# Patient Record
Sex: Female | Born: 1970
Health system: Southern US, Community
[De-identification: ages and names within clinical notes are randomized; demographics above are authoritative.]

## PROBLEM LIST (undated history)

## (undated) DIAGNOSIS — Z8711 Personal history of peptic ulcer disease: Secondary | ICD-10-CM

## (undated) DIAGNOSIS — I10 Essential (primary) hypertension: Secondary | ICD-10-CM

## (undated) DIAGNOSIS — N920 Excessive and frequent menstruation with regular cycle: Secondary | ICD-10-CM

## (undated) DIAGNOSIS — G43909 Migraine, unspecified, not intractable, without status migrainosus: Secondary | ICD-10-CM

## (undated) DIAGNOSIS — R7303 Prediabetes: Secondary | ICD-10-CM

## (undated) DIAGNOSIS — F419 Anxiety disorder, unspecified: Secondary | ICD-10-CM

## (undated) DIAGNOSIS — F32A Depression, unspecified: Secondary | ICD-10-CM

## (undated) DIAGNOSIS — E039 Hypothyroidism, unspecified: Secondary | ICD-10-CM

## (undated) DIAGNOSIS — K259 Gastric ulcer, unspecified as acute or chronic, without hemorrhage or perforation: Secondary | ICD-10-CM

## (undated) DIAGNOSIS — Z973 Presence of spectacles and contact lenses: Secondary | ICD-10-CM

## (undated) DIAGNOSIS — E785 Hyperlipidemia, unspecified: Secondary | ICD-10-CM

## (undated) DIAGNOSIS — R030 Elevated blood-pressure reading, without diagnosis of hypertension: Secondary | ICD-10-CM

## (undated) DIAGNOSIS — R351 Nocturia: Secondary | ICD-10-CM

## (undated) DIAGNOSIS — F329 Major depressive disorder, single episode, unspecified: Secondary | ICD-10-CM

## (undated) DIAGNOSIS — K219 Gastro-esophageal reflux disease without esophagitis: Secondary | ICD-10-CM

## (undated) HISTORY — DX: Essential (primary) hypertension: I10

## (undated) HISTORY — DX: Anxiety disorder, unspecified: F41.9

## (undated) HISTORY — PX: CHOLECYSTECTOMY: SHX55

## (undated) HISTORY — PX: TUBAL LIGATION: SHX77

## (undated) HISTORY — DX: Hyperlipidemia, unspecified: E78.5

## (undated) HISTORY — PX: EYE SURGERY: SHX253

## (undated) HISTORY — PX: ABDOMINAL HYSTERECTOMY: SHX81

## (undated) HISTORY — DX: Depression, unspecified: F32.A

## (undated) HISTORY — DX: Major depressive disorder, single episode, unspecified: F32.9

## (undated) HISTORY — DX: Gastro-esophageal reflux disease without esophagitis: K21.9

## (undated) HISTORY — DX: Migraine, unspecified, not intractable, without status migrainosus: G43.909

---

## 2003-05-08 ENCOUNTER — Emergency Department (HOSPITAL_COMMUNITY): Admission: EM | Admit: 2003-05-08 | Discharge: 2003-05-08 | Payer: Self-pay | Admitting: Emergency Medicine

## 2003-05-08 ENCOUNTER — Encounter: Payer: Self-pay | Admitting: Emergency Medicine

## 2006-01-22 ENCOUNTER — Other Ambulatory Visit: Admission: RE | Admit: 2006-01-22 | Discharge: 2006-01-22 | Payer: Self-pay | Admitting: Obstetrics & Gynecology

## 2011-12-11 HISTORY — PX: WISDOM TOOTH EXTRACTION: SHX21

## 2012-02-28 ENCOUNTER — Other Ambulatory Visit: Payer: Self-pay | Admitting: Obstetrics & Gynecology

## 2012-02-28 DIAGNOSIS — R928 Other abnormal and inconclusive findings on diagnostic imaging of breast: Secondary | ICD-10-CM

## 2012-02-29 ENCOUNTER — Other Ambulatory Visit: Payer: Self-pay | Admitting: Gynecology

## 2012-02-29 DIAGNOSIS — R928 Other abnormal and inconclusive findings on diagnostic imaging of breast: Secondary | ICD-10-CM

## 2012-03-05 ENCOUNTER — Other Ambulatory Visit: Payer: Self-pay

## 2012-03-05 ENCOUNTER — Ambulatory Visit
Admission: RE | Admit: 2012-03-05 | Discharge: 2012-03-05 | Disposition: A | Discharge status: Home / Self Care | Payer: Federal, State, Local not specified - PPO | Source: Ambulatory Visit | Attending: Obstetrics & Gynecology | Admitting: Obstetrics & Gynecology

## 2012-03-05 DIAGNOSIS — R928 Other abnormal and inconclusive findings on diagnostic imaging of breast: Secondary | ICD-10-CM

## 2014-02-19 ENCOUNTER — Ambulatory Visit (INDEPENDENT_AMBULATORY_CARE_PROVIDER_SITE_OTHER): Payer: Federal, State, Local not specified - PPO | Admitting: Internal Medicine

## 2014-02-19 ENCOUNTER — Encounter: Payer: Self-pay | Admitting: Internal Medicine

## 2014-02-19 VITALS — BP 132/94 | HR 76 | Temp 98.1°F | Ht 64.0 in | Wt 208.0 lb

## 2014-02-19 DIAGNOSIS — F419 Anxiety disorder, unspecified: Secondary | ICD-10-CM

## 2014-02-19 DIAGNOSIS — F32A Depression, unspecified: Secondary | ICD-10-CM | POA: Insufficient documentation

## 2014-02-19 DIAGNOSIS — E039 Hypothyroidism, unspecified: Secondary | ICD-10-CM

## 2014-02-19 DIAGNOSIS — F329 Major depressive disorder, single episode, unspecified: Secondary | ICD-10-CM | POA: Insufficient documentation

## 2014-02-19 DIAGNOSIS — F411 Generalized anxiety disorder: Secondary | ICD-10-CM

## 2014-02-19 DIAGNOSIS — K219 Gastro-esophageal reflux disease without esophagitis: Secondary | ICD-10-CM

## 2014-02-19 MED ORDER — ALPRAZOLAM 0.5 MG PO TABS: 0.5000 mg | ORAL_TABLET | Freq: Four times a day (QID) | ORAL | Status: DC | PRN

## 2014-02-19 MED ORDER — OMEPRAZOLE 20 MG PO CPDR: 20.0000 mg | DELAYED_RELEASE_CAPSULE | Freq: Every day | ORAL | Status: DC

## 2014-02-19 MED ORDER — LEVOTHYROXINE SODIUM 50 MCG PO TABS: 75.0000 ug | ORAL_TABLET | Freq: Every day | ORAL | Status: DC

## 2014-02-19 MED ORDER — FLUOXETINE HCL 20 MG PO TABS: 20.0000 mg | ORAL_TABLET | Freq: Every day | ORAL | Status: DC

## 2014-02-19 NOTE — Assessment & Plan Note (Signed)
Stress related Does have history of stress related ulcers Will try prilosec daily

## 2014-02-19 NOTE — Progress Notes (Signed)
Pre visit review using our clinic review tool, if applicable. No additional management support is needed unless otherwise documented below in the visit note. 

## 2014-02-19 NOTE — Assessment & Plan Note (Signed)
TSH just checked 12/2013 Will obtain records for review Synthroid reviewed today

## 2014-02-19 NOTE — Progress Notes (Signed)
HPI  Pt presents to the clinic today to establish care. She is transferring care from [facility], Dr. Milly Jakob. She does request medication refills today. She does have some concerns about increasing anxiety. She does take xanax daily but does not like the way it makes her feel groggy. She is wondering if she needs to be on some other type of medications. She was on tofranil in the past with good results.  Flu: never Tetanus: 2013 LMP:02/06/14 Pap Smear: 08/2013 Mammogram: 03/2013 Eye Doctor: as needed Dentist: biannually  Past Medical History  Diagnosis Date  . Chicken pox   . Depression   . Migraines   . Thyroid disease   . Anxiety   . GERD (gastroesophageal reflux disease)   . Hypertension   . Hyperlipidemia   . History of stomach ulcers     Current Outpatient Prescriptions  Medication Sig Dispense Refill  . ALPRAZolam (XANAX) 0.5 MG tablet Take 0.5 mg by mouth 4 (four) times daily as needed for anxiety.      . fluticasone (FLONASE) 50 MCG/ACT nasal spray Place 2 sprays into both nostrils daily.      Marland Kitchen levothyroxine (SYNTHROID, LEVOTHROID) 50 MCG tablet Take 75 mcg by mouth daily before breakfast. 1 and 1/2 tablets daily      . mometasone-formoterol (DULERA) 200-5 MCG/ACT AERO Inhale 2 puffs into the lungs 2 (two) times daily as needed for wheezing.      . norethindrone (MICRONOR,CAMILA,ERRIN) 0.35 MG tablet Take 1 tablet by mouth daily.       No current facility-administered medications for this visit.    Allergies  Allergen Reactions  . Azithromycin Hives    Family History  Problem Relation Age of Onset  . Mental illness Mother   . Arthritis Father   . Hyperlipidemia Father   . Hypertension Father   . Diabetes Father   . Arthritis Maternal Grandmother   . Diabetes Maternal Grandmother   . Arthritis Maternal Grandfather   . Diabetes Maternal Grandfather   . Arthritis Paternal Grandmother   . Breast cancer Paternal Grandmother     History   Social History   . Marital Status: Married    Spouse Name: N/A    Number of Children: N/A  . Years of Education: N/A   Occupational History  . Not on file.   Social History Main Topics  . Smoking status: Never Smoker   . Smokeless tobacco: Not on file  . Alcohol Use: Yes     Comment: occasional  . Drug Use: No  . Sexual Activity: Not on file   Other Topics Concern  . Not on file   Social History Narrative  . No narrative on file    ROS:  Constitutional: Denies fever, malaise, fatigue, headache or abrupt weight changes.  Respiratory: Denies difficulty breathing, shortness of breath, cough or sputum production.   Cardiovascular: Denies chest pain, chest tightness, palpitations or swelling in the hands or feet.  Gastrointestinal: Pt reports reflux. Denies abdominal pain, bloating, constipation, diarrhea or blood in the stool.  Psych: Pt reports anxiety. Denies depression, SI/HI.  No other specific complaints in a complete review of systems (except as listed in HPI above).  PE:  BP 132/94  Pulse 76  Temp(Src) 98.1 F (36.7 C) (Oral)  Ht 5\' 4"  (1.626 m)  Wt 208 lb (94.348 kg)  BMI 35.69 kg/m2  SpO2 98%  LMP 02/06/2014 Wt Readings from Last 3 Encounters:  02/19/14 208 lb (94.348 kg)    General:  Appears her stated age, overweight but well developed, well nourished in NAD. Cardiovascular: Normal rate and rhythm. S1,S2 noted.  No murmur, rubs or gallops noted. No JVD or BLE edema. No carotid bruits noted. Pulmonary/Chest: Normal effort and positive vesicular breath sounds. No respiratory distress. No wheezes, rales or ronchi noted.  Abdomen: Soft and nontender. Normal bowel sounds, no bruits noted. No distention or masses noted. Liver, spleen and kidneys non palpable. Psychiatric: Mood tearful and affect normal. Behavior is normal. Judgment and thought content normal.      Assessment and Plan:

## 2014-02-19 NOTE — Assessment & Plan Note (Signed)
Seems to be getting worse Needing to take xanax daily Will trial prozac daily  RTC in 6 weeks to follow up anxiety

## 2014-02-19 NOTE — Patient Instructions (Addendum)

## 2014-03-23 ENCOUNTER — Other Ambulatory Visit: Payer: Self-pay | Admitting: *Deleted

## 2014-03-23 ENCOUNTER — Encounter: Payer: Self-pay | Admitting: Podiatry

## 2014-03-23 MED ORDER — NAPROXEN SODIUM 550 MG PO TABS: 550.0000 mg | ORAL_TABLET | Freq: Two times a day (BID) | ORAL | Status: DC

## 2014-03-23 NOTE — Telephone Encounter (Signed)
[facility] faxed request for naproxen sodium 550 mg tab #60 with 1 refill. Per dr Milinda Pointer refill rx.

## 2014-04-02 ENCOUNTER — Encounter: Payer: Self-pay | Admitting: Internal Medicine

## 2014-04-02 ENCOUNTER — Ambulatory Visit (INDEPENDENT_AMBULATORY_CARE_PROVIDER_SITE_OTHER): Payer: Federal, State, Local not specified - PPO | Admitting: Internal Medicine

## 2014-04-02 VITALS — BP 120/80 | HR 70 | Temp 98.2°F | Wt 204.0 lb

## 2014-04-02 DIAGNOSIS — F411 Generalized anxiety disorder: Secondary | ICD-10-CM

## 2014-04-02 DIAGNOSIS — G4700 Insomnia, unspecified: Secondary | ICD-10-CM | POA: Insufficient documentation

## 2014-04-02 DIAGNOSIS — E039 Hypothyroidism, unspecified: Secondary | ICD-10-CM

## 2014-04-02 MED ORDER — FLUOXETINE HCL 20 MG PO TABS: 20.0000 mg | ORAL_TABLET | Freq: Every day | ORAL | Status: DC

## 2014-04-02 MED ORDER — TRAZODONE HCL 50 MG PO TABS: 25.0000 mg | ORAL_TABLET | Freq: Every evening | ORAL | Status: DC | PRN

## 2014-04-02 NOTE — Assessment & Plan Note (Signed)
Well controlled on prozac Infrequent xanax use Medication refilled today

## 2014-04-02 NOTE — Patient Instructions (Addendum)

## 2014-04-02 NOTE — Progress Notes (Signed)
Subjective:    Patient ID: Anne Mcguire, female    DOB: 29-Sep-1971, 43 y.o.   MRN: [id number]  HPI  Pt presents to the clinic today for 6 week followup for anxiety. She was previously on xanax, but did not like the way it made her feel. She felt groggy. She was started on prozac daily and instructed to use the xanax as needed. She reports that she feels better. She feels more relaxed and less irritable. She is having some issues sleeping. She falls asleep fine but has trouble staying asleep. She has tried [place] in the past and does not want to try that again.  Additionally, she is do to have her thyroid level checked. Her dose was adjusted in December to 75 mcg daily and she has not had it checked since that time.  Review of Systems      Past Medical History  Diagnosis Date  . Chicken pox   . Depression   . Migraines   . Thyroid disease   . Anxiety   . GERD (gastroesophageal reflux disease)   . Hypertension   . Hyperlipidemia   . History of stomach ulcers     Current Outpatient Prescriptions  Medication Sig Dispense Refill  . ALPRAZolam (XANAX) 0.5 MG tablet Take 1 tablet (0.5 mg total) by mouth 4 (four) times daily as needed for anxiety.  30 tablet  0  . FLUoxetine (PROZAC) 20 MG tablet Take 1 tablet (20 mg total) by mouth daily.  30 tablet  1  . fluticasone (FLONASE) 50 MCG/ACT nasal spray Place 2 sprays into both nostrils daily.      Marland Kitchen levothyroxine (SYNTHROID, LEVOTHROID) 50 MCG tablet Take 1.5 tablets (75 mcg total) by mouth daily before breakfast. 1 and 1/2 tablets daily  135 tablet  1  . mometasone-formoterol (DULERA) 200-5 MCG/ACT AERO Inhale 2 puffs into the lungs 2 (two) times daily as needed for wheezing.      . naproxen sodium (ANAPROX) 550 MG tablet Take 1 tablet (550 mg total) by mouth 2 (two) times daily with a meal.  60 tablet  1  . norethindrone (MICRONOR,CAMILA,ERRIN) 0.35 MG tablet Take 1 tablet by mouth daily.      Marland Kitchen omeprazole (PRILOSEC) 20 MG capsule  Take 1 capsule (20 mg total) by mouth daily.  30 capsule  3   No current facility-administered medications for this visit.    Allergies  Allergen Reactions  . Azithromycin Hives    Family History  Problem Relation Age of Onset  . Mental illness Mother   . Arthritis Father   . Hyperlipidemia Father   . Hypertension Father   . Diabetes Father   . Arthritis Maternal Grandmother   . Diabetes Maternal Grandmother   . Arthritis Maternal Grandfather   . Diabetes Maternal Grandfather   . Arthritis Paternal Grandmother   . Breast cancer Paternal Grandmother     History   Social History  . Marital Status: Married    Spouse Name: N/A    Number of Children: N/A  . Years of Education: N/A   Occupational History  . Not on file.   Social History Main Topics  . Smoking status: Never Smoker   . Smokeless tobacco: Not on file  . Alcohol Use: Yes     Comment: occasional  . Drug Use: No  . Sexual Activity: Yes   Other Topics Concern  . Not on file   Social History Narrative  . No narrative on file  Constitutional: Denies fever, malaise, fatigue, headache or abrupt weight changes.  Psych: Pt reports insomnia, anxiety and depression. Denies SI/HI.  No other specific complaints in a complete review of systems (except as listed in HPI above).  Objective:   Physical Exam   BP 120/80  Pulse 70  Temp(Src) 98.2 F (36.8 C) (Oral)  Wt 204 lb (92.534 kg)  SpO2 98% Wt Readings from Last 3 Encounters:  04/02/14 204 lb (92.534 kg)  02/19/14 208 lb (94.348 kg)    General: Appears her stated age, well developed, well nourished in NAD. Cardiovascular: Normal rate and rhythm. S1,S2 noted.  No murmur, rubs or gallops noted. No JVD or BLE edema. No carotid bruits noted. Pulmonary/Chest: Normal effort and positive vesicular breath sounds. No respiratory distress. No wheezes, rales or ronchi noted.  Psychiatric: Mood and affect normal. Behavior is normal. Judgment and thought  content normal.         Assessment & Plan:

## 2014-04-02 NOTE — Assessment & Plan Note (Signed)
Will recheck TSH today

## 2014-04-02 NOTE — Progress Notes (Signed)
Pre visit review using our clinic review tool, if applicable. No additional management support is needed unless otherwise documented below in the visit note. 

## 2014-04-02 NOTE — Assessment & Plan Note (Signed)
Will try low dose trazadone

## 2014-04-05 LAB — TSH: TSH: 0.61 u[IU]/mL (ref 0.35–5.50)

## 2014-04-13 ENCOUNTER — Other Ambulatory Visit: Payer: Self-pay | Admitting: Internal Medicine

## 2014-04-29 ENCOUNTER — Other Ambulatory Visit: Payer: Self-pay | Admitting: Internal Medicine

## 2014-04-29 NOTE — Telephone Encounter (Signed)
Last filled 03/30/14--please advise

## 2014-05-24 ENCOUNTER — Other Ambulatory Visit: Payer: Self-pay | Admitting: Internal Medicine

## 2014-05-25 NOTE — Telephone Encounter (Signed)
Last filled 04/29/14.  Ok to refill?

## 2014-06-16 ENCOUNTER — Other Ambulatory Visit: Payer: Self-pay | Admitting: Internal Medicine

## 2014-08-10 ENCOUNTER — Other Ambulatory Visit: Payer: Self-pay | Admitting: Internal Medicine

## 2014-09-02 ENCOUNTER — Other Ambulatory Visit: Payer: Self-pay | Admitting: Internal Medicine

## 2014-09-02 NOTE — Telephone Encounter (Signed)
Last filled 05/25/14 with 2 refills-- pt has f/u appt 10/15--please advise

## 2014-09-18 ENCOUNTER — Other Ambulatory Visit: Payer: Self-pay | Admitting: Internal Medicine

## 2014-09-20 NOTE — Telephone Encounter (Signed)
Pt has an upcoming appt with you but it seems she should have ran out already--unless she had leftover--appt scheduled 10/04/14--please advise if okay to refill before f/u appt

## 2014-10-04 ENCOUNTER — Ambulatory Visit (INDEPENDENT_AMBULATORY_CARE_PROVIDER_SITE_OTHER): Payer: Federal, State, Local not specified - PPO | Admitting: Internal Medicine

## 2014-10-04 ENCOUNTER — Encounter: Payer: Self-pay | Admitting: Internal Medicine

## 2014-10-04 VITALS — BP 138/86 | HR 81 | Temp 98.6°F | Wt 211.0 lb

## 2014-10-04 DIAGNOSIS — E039 Hypothyroidism, unspecified: Secondary | ICD-10-CM

## 2014-10-04 DIAGNOSIS — Z23 Encounter for immunization: Secondary | ICD-10-CM

## 2014-10-04 DIAGNOSIS — F411 Generalized anxiety disorder: Secondary | ICD-10-CM

## 2014-10-04 DIAGNOSIS — G4700 Insomnia, unspecified: Secondary | ICD-10-CM

## 2014-10-04 DIAGNOSIS — R5383 Other fatigue: Secondary | ICD-10-CM

## 2014-10-04 LAB — VITAMIN D 25 HYDROXY (VIT D DEFICIENCY, FRACTURES): VITD: 26.22 ng/mL — ABNORMAL LOW (ref 30.00–100.00)

## 2014-10-04 LAB — COMPREHENSIVE METABOLIC PANEL
ALT: 13 U/L (ref 0–35)
AST: 17 U/L (ref 0–37)
Albumin: 3.6 g/dL (ref 3.5–5.2)
Alkaline Phosphatase: 51 U/L (ref 39–117)
BUN: 12 mg/dL (ref 6–23)
CO2: 32 mEq/L (ref 19–32)
Calcium: 8.9 mg/dL (ref 8.4–10.5)
Chloride: 102 mEq/L (ref 96–112)
Creatinine, Ser: 0.9 mg/dL (ref 0.4–1.2)
GFR: 77.7 mL/min (ref >60.00)
Glucose, Bld: 93 mg/dL (ref 70–99)
Potassium: 3.9 mEq/L (ref 3.5–5.1)
Sodium: 138 mEq/L (ref 135–145)
Total Bilirubin: 0.4 mg/dL (ref 0.2–1.2)
Total Protein: 7.3 g/dL (ref 6.0–8.3)

## 2014-10-04 LAB — TSH: TSH: 0.55 u[IU]/mL (ref 0.35–4.50)

## 2014-10-04 LAB — VITAMIN B12: Vitamin B-12: 360 pg/mL (ref 211–911)

## 2014-10-04 LAB — CBC
HCT: 41.8 % (ref 36.0–46.0)
Hemoglobin: 13.7 g/dL (ref 12.0–15.0)
MCHC: 32.7 g/dL (ref 30.0–36.0)
MCV: 90.3 fl (ref 78.0–100.0)
Platelets: 164 10*3/uL (ref 150.0–400.0)
RBC: 4.63 Mil/uL (ref 3.87–5.11)
RDW: 14.5 % (ref 11.5–15.5)
WBC: 7.3 10*3/uL (ref 4.0–10.5)

## 2014-10-04 LAB — T4, FREE: Free T4: 1.08 ng/dL (ref 0.60–1.60)

## 2014-10-04 NOTE — Progress Notes (Signed)
Subjective:    Patient ID: Anne Mcguire, female    DOB: 1971-11-11, 43 y.o.   MRN: [id number]  HPI  Pt presents to the clinic today for 6 month follow.  Hypothyroidism: She reports that she has been taking 100 mcg daily instead of the 75 mcg prescribed. She has been doing this for the last 2 months. She has been more fatigued and noted more hair loss.  Insomnia: She reports that the trazadone is working well for her.  Anxiety: Controlled on prozac. She reports that she has not had to used the xanax .  Additionally, she reports some pain on the right side of her tongue. She noticed this 2 days ago. It is tender. She denies fever, chills or URI symptoms. She has not tried anything OTC.   Review of Systems      Past Medical History  Diagnosis Date  . Chicken pox   . Depression   . Migraines   . Thyroid disease   . Anxiety   . GERD (gastroesophageal reflux disease)   . Hypertension   . Hyperlipidemia   . History of stomach ulcers     Current Outpatient Prescriptions  Medication Sig Dispense Refill  . FLUoxetine (PROZAC) 20 MG tablet TAKE 1 TABLET BY MOUTH EVERY DAY  30 tablet  4  . fluticasone (FLONASE) 50 MCG/ACT nasal spray Place 2 sprays into both nostrils daily.      Marland Kitchen levothyroxine (SYNTHROID, LEVOTHROID) 50 MCG tablet TAKE 1.5 TABLETS (75 MCG TOTAL) BY MOUTH DAILY BEFORE BREAKFAST.  135 tablet  0  . naproxen sodium (ANAPROX) 550 MG tablet Take 1 tablet (550 mg total) by mouth 2 (two) times daily with a meal.  60 tablet  1  . norethindrone (MICRONOR,CAMILA,ERRIN) 0.35 MG tablet Take 1 tablet by mouth daily.      Marland Kitchen omeprazole (PRILOSEC) 20 MG capsule TAKE 1 CAPSULE (20 MG TOTAL) BY MOUTH DAILY.  30 capsule  3  . traZODone (DESYREL) 50 MG tablet TAKE 1/2 TO 1 TABLET BY MOUTH AT BEDTIME AS NEEDED FOR SLEEP  30 tablet  2   No current facility-administered medications for this visit.    Allergies  Allergen Reactions  . Azithromycin Hives    Family History    Problem Relation Age of Onset  . Mental illness Mother   . Arthritis Father   . Hyperlipidemia Father   . Hypertension Father   . Diabetes Father   . Arthritis Maternal Grandmother   . Diabetes Maternal Grandmother   . Arthritis Maternal Grandfather   . Diabetes Maternal Grandfather   . Arthritis Paternal Grandmother   . Breast cancer Paternal Grandmother     History   Social History  . Marital Status: Married    Spouse Name: N/A    Number of Children: N/A  . Years of Education: N/A   Occupational History  . Not on file.   Social History Main Topics  . Smoking status: Never Smoker   . Smokeless tobacco: Not on file  . Alcohol Use: Yes     Comment: occasional  . Drug Use: No  . Sexual Activity: Yes   Other Topics Concern  . Not on file   Social History Narrative  . No narrative on file     Constitutional: Pt reports fatigue. Denies fever, malaise, headache or abrupt weight changes.  HEENT: Pt reports ulcer in mouth. Denies eye pain, eye redness, ear pain, ringing in the ears, wax buildup, runny nose, nasal congestion,  bloody nose, or sore throat. Respiratory: Denies difficulty breathing, shortness of breath, cough or sputum production.   Cardiovascular: Denies chest pain, chest tightness, palpitations or swelling in the hands or feet.  Neurological: Denies dizziness, difficulty with memory, difficulty with speech or problems with balance and coordination.   No other specific complaints in a complete review of systems (except as listed in HPI above).  Objective:   Physical Exam   BP 138/86  Pulse 81  Temp(Src) 98.6 F (37 C) (Oral)  Wt 211 lb (95.709 kg)  SpO2 98% Wt Readings from Last 3 Encounters:  10/04/14 211 lb (95.709 kg)  04/02/14 204 lb (92.534 kg)  02/19/14 208 lb (94.348 kg)    General: Appears her stated age, obese but well developed, well nourished in NAD. Skin: Warm, dry and intact. No rashes, lesions or ulcerations noted. HEENT:  Throat/Mouth: Teeth present, mucosa pink and moist, no exudate noted. 2 < 1 cm annular ulcerations noted on right lateral surface of tongue. No adenopathy noted. Cardiovascular: Normal rate and rhythm. S1,S2 noted.  No murmur, rubs or gallops noted.  Pulmonary/Chest: Normal effort and positive vesicular breath sounds. No respiratory distress. No wheezes, rales or ronchi noted.  Neurological: Alert and oriented.      Assessment & Plan:   Oral ulcers:  She declines RX for viscous lidocaine Advised her to gargle with salt water TID and prn Reassured her that this is self limiting and should heal within 5-7 days She does have an appt with her dentist in 1 week, will discuss with him if not resolved  Fatigue:  Checking a thyroid panel- will adjust if needed Will check CBC, B12 and Vit D as well

## 2014-10-04 NOTE — Patient Instructions (Addendum)
Hypothyroidism The thyroid is a large gland located in the lower front of your neck. The thyroid gland helps control metabolism. Metabolism is how your body handles food. It controls metabolism with the hormone thyroxine. When this gland is underactive (hypothyroid), it produces too little hormone.  CAUSES These include:   Absence or destruction of thyroid tissue.  Goiter due to iodine deficiency.  Goiter due to medications.  Congenital defects (since birth).  Problems with the pituitary. This causes a lack of TSH (thyroid stimulating hormone). This hormone tells the thyroid to turn out more hormone. SYMPTOMS  Lethargy (feeling as though you have no energy)  Cold intolerance  Weight gain (in spite of normal food intake)  Dry skin  Coarse hair  Menstrual irregularity (if severe, may lead to infertility)  Slowing of thought processes Cardiac problems are also caused by insufficient amounts of thyroid hormone. Hypothyroidism in the newborn is cretinism, and is an extreme form. It is important that this form be treated adequately and immediately or it will lead rapidly to retarded physical and mental development. DIAGNOSIS  To prove hypothyroidism, your caregiver may do blood tests and ultrasound tests. Sometimes the signs are hidden. It may be necessary for your caregiver to watch this illness with blood tests either before or after diagnosis and treatment. TREATMENT  Low levels of thyroid hormone are increased by using synthetic thyroid hormone. This is a safe, effective treatment. It usually takes about four weeks to gain the full effects of the medication. After you have the full effect of the medication, it will generally take another four weeks for problems to leave. Your caregiver may start you on low doses. If you have had heart problems the dose may be gradually increased. It is generally not an emergency to get rapidly to normal. HOME CARE INSTRUCTIONS   Take your  medications as your caregiver suggests. Let your caregiver know of any medications you are taking or start taking. Your caregiver will help you with dosage schedules.  As your condition improves, your dosage needs may increase. It will be necessary to have continuing blood tests as suggested by your caregiver.  Report all suspected medication side effects to your caregiver. SEEK MEDICAL CARE IF: Seek medical care if you develop:  Sweating.  Tremulousness (tremors).  Anxiety.  Rapid weight loss.  Heat intolerance.  Emotional swings.  Diarrhea.  Weakness. SEEK IMMEDIATE MEDICAL CARE IF:  You develop chest pain, an irregular heart beat (palpitations), or a rapid heart beat. MAKE SURE YOU:   Understand these instructions.  Will watch your condition.  Will get help right away if you are not doing well or get worse. Document Released: 11/26/2005 Document Revised: 02/18/2012 Document Reviewed: 07/16/2008 ExitCare Patient Information 2015 [employer]. This information is not intended to replace advice given to you by your health care provider. Make sure you discuss any questions you have with your health care provider.  

## 2014-10-04 NOTE — Assessment & Plan Note (Signed)
Improved on trazadone Will continue current dose at this time

## 2014-10-04 NOTE — Assessment & Plan Note (Signed)
Stable on prozac Does not use xanax Will check CMET today

## 2014-10-04 NOTE — Assessment & Plan Note (Signed)
Has been taking 100 mcg of synthroid Will repeat TSH and free T4 today If normal, will call in 100 mcg capsules with 5 refills If abnormal, will adjust as needed

## 2014-10-04 NOTE — Progress Notes (Signed)
Pre visit review using our clinic review tool, if applicable. No additional management support is needed unless otherwise documented below in the visit note. 

## 2014-10-08 MED ORDER — LEVOTHYROXINE SODIUM 100 MCG PO TABS: 100.0000 ug | ORAL_TABLET | Freq: Every day | ORAL | Status: DC

## 2014-10-08 NOTE — Addendum Note (Signed)
Addended by: Lurlean Nanny on: 10/08/2014 09:10 AM   Modules accepted: Orders

## 2014-10-13 ENCOUNTER — Telehealth: Payer: Self-pay | Admitting: *Deleted

## 2014-10-13 ENCOUNTER — Other Ambulatory Visit: Payer: Self-pay

## 2014-10-13 MED ORDER — OMEPRAZOLE 20 MG PO CPDR: 20.0000 mg | DELAYED_RELEASE_CAPSULE | Freq: Every day | ORAL | Status: DC

## 2014-10-13 NOTE — Telephone Encounter (Signed)
Left msg on triage stating have been trying to get pt omeprazole filled. Has fax twice no response back from office.../lmb  Pt last saw [facility] @ [place]. Forwarding msg to [place]...Johny Chess

## 2014-10-13 NOTE — Telephone Encounter (Signed)
OK to send in omeprazole

## 2014-10-15 NOTE — Telephone Encounter (Signed)
Rx sent through [facility]  

## 2014-11-12 ENCOUNTER — Other Ambulatory Visit: Payer: Self-pay | Admitting: Internal Medicine

## 2014-11-12 ENCOUNTER — Other Ambulatory Visit: Payer: Self-pay

## 2014-11-12 MED ORDER — TRAZODONE HCL 50 MG PO TABS: 25.0000 mg | ORAL_TABLET | Freq: Every evening | ORAL | Status: DC | PRN

## 2014-11-12 NOTE — Telephone Encounter (Signed)
Last filled 09/03/14 with 2 refills please advise

## 2014-11-12 NOTE — Telephone Encounter (Signed)
Last filled 09/04/2014--last OV 09/2014--please advise

## 2015-02-09 ENCOUNTER — Other Ambulatory Visit: Payer: Self-pay | Admitting: Internal Medicine

## 2015-03-07 ENCOUNTER — Ambulatory Visit (INDEPENDENT_AMBULATORY_CARE_PROVIDER_SITE_OTHER): Payer: Federal, State, Local not specified - PPO | Admitting: Family Medicine

## 2015-03-07 ENCOUNTER — Encounter: Payer: Self-pay | Admitting: Family Medicine

## 2015-03-07 VITALS — BP 152/100 | HR 81 | Temp 98.0°F | Ht 64.0 in | Wt 222.8 lb

## 2015-03-07 DIAGNOSIS — R300 Dysuria: Secondary | ICD-10-CM

## 2015-03-07 DIAGNOSIS — IMO0001 Reserved for inherently not codable concepts without codable children: Secondary | ICD-10-CM

## 2015-03-07 DIAGNOSIS — N12 Tubulo-interstitial nephritis, not specified as acute or chronic: Secondary | ICD-10-CM | POA: Diagnosis not present

## 2015-03-07 DIAGNOSIS — R350 Frequency of micturition: Secondary | ICD-10-CM

## 2015-03-07 LAB — POCT URINALYSIS DIPSTICK
Bilirubin, UA: NEGATIVE
Blood, UA: NEGATIVE
Glucose, UA: NEGATIVE
Ketones, UA: NEGATIVE
Leukocytes, UA: NEGATIVE
Nitrite, UA: POSITIVE
Protein, UA: NEGATIVE
Spec Grav, UA: 1.025
Urobilinogen, UA: 2
pH, UA: 6

## 2015-03-07 MED ORDER — CIPROFLOXACIN HCL 500 MG PO TABS: 500.0000 mg | ORAL_TABLET | Freq: Two times a day (BID) | ORAL | Status: DC

## 2015-03-07 NOTE — Progress Notes (Signed)
Pre visit review using our clinic review tool, if applicable. No additional management support is needed unless otherwise documented below in the visit note. 

## 2015-03-07 NOTE — Progress Notes (Signed)
Dr. Frederico Hamman T. Kariel Skillman, MD, [facility] [place], [address] Phone: [phone] Fax: [phone]  03/07/2015  Patient: Anne Mcguire, MRN: [id number], DOB: Sep 07, 1971, 44 y.o.  Primary Physician:  Webb Silversmith, NP  Chief Complaint: Urinary Tract Infection  Subjective:   This 44 y.o. female patient presents with burning, urgency. No vaginal discharge or external irritation.  No STD exposure. No abd pain, no flank pain.  Urgency x 1 week Back pain now on the left  The PMH, PSH, Social History, Family History, Medications, and allergies have been reviewed in [facility], and have been updated if relevant.  GEN:  no fevers, chills. GI: No n/v/d, eating normally Otherwise, ROS is as per the HPI.  Objective:   Blood pressure 152/100, pulse 81, temperature 98 F (36.7 C), temperature source Oral, height 5\' 4"  (1.626 m), weight 222 lb 12.8 oz (101.061 kg), last menstrual period 02/16/2015, SpO2 95 %.  GEN: WDWN, A&Ox4,NAD. Non-toxic HEENT: Atraumatc, normocephalic. CV: RRR, No M/G/R PULM: CTA B, No wheezes, crackles, or rhonchi ABD: S, NT, ND, +BS, no rebound. + L CVAT. + suprapubic tenderness. EXT: No c/c/e  Objective Data: Results for orders placed or performed in visit on 03/07/15  POCT urinalysis dipstick  Result Value Ref Range   Color, UA Orange    Clarity, UA clear    Glucose, UA negative    Bilirubin, UA negative    Ketones, UA negative    Spec Grav, UA 1.025    Blood, UA negative    pH, UA 6.0    Protein, UA negative    Urobilinogen, UA 2.0    Nitrite, UA positive    Leukocytes, UA Negative     Assessment and Plan:   Pyelonephritis - Plan: ciprofloxacin (CIPRO) 500 MG tablet, Urine culture  Frequency - Plan: POCT urinalysis dipstick  Burning with urination - Plan: POCT urinalysis dipstick  Probable early pyelo  Follow-up: Return if symptoms worsen or fail to improve.  New Prescriptions     CIPROFLOXACIN (CIPRO) 500 MG TABLET    Take 1 tablet (500 mg total) by mouth 2 (two) times daily.   Orders Placed This Encounter  Procedures  . Urine culture  . POCT urinalysis dipstick    Signed,  Frederico Hamman T. Aliz Meritt, MD   Patient's Medications  New Prescriptions   CIPROFLOXACIN (CIPRO) 500 MG TABLET    Take 1 tablet (500 mg total) by mouth 2 (two) times daily.  Previous Medications   B COMPLEX-C (SUPER B COMPLEX/VITAMIN C PO)    Take 1 capsule by mouth daily.   FERROUS GLUCONATE (FERGON) 240 (27 FE) MG TABLET    Take 240 mg by mouth daily.   FLUOXETINE (PROZAC) 20 MG TABLET    TAKE 1 TABLET BY MOUTH EVERY DAY   FLUTICASONE (FLONASE) 50 MCG/ACT NASAL SPRAY    Place 2 sprays into both nostrils daily.   LEVOTHYROXINE (SYNTHROID, LEVOTHROID) 100 MCG TABLET    Take 1 tablet (100 mcg total) by mouth daily before breakfast.   NAPROXEN SODIUM (ANAPROX) 550 MG TABLET    Take 1 tablet (550 mg total) by mouth 2 (two) times daily with a meal.   NORETHINDRONE (MICRONOR,CAMILA,ERRIN) 0.35 MG TABLET    Take 1 tablet by mouth daily.   OMEPRAZOLE (PRILOSEC) 20 MG CAPSULE    Take 1 capsule (20 mg total) by mouth daily.   TRAZODONE (DESYREL) 50 MG TABLET    TAKE 1/2 TO  1 TABLET BY MOUTH AT BEDTIME AS NEEDED FOR SLEEP  Modified Medications   No medications on file  Discontinued Medications   No medications on file

## 2015-03-08 ENCOUNTER — Telehealth: Payer: Self-pay | Admitting: Internal Medicine

## 2015-03-08 LAB — URINE CULTURE
Colony Count: NO GROWTH
Organism ID, Bacteria: NO GROWTH

## 2015-03-08 NOTE — Telephone Encounter (Signed)
Patient Name: Anne Mcguire  DOB: 23-May-1971    Initial Comment Caller states she has an UTI yesterday and today she has some pain and swelling in the groin area.    Nurse Assessment  Nurse: Thad Ranger RN, Denise Date/Time (Eastern Time): 03/08/2015 4:11:08 PM  Confirm and document reason for call. If symptomatic, describe symptoms. ---Caller states she has a UTI and was seen at [facility] yesterday and placed on Cipro. States today she has some pain and swelling femoral lymph node on the right side. NO fever and has had a total of Cipro x 3 doses. Has not taken Ibuprophen.  Has the patient traveled out of the country within the last 30 days? ---Not Applicable  Does the patient require triage? ---Yes  Related visit to physician within the last 2 weeks? ---Yes  Does the PT have any chronic conditions? (i.e. diabetes, asthma, etc.) ---No  Did the patient indicate they were pregnant? ---No     Guidelines    Guideline Title Affirmed Question Affirmed Notes  Lymph Nodes Swollen [1] Very tender to the touch AND [2] no fever Based on RN clinical inst, advised if she is not feeling any better after being on Cipro x 72 hrs for UTI, she is to be seen by MD. Verb understanding.   Final Disposition User   See PCP When Office is Open (within 3 days) Carmon, RN, Langley Gauss    Comments  No appt scheduled yet as she want to wait to see if the Cipro improves her s/s of UTI and Lymph node pain/swelling. States she will cb if her s/s do not improve and will make appt at that time.

## 2015-03-11 ENCOUNTER — Telehealth: Payer: Self-pay | Admitting: *Deleted

## 2015-03-11 NOTE — Telephone Encounter (Signed)
Patient advised.

## 2015-03-11 NOTE — Telephone Encounter (Signed)
It's possible but not at all common.  If mild, no weakness, and not progressive, then okay to continue.   Dr. Lorelei Pont was worried about an infection, and if possible I would try to finish the abx.   If bothersome, then stop the medicine and update [place] in about 2-3 days (and especially let us know if her urinary sx aren't improved).   Thanks.

## 2015-03-11 NOTE — Telephone Encounter (Signed)
Pt calls c/o numbness and tingling pain in legs since starting Cipro 5 days ago. She want's to know if it is a possible side effect?

## 2015-03-14 ENCOUNTER — Encounter: Payer: Self-pay | Admitting: Internal Medicine

## 2015-03-14 ENCOUNTER — Ambulatory Visit (INDEPENDENT_AMBULATORY_CARE_PROVIDER_SITE_OTHER): Payer: Federal, State, Local not specified - PPO | Admitting: Internal Medicine

## 2015-03-14 ENCOUNTER — Ambulatory Visit: Payer: Federal, State, Local not specified - PPO | Admitting: Internal Medicine

## 2015-03-14 VITALS — BP 146/88 | HR 81 | Temp 98.4°F | Wt 222.0 lb

## 2015-03-14 DIAGNOSIS — M545 Low back pain, unspecified: Secondary | ICD-10-CM

## 2015-03-14 DIAGNOSIS — R195 Other fecal abnormalities: Secondary | ICD-10-CM

## 2015-03-14 DIAGNOSIS — R351 Nocturia: Secondary | ICD-10-CM

## 2015-03-14 DIAGNOSIS — R350 Frequency of micturition: Secondary | ICD-10-CM

## 2015-03-14 DIAGNOSIS — T148XXA Other injury of unspecified body region, initial encounter: Secondary | ICD-10-CM

## 2015-03-14 DIAGNOSIS — R3915 Urgency of urination: Secondary | ICD-10-CM | POA: Diagnosis not present

## 2015-03-14 DIAGNOSIS — M7630 Iliotibial band syndrome, unspecified leg: Secondary | ICD-10-CM | POA: Diagnosis not present

## 2015-03-14 DIAGNOSIS — T148 Other injury of unspecified body region: Secondary | ICD-10-CM

## 2015-03-14 LAB — POCT URINALYSIS DIPSTICK
Bilirubin, UA: NEGATIVE
Blood, UA: NEGATIVE
Glucose, UA: NEGATIVE
Ketones, UA: NEGATIVE
Leukocytes, UA: NEGATIVE
Nitrite, UA: NEGATIVE
Protein, UA: NEGATIVE
Spec Grav, UA: 1.02
Urobilinogen, UA: NEGATIVE
pH, UA: 6

## 2015-03-14 MED ORDER — CYCLOBENZAPRINE HCL 5 MG PO TABS: 5.0000 mg | ORAL_TABLET | Freq: Every day | ORAL | Status: DC

## 2015-03-14 MED ORDER — PREDNISONE 10 MG PO TABS: ORAL_TABLET | ORAL | Status: DC

## 2015-03-14 NOTE — Progress Notes (Signed)
Subjective:    Patient ID: Anne Mcguire, female    DOB: 1971-10-18, 44 y.o.   MRN: [id number]  HPI  Pt presents to the clinic today with multiple complaints:  1- She has had urgency, frequency and nocturia x 2-3 months. Last week she developed right side back pain and dysuria in addition to her other symptoms. She was diagnosed with Pyelonephritis and treated with Cipro. She reports her dysuria went away but her back pain did not. Her urine culture showed no growth. She has not had any fever, chills or body aches. She does drink caffeine during the day but only water/juice after 7 pm. She does drink fluids up until the time she goes to bed.  2- Ever since she started on the antibiotic, she has been having pain on the sides of both of her legs. She describes the pain as sore and prickly. She denies numbness or tingling. She denies any injury to the area. She has not tried anything OTC.   3- She also c/o loose stool. This started 2-3 months ago as well. She is having a bowel movement daily but reports she is not emptying all the way. She feels constipated and bloated. She denies reflux but is taking Prilosec daily. She denies blood in her stool. She has tried Milk of Magnesium but it seemed to make her symptoms worse.  Review of Systems      Past Medical History  Diagnosis Date  . Chicken pox   . Depression   . Migraines   . Thyroid disease   . Anxiety   . GERD (gastroesophageal reflux disease)   . Hypertension   . Hyperlipidemia   . History of stomach ulcers     Current Outpatient Prescriptions  Medication Sig Dispense Refill  . B Complex-C (SUPER B COMPLEX/VITAMIN C PO) Take 1 capsule by mouth daily.    . ciprofloxacin (CIPRO) 500 MG tablet Take 1 tablet (500 mg total) by mouth 2 (two) times daily. 14 tablet 0  . ferrous gluconate (FERGON) 240 (27 FE) MG tablet Take 240 mg by mouth daily.    Marland Kitchen FLUoxetine (PROZAC) 20 MG tablet TAKE 1 TABLET BY MOUTH EVERY DAY 30 tablet 1    . fluticasone (FLONASE) 50 MCG/ACT nasal spray Place 2 sprays into both nostrils daily.    Marland Kitchen levothyroxine (SYNTHROID, LEVOTHROID) 100 MCG tablet Take 1 tablet (100 mcg total) by mouth daily before breakfast. 30 tablet 5  . naproxen sodium (ANAPROX) 550 MG tablet Take 1 tablet (550 mg total) by mouth 2 (two) times daily with a meal. 60 tablet 1  . norethindrone (MICRONOR,CAMILA,ERRIN) 0.35 MG tablet Take 1 tablet by mouth daily.    Marland Kitchen omeprazole (PRILOSEC) 20 MG capsule Take 1 capsule (20 mg total) by mouth daily. 30 capsule 5  . traZODone (DESYREL) 50 MG tablet TAKE 1/2 TO 1 TABLET BY MOUTH AT BEDTIME AS NEEDED FOR SLEEP 30 tablet 1   No current facility-administered medications for this visit.    Allergies  Allergen Reactions  . Azithromycin Hives    Family History  Problem Relation Age of Onset  . Mental illness Mother   . Arthritis Father   . Hyperlipidemia Father   . Hypertension Father   . Diabetes Father   . Arthritis Maternal Grandmother   . Diabetes Maternal Grandmother   . Arthritis Maternal Grandfather   . Diabetes Maternal Grandfather   . Arthritis Paternal Grandmother   . Breast cancer Paternal Grandmother  History   Social History  . Marital Status: Married    Spouse Name: N/A  . Number of Children: N/A  . Years of Education: N/A   Occupational History  . Not on file.   Social History Main Topics  . Smoking status: Never Smoker   . Smokeless tobacco: Not on file  . Alcohol Use: Yes     Comment: occasional  . Drug Use: No  . Sexual Activity: Yes   Other Topics Concern  . Not on file   Social History Narrative     Constitutional: Pt reports fatigue. Denies fever, malaise, fatigue, headache or abrupt weight changes.  Respiratory: Denies difficulty breathing, shortness of breath, cough or sputum production.   Cardiovascular: Denies chest pain, chest tightness, palpitations or swelling in the hands or feet.  Gastrointestinal: Pt reports diarrhea  and bloating. Denies abdominal pain, constipation, or blood in the stool.  GU: Pt reports urgency, frequency. Denies pain with urination, burning sensation, blood in urine, odor or discharge. Musculoskeletal: Pt reports back pain. Denies decrease in range of motion, difficulty with gait, or joint pain and swelling.   No other specific complaints in a complete review of systems (except as listed in HPI above).  Objective:   Physical Exam  BP 146/88 mmHg  Pulse 81  Temp(Src) 98.4 F (36.9 C) (Oral)  Wt 222 lb (100.699 kg)  SpO2 98%  LMP 02/16/2015 Wt Readings from Last 3 Encounters:  03/14/15 222 lb (100.699 kg)  03/07/15 222 lb 12.8 oz (101.061 kg)  10/04/14 211 lb (95.709 kg)    General: Appears her stated age, obese in NAD. Skin: Warm, dry and intact. No rashes, lesions or ulcerations noted. Cardiovascular: Normal rate and rhythm. S1,S2 noted.  No murmur, rubs or gallops noted.  Pulmonary/Chest: Normal effort and positive vesicular breath sounds. No respiratory distress. No wheezes, rales or ronchi noted.  Abdomen: Soft and nontender. Normal bowel sounds, no bruits noted. No distention or masses noted.  Musculoskeletal: Normal flexion, extension of rotation of spine. Pain in the right lower back with ROM. Pain with palpation of the lumbar spine and right lower back. Strength 5/5 BLE. No difficulty with gait.  Neurological: Alert and oriented. Sensation intact to BLE.   BMET    Component Value Date/Time   NA 138 10/04/2014 0922   K 3.9 10/04/2014 0922   CL 102 10/04/2014 0922   CO2 32 10/04/2014 0922   GLUCOSE 93 10/04/2014 0922   BUN 12 10/04/2014 0922   CREATININE 0.9 10/04/2014 0922   CALCIUM 8.9 10/04/2014 0922    Lipid Panel  No results found for: CHOL, TRIG, HDL, CHOLHDL, VLDL, LDLCALC  CBC    Component Value Date/Time   WBC 7.3 10/04/2014 0922   RBC 4.63 10/04/2014 0922   HGB 13.7 10/04/2014 0922   HCT 41.8 10/04/2014 0922   PLT 164.0 10/04/2014 0922    MCV 90.3 10/04/2014 0922   MCHC 32.7 10/04/2014 0922   RDW 14.5 10/04/2014 0922    Hgb A1C No results found for: HGBA1C       Assessment & Plan:   Urinary urgency, frequency and nocturia:  I thinks this is likely related to OAB versus Cystitis Urinalysis: normal Urine culture from last week reviewed- no growth Advised her to cut back on the caffeine and try to not drink fluids after 7 pm Offered to check for diabetes, she does want this but wants to wait until her   IT band tendonitis:  Could have been  caused by Cipro- she has already stopped this eRx for Pred Taper x 6 days   Low back pain with muscle strain of right lower back:  The prednisone given for the tendonitis should help eRx for Flexeril 5 mg to take at night if pain is severe Stretching exercises given If no improvement, will consider xray of lumbar spine  Loose stool:  Advised her to try a Probiotic OTC to see if this helps Will reevaluate at your physical exam  RTC in 1 month for your physical exam, sooner if needed

## 2015-03-14 NOTE — Addendum Note (Signed)
Addended by: Lurlean Nanny on: 03/14/2015 04:42 PM   Modules accepted: Orders

## 2015-03-14 NOTE — Patient Instructions (Signed)
Back Exercises These exercises may help you when beginning to rehabilitate your injury. Your symptoms may resolve with or without further involvement from your physician, physical therapist or athletic trainer. While completing these exercises, remember:   Restoring tissue flexibility helps normal motion to return to the joints. This allows healthier, less painful movement and activity.  An effective stretch should be held for at least 30 seconds.  A stretch should never be painful. You should only feel a gentle lengthening or release in the stretched tissue. STRETCH - Extension, Prone on Elbows   Lie on your stomach on the floor, a bed will be too soft. Place your palms about shoulder width apart and at the height of your head.  Place your elbows under your shoulders. If this is too painful, stack pillows under your chest.  Allow your body to relax so that your hips drop lower and make contact more completely with the floor.  Hold this position for __________ seconds.  Slowly return to lying flat on the floor. Repeat __________ times. Complete this exercise __________ times per day.  RANGE OF MOTION - Extension, Prone Press Ups   Lie on your stomach on the floor, a bed will be too soft. Place your palms about shoulder width apart and at the height of your head.  Keeping your back as relaxed as possible, slowly straighten your elbows while keeping your hips on the floor. You may adjust the placement of your hands to maximize your comfort. As you gain motion, your hands will come more underneath your shoulders.  Hold this position __________ seconds.  Slowly return to lying flat on the floor. Repeat __________ times. Complete this exercise __________ times per day.  RANGE OF MOTION- Quadruped, Neutral Spine   Assume a hands and knees position on a firm surface. Keep your hands under your shoulders and your knees under your hips. You may place padding under your knees for  comfort.  Drop your head and point your tail bone toward the ground below you. This will round out your low back like an angry cat. Hold this position for __________ seconds.  Slowly lift your head and release your tail bone so that your back sags into a large arch, like an old horse.  Hold this position for __________ seconds.  Repeat this until you feel limber in your low back.  Now, find your "sweet spot." This will be the most comfortable position somewhere between the two previous positions. This is your neutral spine. Once you have found this position, tense your stomach muscles to support your low back.  Hold this position for __________ seconds. Repeat __________ times. Complete this exercise __________ times per day.  STRETCH - Flexion, Single Knee to Chest   Lie on a firm bed or floor with both legs extended in front of you.  Keeping one leg in contact with the floor, bring your opposite knee to your chest. Hold your leg in place by either grabbing behind your thigh or at your knee.  Pull until you feel a gentle stretch in your low back. Hold __________ seconds.  Slowly release your grasp and repeat the exercise with the opposite side. Repeat __________ times. Complete this exercise __________ times per day.  STRETCH - Hamstrings, Standing  Stand or sit and extend your right / left leg, placing your foot on a chair or foot stool  Keeping a slight arch in your low back and your hips straight forward.  Lead with your chest and   lean forward at the waist until you feel a gentle stretch in the back of your right / left knee or thigh. (When done correctly, this exercise requires leaning only a small distance.)  Hold this position for __________ seconds. Repeat __________ times. Complete this stretch __________ times per day. STRENGTHENING - Deep Abdominals, Pelvic Tilt   Lie on a firm bed or floor. Keeping your legs in front of you, bend your knees so they are both pointed  toward the ceiling and your feet are flat on the floor.  Tense your lower abdominal muscles to press your low back into the floor. This motion will rotate your pelvis so that your tail bone is scooping upwards rather than pointing at your feet or into the floor.  With a gentle tension and even breathing, hold this position for __________ seconds. Repeat __________ times. Complete this exercise __________ times per day.  STRENGTHENING - Abdominals, Crunches   Lie on a firm bed or floor. Keeping your legs in front of you, bend your knees so they are both pointed toward the ceiling and your feet are flat on the floor. Cross your arms over your chest.  Slightly tip your chin down without bending your neck.  Tense your abdominals and slowly lift your trunk high enough to just clear your shoulder blades. Lifting higher can put excessive stress on the low back and does not further strengthen your abdominal muscles.  Control your return to the starting position. Repeat __________ times. Complete this exercise __________ times per day.  STRENGTHENING - Quadruped, Opposite UE/LE Lift   Assume a hands and knees position on a firm surface. Keep your hands under your shoulders and your knees under your hips. You may place padding under your knees for comfort.  Find your neutral spine and gently tense your abdominal muscles so that you can maintain this position. Your shoulders and hips should form a rectangle that is parallel with the floor and is not twisted.  Keeping your trunk steady, lift your right hand no higher than your shoulder and then your left leg no higher than your hip. Make sure you are not holding your breath. Hold this position __________ seconds.  Continuing to keep your abdominal muscles tense and your back steady, slowly return to your starting position. Repeat with the opposite arm and leg. Repeat __________ times. Complete this exercise __________ times per day. Document Released:  12/14/2005 Document Revised: 02/18/2012 Document Reviewed: 03/10/2009 Exit[employer] Patient Information 2015 [employer]. This information is not intended to replace advice given to you by your health care provider. Make sure you discuss any questions you have with your health care provider.  

## 2015-03-15 ENCOUNTER — Other Ambulatory Visit: Payer: Self-pay

## 2015-03-15 MED ORDER — OMEPRAZOLE 20 MG PO CPDR: 20.0000 mg | DELAYED_RELEASE_CAPSULE | Freq: Every day | ORAL | Status: DC

## 2015-03-24 ENCOUNTER — Other Ambulatory Visit: Payer: Self-pay | Admitting: Internal Medicine

## 2015-03-24 DIAGNOSIS — Z0000 Encounter for general adult medical examination without abnormal findings: Secondary | ICD-10-CM

## 2015-03-27 ENCOUNTER — Other Ambulatory Visit: Payer: Self-pay | Admitting: Internal Medicine

## 2015-03-29 ENCOUNTER — Other Ambulatory Visit: Payer: Federal, State, Local not specified - PPO

## 2015-04-05 ENCOUNTER — Ambulatory Visit (INDEPENDENT_AMBULATORY_CARE_PROVIDER_SITE_OTHER): Payer: Federal, State, Local not specified - PPO | Admitting: Internal Medicine

## 2015-04-05 ENCOUNTER — Encounter: Payer: Self-pay | Admitting: Internal Medicine

## 2015-04-05 VITALS — BP 136/84 | HR 84 | Temp 98.2°F | Ht 64.0 in | Wt 222.0 lb

## 2015-04-05 DIAGNOSIS — H6981 Other specified disorders of Eustachian tube, right ear: Secondary | ICD-10-CM | POA: Diagnosis not present

## 2015-04-05 DIAGNOSIS — Z0000 Encounter for general adult medical examination without abnormal findings: Secondary | ICD-10-CM

## 2015-04-05 DIAGNOSIS — G4700 Insomnia, unspecified: Secondary | ICD-10-CM

## 2015-04-05 DIAGNOSIS — F411 Generalized anxiety disorder: Secondary | ICD-10-CM | POA: Diagnosis not present

## 2015-04-05 LAB — LIPID PANEL
Cholesterol: 188 mg/dL (ref 0–200)
HDL: 48.2 mg/dL (ref >39.00)
LDL Cholesterol: 113 mg/dL — ABNORMAL HIGH (ref 0–99)
NonHDL: 139.8
Total CHOL/HDL Ratio: 4
Triglycerides: 136 mg/dL (ref 0.0–149.0)
VLDL: 27.2 mg/dL (ref 0.0–40.0)

## 2015-04-05 LAB — COMPREHENSIVE METABOLIC PANEL
ALT: 10 U/L (ref 0–35)
AST: 12 U/L (ref 0–37)
Albumin: 4 g/dL (ref 3.5–5.2)
Alkaline Phosphatase: 53 U/L (ref 39–117)
BUN: 16 mg/dL (ref 6–23)
CO2: 31 mEq/L (ref 19–32)
Calcium: 9 mg/dL (ref 8.4–10.5)
Chloride: 103 mEq/L (ref 96–112)
Creatinine, Ser: 0.7 mg/dL (ref 0.40–1.20)
GFR: 96.98 mL/min (ref >60.00)
Glucose, Bld: 103 mg/dL — ABNORMAL HIGH (ref 70–99)
Potassium: 4.2 mEq/L (ref 3.5–5.1)
Sodium: 138 mEq/L (ref 135–145)
Total Bilirubin: 0.3 mg/dL (ref 0.2–1.2)
Total Protein: 6.6 g/dL (ref 6.0–8.3)

## 2015-04-05 LAB — CBC
HCT: 40.7 % (ref 36.0–46.0)
Hemoglobin: 13.6 g/dL (ref 12.0–15.0)
MCHC: 33.5 g/dL (ref 30.0–36.0)
MCV: 89.2 fl (ref 78.0–100.0)
Platelets: 137 10*3/uL — ABNORMAL LOW (ref 150.0–400.0)
RBC: 4.56 Mil/uL (ref 3.87–5.11)
RDW: 14.5 % (ref 11.5–15.5)
WBC: 7.9 10*3/uL (ref 4.0–10.5)

## 2015-04-05 LAB — T4, FREE: Free T4: 0.9 ng/dL (ref 0.60–1.60)

## 2015-04-05 LAB — HEMOGLOBIN A1C: Hgb A1c MFr Bld: 5.9 % (ref 4.6–6.5)

## 2015-04-05 LAB — TSH: TSH: 1.04 u[IU]/mL (ref 0.35–4.50)

## 2015-04-05 MED ORDER — FLUOXETINE HCL 20 MG PO TABS: 20.0000 mg | ORAL_TABLET | Freq: Every day | ORAL | Status: DC

## 2015-04-05 MED ORDER — TRAZODONE HCL 50 MG PO TABS: 25.0000 mg | ORAL_TABLET | Freq: Every evening | ORAL | Status: DC | PRN

## 2015-04-05 NOTE — Assessment & Plan Note (Signed)
Good control on Trazadone Will check CMET today Trazadone refilled today

## 2015-04-05 NOTE — Addendum Note (Signed)
Addended by: Ellamae Sia on: 04/05/2015 08:47 AM   Modules accepted: Orders

## 2015-04-05 NOTE — Progress Notes (Signed)
Subjective:    Patient ID: Anne Mcguire, female    DOB: Aug 09, 1971, 44 y.o.   MRN: [id number]  HPI  Pt presents to the clinic today for her annual exam.  Flu: 09/2014 Tetanus: 2013 LMP: 03/16/15 Pap Smear: 09/2014 Mammogram: 02/2014 Vision Screening: 08/2014 at [facility] Dentist: biannually  She also c/o pain in her right ear. This started 1 week ago. She reports the pain is sharp and stabbing at time. She denies fever, chills or body aches.  She denies hearing loss. It has kept her from sleeping a few nights this week. She has tried Ibuprofen and has put Peroxide in it with some relief.  She also is in need of her Prozac and Trazadone. The Prozac is very effective for her anxiety disorder. She takes it daily without side effect. She has had more trouble sleeping over the last week, despite taking her Trazadone. She thinks it may be related to her ear pain, because the Trazadone typically works very well for her.  Review of Systems      Past Medical History  Diagnosis Date  . Chicken pox   . Depression   . Migraines   . Thyroid disease   . Anxiety   . GERD (gastroesophageal reflux disease)   . Hypertension   . Hyperlipidemia   . History of stomach ulcers     Current Outpatient Prescriptions  Medication Sig Dispense Refill  . B Complex-C (SUPER B COMPLEX/VITAMIN C PO) Take 1 capsule by mouth daily.    . ciprofloxacin (CIPRO) 500 MG tablet Take 1 tablet (500 mg total) by mouth 2 (two) times daily. 14 tablet 0  . cyclobenzaprine (FLEXERIL) 5 MG tablet Take 1 tablet (5 mg total) by mouth at bedtime. 20 tablet 0  . ferrous gluconate (FERGON) 240 (27 FE) MG tablet Take 240 mg by mouth daily.    Marland Kitchen FLUoxetine (PROZAC) 20 MG tablet TAKE 1 TABLET BY MOUTH EVERY DAY 30 tablet 1  . fluticasone (FLONASE) 50 MCG/ACT nasal spray Place 2 sprays into both nostrils daily.    Marland Kitchen levothyroxine (SYNTHROID, LEVOTHROID) 100 MCG tablet TAKE 1 TABLET (100 MCG TOTAL) BY MOUTH DAILY BEFORE  BREAKFAST. 30 tablet 1  . naproxen sodium (ANAPROX) 550 MG tablet Take 1 tablet (550 mg total) by mouth 2 (two) times daily with a meal. 60 tablet 1  . norethindrone (MICRONOR,CAMILA,ERRIN) 0.35 MG tablet Take 1 tablet by mouth daily.    Marland Kitchen omeprazole (PRILOSEC) 20 MG capsule Take 1 capsule (20 mg total) by mouth daily. 30 capsule 5  . predniSONE (DELTASONE) 10 MG tablet Take 6 tabs day 1, 5 tabs day 2, 4 tabs day 3, 3 tabs day 4, 2 tabs day 5, 1 tab day 6 21 tablet 0  . traZODone (DESYREL) 50 MG tablet TAKE 1/2 TO 1 TABLET BY MOUTH AT BEDTIME AS NEEDED FOR SLEEP 30 tablet 1   No current facility-administered medications for this visit.    Allergies  Allergen Reactions  . Azithromycin Hives    Family History  Problem Relation Age of Onset  . Mental illness Mother   . Arthritis Father   . Hyperlipidemia Father   . Hypertension Father   . Diabetes Father   . Arthritis Maternal Grandmother   . Diabetes Maternal Grandmother   . Arthritis Maternal Grandfather   . Diabetes Maternal Grandfather   . Arthritis Paternal Grandmother   . Breast cancer Paternal Grandmother     History   Social History  .  Marital Status: Married    Spouse Name: N/A  . Number of Children: N/A  . Years of Education: N/A   Occupational History  . Not on file.   Social History Main Topics  . Smoking status: Never Smoker   . Smokeless tobacco: Not on file  . Alcohol Use: Yes     Comment: occasional  . Drug Use: No  . Sexual Activity: Yes   Other Topics Concern  . Not on file   Social History Narrative     Constitutional: Denies fever, malaise, fatigue, headache or abrupt weight changes.  HEENT: Pt reports right ear pain. Denies eye pain, eye redness, ringing in the ears, wax buildup, runny nose, nasal congestion, bloody nose, or sore throat. Respiratory: Denies difficulty breathing, shortness of breath, cough or sputum production.   Cardiovascular: Denies chest pain, chest tightness,  palpitations or swelling in the hands or feet.  Gastrointestinal: Pt reports constipation. Denies abdominal pain, bloating, diarrhea or blood in the stool.  GU: Denies urgency, frequency, pain with urination, burning sensation, blood in urine, odor or discharge. Musculoskeletal: Denies decrease in range of motion, difficulty with gait, muscle pain or joint pain and swelling.  Skin: Denies redness, rashes, lesions or ulcercations.  Neurological: Denies dizziness, difficulty with memory, difficulty with speech or problems with balance and coordination.  Psych: Pt has history of anxiety. Denies depression, SI/HI.  No other specific complaints in a complete review of systems (except as listed in HPI above).  Objective:   Physical Exam   BP 136/84 mmHg  Pulse 84  Temp(Src) 98.2 F (36.8 C) (Oral)  Ht 5\' 4"  (1.626 m)  Wt 222 lb (100.699 kg)  BMI 38.09 kg/m2  SpO2 98%  LMP 03/16/2015 Wt Readings from Last 3 Encounters:  04/05/15 222 lb (100.699 kg)  03/14/15 222 lb (100.699 kg)  03/07/15 222 lb 12.8 oz (101.061 kg)    General: Appears her stated age, obese in NAD. Skin: Warm, dry and intact. No rashes, lesions or ulcerations noted. HEENT: Head: normal shape and size; Eyes: sclera white, no icterus, conjunctiva pink, PERRLA and EOMs intact; Ears: Tm's gray and intact, normal light reflex, + effusion on the right; Nose: mucosa pink and moist, septum midline; Throat/Mouth: Teeth present, mucosa pink and moist, no exudate, lesions or ulcerations noted.  Neck: Neck supple, trachea midline. No masses, lumps or thyromegaly present.  Cardiovascular: Normal rate and rhythm. S1,S2 noted.  No murmur, rubs or gallops noted. No JVD or BLE edema. No carotid bruits noted. Pulmonary/Chest: Normal effort and positive vesicular breath sounds. No respiratory distress. No wheezes, rales or ronchi noted.  Abdomen: Soft and nontender. Normal bowel sounds, no bruits noted. No distention or masses noted. Liver,  spleen and kidneys non palpable. Musculoskeletal: Strength 5/5 BUE/BLE. No signs of joint swelling. No difficulty with gait.  Neurological: Alert and oriented. Cranial nerves II-XII grossly intact. Coordination normal.  Psychiatric: Mood slightly anxious but affect normal. Behavior is normal. Judgment and thought content normal.     BMET    Component Value Date/Time   NA 138 10/04/2014 0922   K 3.9 10/04/2014 0922   CL 102 10/04/2014 0922   CO2 32 10/04/2014 0922   GLUCOSE 93 10/04/2014 0922   BUN 12 10/04/2014 0922   CREATININE 0.9 10/04/2014 0922   CALCIUM 8.9 10/04/2014 0922    Lipid Panel  No results found for: CHOL, TRIG, HDL, CHOLHDL, VLDL, LDLCALC  CBC    Component Value Date/Time   WBC 7.3 10/04/2014  0922   RBC 4.63 10/04/2014 0922   HGB 13.7 10/04/2014 0922   HCT 41.8 10/04/2014 0922   PLT 164.0 10/04/2014 0922   MCV 90.3 10/04/2014 0922   MCHC 32.7 10/04/2014 0922   RDW 14.5 10/04/2014 0922    Hgb A1C No results found for: HGBA1C      Assessment & Plan:   Preventative Health Maintenance:  Will check CBC, CMET, Lipid, A1C, TSH and Free T4 Encouraged her to work on diet and exercise All HM UTD  ETD, right:  Advised her to try Flonase in right nare daily x 1-2 weeks If no improvement, she should let me know Ok to continue Ibuprofen to help with pain  RTC in 6 months to follow up chronic condtions

## 2015-04-05 NOTE — Patient Instructions (Signed)

## 2015-04-05 NOTE — Progress Notes (Signed)
Pre visit review using our clinic review tool, if applicable. No additional management support is needed unless otherwise documented below in the visit note. 

## 2015-04-05 NOTE — Assessment & Plan Note (Signed)
Well controlled on Prozac Will check CMET today Prozac refilled today

## 2015-05-24 ENCOUNTER — Other Ambulatory Visit: Payer: Self-pay | Admitting: Internal Medicine

## 2015-09-27 ENCOUNTER — Other Ambulatory Visit: Payer: Self-pay | Admitting: Internal Medicine

## 2015-10-05 ENCOUNTER — Ambulatory Visit: Payer: Federal, State, Local not specified - PPO | Admitting: Internal Medicine

## 2015-10-06 ENCOUNTER — Encounter: Payer: Self-pay | Admitting: Internal Medicine

## 2015-10-06 ENCOUNTER — Ambulatory Visit (INDEPENDENT_AMBULATORY_CARE_PROVIDER_SITE_OTHER): Payer: Federal, State, Local not specified - PPO | Admitting: Internal Medicine

## 2015-10-06 VITALS — BP 132/92 | HR 84 | Temp 98.4°F | Wt 223.0 lb

## 2015-10-06 DIAGNOSIS — F411 Generalized anxiety disorder: Secondary | ICD-10-CM

## 2015-10-06 DIAGNOSIS — K219 Gastro-esophageal reflux disease without esophagitis: Secondary | ICD-10-CM

## 2015-10-06 DIAGNOSIS — G4700 Insomnia, unspecified: Secondary | ICD-10-CM

## 2015-10-06 DIAGNOSIS — E039 Hypothyroidism, unspecified: Secondary | ICD-10-CM | POA: Diagnosis not present

## 2015-10-06 LAB — COMPREHENSIVE METABOLIC PANEL
ALT: 10 U/L (ref 0–35)
AST: 13 U/L (ref 0–37)
Albumin: 4 g/dL (ref 3.5–5.2)
Alkaline Phosphatase: 58 U/L (ref 39–117)
BUN: 13 mg/dL (ref 6–23)
CO2: 29 mEq/L (ref 19–32)
Calcium: 9.3 mg/dL (ref 8.4–10.5)
Chloride: 102 mEq/L (ref 96–112)
Creatinine, Ser: 0.77 mg/dL (ref 0.40–1.20)
GFR: 86.68 mL/min (ref >60.00)
Glucose, Bld: 109 mg/dL — ABNORMAL HIGH (ref 70–99)
Potassium: 4.1 mEq/L (ref 3.5–5.1)
Sodium: 139 mEq/L (ref 135–145)
Total Bilirubin: 0.4 mg/dL (ref 0.2–1.2)
Total Protein: 7 g/dL (ref 6.0–8.3)

## 2015-10-06 LAB — TSH: TSH: 0.44 u[IU]/mL (ref 0.35–4.50)

## 2015-10-06 LAB — CBC
HCT: 41.4 % (ref 36.0–46.0)
Hemoglobin: 13.7 g/dL (ref 12.0–15.0)
MCHC: 33.1 g/dL (ref 30.0–36.0)
MCV: 88 fl (ref 78.0–100.0)
Platelets: 162 10*3/uL (ref 150.0–400.0)
RBC: 4.7 Mil/uL (ref 3.87–5.11)
RDW: 13.9 % (ref 11.5–15.5)
WBC: 9 10*3/uL (ref 4.0–10.5)

## 2015-10-06 LAB — T4, FREE: Free T4: 0.96 ng/dL (ref 0.60–1.60)

## 2015-10-06 MED ORDER — FLUOXETINE HCL 40 MG PO CAPS: 40.0000 mg | ORAL_CAPSULE | Freq: Every day | ORAL | Status: DC

## 2015-10-06 MED ORDER — TRAZODONE HCL 50 MG PO TABS: 25.0000 mg | ORAL_TABLET | Freq: Every evening | ORAL | Status: DC | PRN

## 2015-10-06 MED ORDER — OMEPRAZOLE 20 MG PO CPDR: 20.0000 mg | DELAYED_RELEASE_CAPSULE | Freq: Every day | ORAL | Status: DC

## 2015-10-06 NOTE — Progress Notes (Signed)
Pre visit review using our clinic review tool, if applicable. No additional management support is needed unless otherwise documented below in the visit note. 

## 2015-10-06 NOTE — Assessment & Plan Note (Signed)
Well controlled on Prilosec Will check CBC and CMET today Discussed how weight loss could help improve her symptoms

## 2015-10-06 NOTE — Assessment & Plan Note (Signed)
Will check TSH and T4 today Will adjust Synthroid dose if needed based on labs

## 2015-10-06 NOTE — Assessment & Plan Note (Addendum)
Slightly worse Will increase dose of Prozac to 40 mg daily She declines RX for Xanax 0.25 mg prn because she reports it makes her feel irritable the next day Support offered today CMET today

## 2015-10-06 NOTE — Progress Notes (Signed)
Subjective:    Patient ID: Anne Mcguire, female    DOB: [birth year], 44 y.o.   MRN: [id number]  HPI  Pt presents to the clinic today for 6 month follow up of chronic conditions.  GAD: Her anxiety has gotten slightly worse over the last few months. She has times where she gets extremely anxious, almost panicky but she denies panic attacks. She does feel like the Prozac works well. She also feels like she may need something to take as needed for those time when she is especially anxious. She denies depression, SI/HI.  GERD: Her reflux is triggered by eating spicy foods or tomato based products. She does feel like her symptoms are well controlled on Prilosec.  Hypothyroidism: She denies any issues on her current dose of Synthroid. She has felt more fatigue, noticed some weight gain and hair loss. She denies cold intolerance, constipation or depression. She is due for repeat labs today.  Insomnia: She is sleeping well with Trazadone. She has not had any adverse effects from the medication.  Review of Systems      Past Medical History  Diagnosis Date  . Chicken pox   . Depression   . Migraines   . Thyroid disease   . Anxiety   . GERD (gastroesophageal reflux disease)   . Hypertension   . Hyperlipidemia   . History of stomach ulcers     Current Outpatient Prescriptions  Medication Sig Dispense Refill  . B Complex-C (SUPER B COMPLEX/VITAMIN C PO) Take 1 capsule by mouth daily.    . cyclobenzaprine (FLEXERIL) 5 MG tablet Take 1 tablet (5 mg total) by mouth at bedtime. 20 tablet 0  . ferrous gluconate (FERGON) 240 (27 FE) MG tablet Take 240 mg by mouth daily.    Marland Kitchen FLUoxetine (PROZAC) 20 MG tablet Take 1 tablet (20 mg total) by mouth daily. 30 tablet 5  . fluticasone (FLONASE) 50 MCG/ACT nasal spray Place 2 sprays into both nostrils daily.    Marland Kitchen levothyroxine (SYNTHROID, LEVOTHROID) 100 MCG tablet TAKE 1 TABLET BY MOUTH DAILY BEFORE BREAKFAST. 30 tablet 1  . naproxen sodium  (ANAPROX) 550 MG tablet Take 1 tablet (550 mg total) by mouth 2 (two) times daily with a meal. 60 tablet 1  . norethindrone (MICRONOR,CAMILA,ERRIN) 0.35 MG tablet Take 1 tablet by mouth daily.    Marland Kitchen omeprazole (PRILOSEC) 20 MG capsule Take 1 capsule (20 mg total) by mouth daily. 30 capsule 5  . traZODone (DESYREL) 50 MG tablet Take 0.5-1 tablets (25-50 mg total) by mouth at bedtime as needed. for sleep 30 tablet 5   No current facility-administered medications for this visit.    Allergies  Allergen Reactions  . Azithromycin Hives    Family History  Problem Relation Age of Onset  . Mental illness Mother   . Arthritis Father   . Hyperlipidemia Father   . Hypertension Father   . Diabetes Father   . Arthritis Maternal Grandmother   . Diabetes Maternal Grandmother   . Arthritis Maternal Grandfather   . Diabetes Maternal Grandfather   . Arthritis Paternal Grandmother   . Breast cancer Paternal Grandmother     Social History   Social History  . Marital Status: Married    Spouse Name: N/A  . Number of Children: N/A  . Years of Education: N/A   Occupational History  . Not on file.   Social History Main Topics  . Smoking status: Never Smoker   . Smokeless tobacco: Not on file  .  Alcohol Use: Yes     Comment: occasional  . Drug Use: No  . Sexual Activity: Yes   Other Topics Concern  . Not on file   Social History Narrative     Constitutional: Pt reports fatigue and weight gain. Denies fever, malaise, headache.  Respiratory: Denies difficulty breathing, shortness of breath, cough or sputum production.   Cardiovascular: Denies chest pain, chest tightness, palpitations or swelling in the hands or feet.  Gastrointestinal: Denies abdominal pain, bloating, constipation, diarrhea or blood in the stool.  Neurological: Denies dizziness, difficulty with memory, difficulty with speech or problems with balance and coordination.  Psych: Pt reports anxiety. Denies  depression,  SI/HI.  No other specific complaints in a complete review of systems (except as listed in HPI above).  Objective:   Physical Exam   BP 132/92 mmHg  Pulse 84  Temp(Src) 98.4 F (36.9 C) (Oral)  Wt 223 lb (101.152 kg)  SpO2 98%  LMP 09/20/2015 Wt Readings from Last 3 Encounters:  10/06/15 223 lb (101.152 kg)  04/05/15 222 lb (100.699 kg)  03/14/15 222 lb (100.699 kg)    General: Appears her stated age, obese in NAD. Neck:  Neck supple, trachea midline. No masses, lumps or thyromegaly present.  Cardiovascular: Normal rate and rhythm. S1,S2 noted.  No murmur, rubs or gallops noted.  Pulmonary/Chest: Normal effort and positive vesicular breath sounds. No respiratory distress. No wheezes, rales or ronchi noted.  Abdomen: Soft and nontender. Normal bowel sounds. Neurological: Alert and oriented.  Psychiatric: Mood and affect slightly flat. She does not appear anxious. She does engage.  BMET    Component Value Date/Time   NA 138 04/05/2015 0845   K 4.2 04/05/2015 0845   CL 103 04/05/2015 0845   CO2 31 04/05/2015 0845   GLUCOSE 103* 04/05/2015 0845   BUN 16 04/05/2015 0845   CREATININE 0.70 04/05/2015 0845   CALCIUM 9.0 04/05/2015 0845    Lipid Panel     Component Value Date/Time   CHOL 188 04/05/2015 0845   TRIG 136.0 04/05/2015 0845   HDL 48.20 04/05/2015 0845   CHOLHDL 4 04/05/2015 0845   VLDL 27.2 04/05/2015 0845   LDLCALC 113* 04/05/2015 0845    CBC    Component Value Date/Time   WBC 7.9 04/05/2015 0845   RBC 4.56 04/05/2015 0845   HGB 13.6 04/05/2015 0845   HCT 40.7 04/05/2015 0845   PLT 137.0* 04/05/2015 0845   MCV 89.2 04/05/2015 0845   MCHC 33.5 04/05/2015 0845   RDW 14.5 04/05/2015 0845    Hgb A1C Lab Results  Component Value Date   HGBA1C 5.9 04/05/2015        Assessment & Plan:

## 2015-10-06 NOTE — Assessment & Plan Note (Signed)
No issues on Trazadone Will check CMET today

## 2015-10-06 NOTE — Patient Instructions (Signed)
Insomnia Insomnia is a sleep disorder that makes it difficult to fall asleep or to stay asleep. Insomnia can cause tiredness (fatigue), low energy, difficulty concentrating, mood swings, and poor performance at work or school.  There are three different ways to classify insomnia:  Difficulty falling asleep.  Difficulty staying asleep.  Waking up too early in the morning. Any type of insomnia can be long-term (chronic) or short-term (acute). Both are common. Short-term insomnia usually lasts for three months or less. Chronic insomnia occurs at least three times a week for longer than three months. CAUSES  Insomnia may be caused by another condition, situation, or substance, such as:  Anxiety.  Certain medicines.  Gastroesophageal reflux disease (GERD) or other gastrointestinal conditions.  Asthma or other breathing conditions.  Restless legs syndrome, sleep apnea, or other sleep disorders.  Chronic pain.  Menopause. This may include hot flashes.  Stroke.  Abuse of alcohol, tobacco, or illegal drugs.  Depression.  Caffeine.   Neurological disorders, such as Alzheimer disease.  An overactive thyroid (hyperthyroidism). The cause of insomnia may not be known. RISK FACTORS Risk factors for insomnia include:  Gender. Women are more commonly affected than men.  Age. Insomnia is more common as you get older.  Stress. This may involve your professional or personal life.  Income. Insomnia is more common in people with lower income.  Lack of exercise.   Irregular work schedule or night shifts.  Traveling between different time zones. SIGNS AND SYMPTOMS If you have insomnia, trouble falling asleep or trouble staying asleep is the main symptom. This may lead to other symptoms, such as:  Feeling fatigued.  Feeling nervous about going to sleep.  Not feeling rested in the morning.  Having trouble concentrating.  Feeling irritable, anxious, or depressed. TREATMENT   Treatment for insomnia depends on the cause. If your insomnia is caused by an underlying condition, treatment will focus on addressing the condition. Treatment may also include:   Medicines to help you sleep.  Counseling or therapy.  Lifestyle adjustments. HOME CARE INSTRUCTIONS   Take medicines only as directed by your health care provider.  Keep regular sleeping and waking hours. Avoid naps.  Keep a sleep diary to help you and your health care provider figure out what could be causing your insomnia. Include:   When you sleep.  When you wake up during the night.  How well you sleep.   How rested you feel the next day.  Any side effects of medicines you are taking.  What you eat and drink.   Make your bedroom a comfortable place where it is easy to fall asleep:  Put up shades or special blackout curtains to block light from outside.  Use a white noise machine to block noise.  Keep the temperature cool.   Exercise regularly as directed by your health care provider. Avoid exercising right before bedtime.  Use relaxation techniques to manage stress. Ask your health care provider to suggest some techniques that may work well for you. These may include:  Breathing exercises.  Routines to release muscle tension.  Visualizing peaceful scenes.  Cut back on alcohol, caffeinated beverages, and cigarettes, especially close to bedtime. These can disrupt your sleep.  Do not overeat or eat spicy foods right before bedtime. This can lead to digestive discomfort that can make it hard for you to sleep.  Limit screen use before bedtime. This includes:  Watching TV.  Using your smartphone, tablet, and computer.  Stick to a routine. This   can help you fall asleep faster. Try to do a quiet activity, brush your teeth, and go to bed at the same time each night.  Get out of bed if you are still awake after 15 minutes of trying to sleep. Keep the lights down, but try reading or  doing a quiet activity. When you feel sleepy, go back to bed.  Make sure that you drive carefully. Avoid driving if you feel very sleepy.  Keep all follow-up appointments as directed by your health care provider. This is important. SEEK MEDICAL CARE IF:   You are tired throughout the day or have trouble in your daily routine due to sleepiness.  You continue to have sleep problems or your sleep problems get worse. SEEK IMMEDIATE MEDICAL CARE IF:   You have serious thoughts about hurting yourself or someone else.   This information is not intended to replace advice given to you by your health care provider. Make sure you discuss any questions you have with your health care provider.   Document Released: 11/23/2000 Document Revised: 08/17/2015 Document Reviewed: 08/27/2014 Elsevier Interactive Patient Education 2016 [employer]  

## 2015-11-11 ENCOUNTER — Other Ambulatory Visit: Payer: Self-pay | Admitting: Internal Medicine

## 2016-01-31 LAB — HM PAP SMEAR: HM Pap smear: NORMAL

## 2016-01-31 LAB — HM MAMMOGRAPHY

## 2016-03-11 ENCOUNTER — Other Ambulatory Visit: Payer: Self-pay | Admitting: Internal Medicine

## 2016-04-01 ENCOUNTER — Other Ambulatory Visit: Payer: Self-pay | Admitting: Internal Medicine

## 2016-04-05 ENCOUNTER — Ambulatory Visit (INDEPENDENT_AMBULATORY_CARE_PROVIDER_SITE_OTHER): Payer: Federal, State, Local not specified - PPO | Admitting: Internal Medicine

## 2016-04-05 ENCOUNTER — Encounter: Payer: Self-pay | Admitting: Internal Medicine

## 2016-04-05 VITALS — BP 130/82 | HR 83 | Temp 98.3°F | Ht 64.0 in | Wt 228.0 lb

## 2016-04-05 DIAGNOSIS — E039 Hypothyroidism, unspecified: Secondary | ICD-10-CM | POA: Diagnosis not present

## 2016-04-05 DIAGNOSIS — K219 Gastro-esophageal reflux disease without esophagitis: Secondary | ICD-10-CM | POA: Diagnosis not present

## 2016-04-05 DIAGNOSIS — F411 Generalized anxiety disorder: Secondary | ICD-10-CM

## 2016-04-05 DIAGNOSIS — G4700 Insomnia, unspecified: Secondary | ICD-10-CM

## 2016-04-05 DIAGNOSIS — Z0000 Encounter for general adult medical examination without abnormal findings: Secondary | ICD-10-CM | POA: Diagnosis not present

## 2016-04-05 DIAGNOSIS — R6889 Other general symptoms and signs: Secondary | ICD-10-CM | POA: Diagnosis not present

## 2016-04-05 DIAGNOSIS — Z0001 Encounter for general adult medical examination with abnormal findings: Secondary | ICD-10-CM | POA: Diagnosis not present

## 2016-04-05 DIAGNOSIS — M79601 Pain in right arm: Secondary | ICD-10-CM

## 2016-04-05 LAB — COMPREHENSIVE METABOLIC PANEL
ALT: 24 U/L (ref 0–35)
AST: 25 U/L (ref 0–37)
Albumin: 3.9 g/dL (ref 3.5–5.2)
Alkaline Phosphatase: 50 U/L (ref 39–117)
BUN: 12 mg/dL (ref 6–23)
CO2: 32 mEq/L (ref 19–32)
Calcium: 8.9 mg/dL (ref 8.4–10.5)
Chloride: 102 mEq/L (ref 96–112)
Creatinine, Ser: 0.67 mg/dL (ref 0.40–1.20)
GFR: 101.54 mL/min (ref >60.00)
Glucose, Bld: 103 mg/dL — ABNORMAL HIGH (ref 70–99)
Potassium: 3.7 mEq/L (ref 3.5–5.1)
Sodium: 140 mEq/L (ref 135–145)
Total Bilirubin: 0.4 mg/dL (ref 0.2–1.2)
Total Protein: 6.7 g/dL (ref 6.0–8.3)

## 2016-04-05 LAB — HEMOGLOBIN A1C: Hgb A1c MFr Bld: 6.2 % (ref 4.6–6.5)

## 2016-04-05 LAB — LIPID PANEL
Cholesterol: 184 mg/dL (ref 0–200)
HDL: 45.7 mg/dL (ref >39.00)
LDL Cholesterol: 114 mg/dL — ABNORMAL HIGH (ref 0–99)
NonHDL: 137.82
Total CHOL/HDL Ratio: 4
Triglycerides: 121 mg/dL (ref 0.0–149.0)
VLDL: 24.2 mg/dL (ref 0.0–40.0)

## 2016-04-05 LAB — CBC
HCT: 38.3 % (ref 36.0–46.0)
Hemoglobin: 12.7 g/dL (ref 12.0–15.0)
MCHC: 33.1 g/dL (ref 30.0–36.0)
MCV: 86.6 fl (ref 78.0–100.0)
Platelets: 166 10*3/uL (ref 150.0–400.0)
RBC: 4.43 Mil/uL (ref 3.87–5.11)
RDW: 14.5 % (ref 11.5–15.5)
WBC: 7.2 10*3/uL (ref 4.0–10.5)

## 2016-04-05 LAB — T4, FREE: Free T4: 1 ng/dL (ref 0.60–1.60)

## 2016-04-05 LAB — TSH: TSH: 0.72 u[IU]/mL (ref 0.35–4.50)

## 2016-04-05 MED ORDER — TRAZODONE HCL 50 MG PO TABS: ORAL_TABLET | ORAL | Status: DC

## 2016-04-05 MED ORDER — FLUOXETINE HCL 40 MG PO CAPS: 40.0000 mg | ORAL_CAPSULE | Freq: Every day | ORAL | Status: DC

## 2016-04-05 MED ORDER — OMEPRAZOLE 20 MG PO CPDR: DELAYED_RELEASE_CAPSULE | ORAL | Status: DC

## 2016-04-05 NOTE — Progress Notes (Signed)
Pre visit review using our clinic review tool, if applicable. No additional management support is needed unless otherwise documented below in the visit note. 

## 2016-04-05 NOTE — Assessment & Plan Note (Signed)
Continue Trazadone at night

## 2016-04-05 NOTE — Assessment & Plan Note (Signed)
Will check TSH and T4 today Will adjust Synthroid as needed based on labs

## 2016-04-05 NOTE — Patient Instructions (Signed)
Health Maintenance, Female Adopting a healthy lifestyle and getting preventive care can go a long way to promote health and wellness. Talk with your health care provider about what schedule of regular examinations is right for you. This is a good chance for you to check in with your provider about disease prevention and staying healthy. In between checkups, there are plenty of things you can do on your own. Experts have done a lot of research about which lifestyle changes and preventive measures are most likely to keep you healthy. Ask your health care provider for more information. WEIGHT AND DIET  Eat a healthy diet  Be sure to include plenty of vegetables, fruits, low-fat dairy products, and lean protein.  Do not eat a lot of foods high in solid fats, added sugars, or salt.  Get regular exercise. This is one of the most important things you can do for your health.  Most adults should exercise for at least 150 minutes each week. The exercise should increase your heart rate and make you sweat (moderate-intensity exercise).  Most adults should also do strengthening exercises at least twice a week. This is in addition to the moderate-intensity exercise.  Maintain a healthy weight  Body mass index (BMI) is a measurement that can be used to identify possible weight problems. It estimates body fat based on height and weight. Your health care provider can help determine your BMI and help you achieve or maintain a healthy weight.  For females 20 years of age and older:   A BMI below 18.5 is considered underweight.  A BMI of 18.5 to 24.9 is normal.  A BMI of 25 to 29.9 is considered overweight.  A BMI of 30 and above is considered obese.  Watch levels of cholesterol and blood lipids  You should start having your blood tested for lipids and cholesterol at 44 years of age, then have this test every 5 years.  You may need to have your cholesterol levels checked more often if:  Your lipid  or cholesterol levels are high.  You are older than 44 years of age.  You are at high risk for heart disease.  CANCER SCREENING   Lung Cancer  Lung cancer screening is recommended for adults 55-80 years old who are at high risk for lung cancer because of a history of smoking.  A yearly low-dose CT scan of the lungs is recommended for people who:  Currently smoke.  Have quit within the past 15 years.  Have at least a 30-pack-year history of smoking. A pack year is smoking an average of one pack of cigarettes a day for 1 year.  Yearly screening should continue until it has been 15 years since you quit.  Yearly screening should stop if you develop a health problem that would prevent you from having lung cancer treatment.  Breast Cancer  Practice breast self-awareness. This means understanding how your breasts normally appear and feel.  It also means doing regular breast self-exams. Let your health care provider know about any changes, no matter how small.  If you are in your 20s or 30s, you should have a clinical breast exam (CBE) by a health care provider every 1-3 years as part of a regular health exam.  If you are 40 or older, have a CBE every year. Also consider having a breast X-ray (mammogram) every year.  If you have a family history of breast cancer, talk to your health care provider about genetic screening.  If you   are at high risk for breast cancer, talk to your health care provider about having an MRI and a mammogram every year.  Breast cancer gene (BRCA) assessment is recommended for women who have family members with BRCA-related cancers. BRCA-related cancers include:  Breast.  Ovarian.  Tubal.  Peritoneal cancers.  Results of the assessment will determine the need for genetic counseling and BRCA1 and BRCA2 testing. Cervical Cancer Your health care provider may recommend that you be screened regularly for cancer of the pelvic organs (ovaries, uterus, and  vagina). This screening involves a pelvic examination, including checking for microscopic changes to the surface of your cervix (Pap test). You may be encouraged to have this screening done every 3 years, beginning at age 21.  For women ages 30-65, health care providers may recommend pelvic exams and Pap testing every 3 years, or they may recommend the Pap and pelvic exam, combined with testing for human papilloma virus (HPV), every 5 years. Some types of HPV increase your risk of cervical cancer. Testing for HPV may also be done on women of any age with unclear Pap test results.  Other health care providers may not recommend any screening for nonpregnant women who are considered low risk for pelvic cancer and who do not have symptoms. Ask your health care provider if a screening pelvic exam is right for you.  If you have had past treatment for cervical cancer or a condition that could lead to cancer, you need Pap tests and screening for cancer for at least 20 years after your treatment. If Pap tests have been discontinued, your risk factors (such as having a new sexual partner) need to be reassessed to determine if screening should resume. Some women have medical problems that increase the chance of getting cervical cancer. In these cases, your health care provider may recommend more frequent screening and Pap tests. Colorectal Cancer  This type of cancer can be detected and often prevented.  Routine colorectal cancer screening usually begins at 44 years of age and continues through 44 years of age.  Your health care provider may recommend screening at an earlier age if you have risk factors for colon cancer.  Your health care provider may also recommend using home test kits to check for hidden blood in the stool.  A small camera at the end of a tube can be used to examine your colon directly (sigmoidoscopy or colonoscopy). This is done to check for the earliest forms of colorectal  cancer.  Routine screening usually begins at age 50.  Direct examination of the colon should be repeated every 5-10 years through 44 years of age. However, you may need to be screened more often if early forms of precancerous polyps or small growths are found. Skin Cancer  Check your skin from head to toe regularly.  Tell your health care provider about any new moles or changes in moles, especially if there is a change in a mole's shape or color.  Also tell your health care provider if you have a mole that is larger than the size of a pencil eraser.  Always use sunscreen. Apply sunscreen liberally and repeatedly throughout the day.  Protect yourself by wearing long sleeves, pants, a wide-brimmed hat, and sunglasses whenever you are outside. HEART DISEASE, DIABETES, AND HIGH BLOOD PRESSURE   High blood pressure causes heart disease and increases the risk of stroke. High blood pressure is more likely to develop in:  People who have blood pressure in the high end   of the normal range (130-139/85-89 mm Hg).  People who are overweight or obese.  People who are African American.  If you are 38-23 years of age, have your blood pressure checked every 3-5 years. If you are 61 years of age or older, have your blood pressure checked every year. You should have your blood pressure measured twice--once when you are at a hospital or clinic, and once when you are not at a hospital or clinic. Record the average of the two measurements. To check your blood pressure when you are not at a hospital or clinic, you can use:  An automated blood pressure machine at a pharmacy.  A home blood pressure monitor.  If you are between 45 years and 39 years old, ask your health care provider if you should take aspirin to prevent strokes.  Have regular diabetes screenings. This involves taking a blood sample to check your fasting blood sugar level.  If you are at a normal weight and have a low risk for diabetes,  have this test once every three years after 44 years of age.  If you are overweight and have a high risk for diabetes, consider being tested at a younger age or more often. PREVENTING INFECTION  Hepatitis B  If you have a higher risk for hepatitis B, you should be screened for this virus. You are considered at high risk for hepatitis B if:  You were born in a country where hepatitis B is common. Ask your health care provider which countries are considered high risk.  Your parents were born in a high-risk country, and you have not been immunized against hepatitis B (hepatitis B vaccine).  You have HIV or AIDS.  You use needles to inject street drugs.  You live with someone who has hepatitis B.  You have had sex with someone who has hepatitis B.  You get hemodialysis treatment.  You take certain medicines for conditions, including cancer, organ transplantation, and autoimmune conditions. Hepatitis C  Blood testing is recommended for:  Everyone born from [birth year] through 1964.  Anyone with known risk factors for hepatitis C. Sexually transmitted infections (STIs)  You should be screened for sexually transmitted infections (STIs) including gonorrhea and chlamydia if:  You are sexually active and are younger than 44 years of age.  You are older than 44 years of age and your health care provider tells you that you are at risk for this type of infection.  Your sexual activity has changed since you were last screened and you are at an increased risk for chlamydia or gonorrhea. Ask your health care provider if you are at risk.  If you do not have HIV, but are at risk, it may be recommended that you take a prescription medicine daily to prevent HIV infection. This is called pre-exposure prophylaxis (PrEP). You are considered at risk if:  You are sexually active and do not regularly use condoms or know the HIV status of your partner(s).  You take drugs by injection.  You are sexually  active with a partner who has HIV. Talk with your health care provider about whether you are at high risk of being infected with HIV. If you choose to begin PrEP, you should first be tested for HIV. You should then be tested every 3 months for as long as you are taking PrEP.  PREGNANCY   If you are premenopausal and you may become pregnant, ask your health care provider about preconception counseling.  If you may  become pregnant, take 400 to 800 micrograms (mcg) of folic acid every day.  If you want to prevent pregnancy, talk to your health care provider about birth control (contraception). OSTEOPOROSIS AND MENOPAUSE   Osteoporosis is a disease in which the bones lose minerals and strength with aging. This can result in serious bone fractures. Your risk for osteoporosis can be identified using a bone density scan.  If you are 61 years of age or older, or if you are at risk for osteoporosis and fractures, ask your health care provider if you should be screened.  Ask your health care provider whether you should take a calcium or vitamin D supplement to lower your risk for osteoporosis.  Menopause may have certain physical symptoms and risks.  Hormone replacement therapy may reduce some of these symptoms and risks. Talk to your health care provider about whether hormone replacement therapy is right for you.  HOME CARE INSTRUCTIONS   Schedule regular health, dental, and eye exams.  Stay current with your immunizations.   Do not use any tobacco products including cigarettes, chewing tobacco, or electronic cigarettes.  If you are pregnant, do not drink alcohol.  If you are breastfeeding, limit how much and how often you drink alcohol.  Limit alcohol intake to no more than 1 drink per day for nonpregnant women. One drink equals 12 ounces of beer, 5 ounces of wine, or 1 ounces of hard liquor.  Do not use street drugs.  Do not share needles.  Ask your health care provider for help if  you need support or information about quitting drugs.  Tell your health care provider if you often feel depressed.  Tell your health care provider if you have ever been abused or do not feel safe at home.   This information is not intended to replace advice given to you by your health care provider. Make sure you discuss any questions you have with your health care provider.   Document Released: 06/11/2011 Document Revised: 12/17/2014 Document Reviewed: 10/28/2013 Elsevier Interactive Patient Education [employer].

## 2016-04-05 NOTE — Assessment & Plan Note (Signed)
Controlled on Prilosec Discussed how weight loss could help improve her reflux

## 2016-04-05 NOTE — Progress Notes (Signed)
Subjective:    Patient ID: Anne Mcguire, female    DOB: Aug 07, 1971, 45 y.o.   MRN: [id number]  HPI  Pt presents to the clinic today for her annual exam. She is also due for 6 month follow up of chronic conditions.  GAD: Her Prozac was increased to 40 mg at her last visit. She declined RX for Xanax because she felt like it made her groggy the next day. She denies depression, SI/HI.  GERD: Her reflux is triggered by eating spicy foods or tomato based products. She does feel like her symptoms are well controlled on Prilosec.  Hypothyroidism: She denies any issues on her current dose of Synthroid (100 mcg). She has felt more fatigued than prior. She denies cold intolerance, constipation, weight gain, hair loss or depression. She is due for repeat labs today.  Insomnia: She is sleeping well with Trazadone. She gets about 7-8 hours of sleep per night. She has not had any adverse effects from the medication.  Flu: 09/2015 Tetanus: 08/2012 Pap Smear: 01/2016 at [facility] Mammogram: 01/2016 at [facility] Vision Screening: annually Dentist: biannually  Diet: She does eat meat. She does consume some fried foods. She eats fruits daily, veggies occasionally. She drinks coffee and water.  Exercise: None.   Review of Systems      Past Medical History  Diagnosis Date  . Chicken pox   . Depression   . Migraines   . Thyroid disease   . Anxiety   . GERD (gastroesophageal reflux disease)   . Hypertension   . Hyperlipidemia   . History of stomach ulcers     Current Outpatient Prescriptions  Medication Sig Dispense Refill  . B Complex-C (SUPER B COMPLEX/VITAMIN C PO) Take 1 capsule by mouth daily.    Marland Kitchen FLUoxetine (PROZAC) 40 MG capsule Take 40 mg by mouth.   1  . fluticasone (FLONASE) 50 MCG/ACT nasal spray Place 2 sprays into both nostrils daily.    Marland Kitchen levonorgestrel (MIRENA) 20 MCG/24HR IUD 1 each by Intrauterine route once. Inserted 03/01/2016    .  levothyroxine (SYNTHROID, LEVOTHROID) 100 MCG tablet TAKE 1 TABLET BY MOUTH DAILY BEFORE BREAKFAST. 30 tablet 3  . omeprazole (PRILOSEC) 20 MG capsule TAKE 1 CAPSULE (20 MG TOTAL) BY MOUTH DAILY. 90 capsule 0  . traZODone (DESYREL) 50 MG tablet TAKE 0.5-1 TABLETS (25-50 MG TOTAL) BY MOUTH AT BEDTIME AS NEEDED. FOR SLEEP 90 tablet 0   No current facility-administered medications for this visit.    Allergies  Allergen Reactions  . Azithromycin Hives    Family History  Problem Relation Age of Onset  . Mental illness Mother   . Arthritis Father   . Hyperlipidemia Father   . Hypertension Father   . Diabetes Father   . Arthritis Maternal Grandmother   . Diabetes Maternal Grandmother   . Arthritis Maternal Grandfather   . Diabetes Maternal Grandfather   . Arthritis Paternal Grandmother   . Breast cancer Paternal Grandmother     Social History   Social History  . Marital Status: Married    Spouse Name: N/A  . Number of Children: N/A  . Years of Education: N/A   Occupational History  . Not on file.   Social History Main Topics  . Smoking status: Never Smoker   . Smokeless tobacco: Not on file  . Alcohol Use: Yes     Comment: occasional  . Drug Use: No  . Sexual Activity: Yes   Other Topics  Concern  . Not on file   Social History Narrative     Constitutional: Pt reports fatigue. Denies fever, malaise, headache or abrupt weight changes.  HEENT: Pt reports dry mouth. Denies eye pain, eye redness, ear pain, ringing in the ears, wax buildup, runny nose, nasal congestion, bloody nose, or sore throat. Respiratory: Denies difficulty breathing, shortness of breath, cough or sputum production.   Cardiovascular: Denies chest pain, chest tightness, palpitations or swelling in the hands or feet.  Gastrointestinal: Denies abdominal pain, bloating, constipation, diarrhea or blood in the stool.  GU: Pt reports nocturia. Denies urgency, frequency, pain with urination, burning  sensation, blood in urine, odor or discharge. Musculoskeletal: Pt reports right upper arm pain. Denies decrease in range of motion, difficulty with gait, or joint pain and swelling.  Skin: Pt feels like her lymph nodes are swollen under her right armpit. Denies redness, rashes, lesions or ulcercations.  Neurological: Denies dizziness, difficulty with memory, difficulty with speech or problems with balance and coordination.  Psych: Pt reports anxiety. Denies depression, SI/HI.  No other specific complaints in a complete review of systems (except as listed in HPI above).   Objective:   Physical Exam  BP 130/82 mmHg  Pulse 83  Temp(Src) 98.3 F (36.8 C) (Oral)  Ht 5\' 4"  (1.626 m)  Wt 228 lb (103.42 kg)  BMI 39.12 kg/m2  SpO2 98% Wt Readings from Last 3 Encounters:  04/05/16 228 lb (103.42 kg)  10/06/15 223 lb (101.152 kg)  04/05/15 222 lb (100.699 kg)    General: Appears her stated age, obese in NAD. Skin: Warm, dry and intact.  HEENT: Head: normal shape and size; Eyes: sclera white, no icterus, conjunctiva pink, PERRLA and EOMs intact; Ears: Tm's gray and intact, normal light reflex; Throat/Mouth: Teeth present, mucosa pink and moist, no exudate, lesions or ulcerations noted.  Neck:  Neck supple, trachea midline. No masses, lumps or thyromegaly present.  Cardiovascular: Normal rate and rhythm. S1,S2 noted.  No murmur, rubs or gallops noted. No JVD or BLE edema.  Pulmonary/Chest: Normal effort and positive vesicular breath sounds. No respiratory distress. No wheezes, rales or ronchi noted.  Abdomen: Soft and nontender. Normal bowel sounds. No distention or masses noted. Liver, spleen and kidneys non palpable. Musculoskeletal: Normal internal and external range of motion of the bilateral shoulders. Strength 5/5 BUE/BLE. No signs of joint swelling. No difficulty with gait.  Neurological: Alert and oriented. Cranial nerves II-XII grossly intact. Coordination normal.  Psychiatric: She is  slightly anxious. Behavior is normal. Judgment and thought content normal.     BMET    Component Value Date/Time   NA 139 10/06/2015 0916   K 4.1 10/06/2015 0916   CL 102 10/06/2015 0916   CO2 29 10/06/2015 0916   GLUCOSE 109* 10/06/2015 0916   BUN 13 10/06/2015 0916   CREATININE 0.77 10/06/2015 0916   CALCIUM 9.3 10/06/2015 0916    Lipid Panel     Component Value Date/Time   CHOL 188 04/05/2015 0845   TRIG 136.0 04/05/2015 0845   HDL 48.20 04/05/2015 0845   CHOLHDL 4 04/05/2015 0845   VLDL 27.2 04/05/2015 0845   LDLCALC 113* 04/05/2015 0845    CBC    Component Value Date/Time   WBC 9.0 10/06/2015 0916   RBC 4.70 10/06/2015 0916   HGB 13.7 10/06/2015 0916   HCT 41.4 10/06/2015 0916   PLT 162.0 10/06/2015 0916   MCV 88.0 10/06/2015 0916   MCHC 33.1 10/06/2015 0916   RDW 13.9  10/06/2015 0916    Hgb A1C Lab Results  Component Value Date   HGBA1C 5.9 04/05/2015        Assessment & Plan:   Preventative Health Maintenance:  Flu and Tetanus UTD Pap smear and mammogram UTD Encouraged her to consume a balance diet and start an exercise program Encouraged her to see a dentist and eye doctor annually CBC, CMET, Lipid, A1C and HIV today  Right arm pain:  Likely strained muscle Discussed stretching techniques Advil as needed for pain and inflammation  RTC in 6 months to follow up chronic conditions

## 2016-04-05 NOTE — Addendum Note (Signed)
Addended by: Daralene Milch C on: 04/05/2016 04:01 PM   Modules accepted: Miquel Dunn

## 2016-04-05 NOTE — Assessment & Plan Note (Signed)
Controlled on Prozac

## 2016-04-06 LAB — HIV ANTIBODY (ROUTINE TESTING W REFLEX): HIV 1&2 Ab, 4th Generation: NONREACTIVE

## 2016-04-11 ENCOUNTER — Other Ambulatory Visit: Payer: Self-pay | Admitting: Internal Medicine

## 2016-04-17 DIAGNOSIS — Z30431 Encounter for routine checking of intrauterine contraceptive device: Secondary | ICD-10-CM | POA: Diagnosis not present

## 2016-05-28 ENCOUNTER — Encounter: Payer: Self-pay | Admitting: Internal Medicine

## 2016-06-01 DIAGNOSIS — K080 Exfoliation of teeth due to systemic causes: Secondary | ICD-10-CM | POA: Diagnosis not present

## 2016-06-08 ENCOUNTER — Encounter: Payer: Self-pay | Admitting: Internal Medicine

## 2016-06-08 MED ORDER — ONDANSETRON HCL 4 MG PO TABS: 4.0000 mg | ORAL_TABLET | Freq: Three times a day (TID) | ORAL | Status: DC | PRN

## 2016-06-08 NOTE — Addendum Note (Signed)
Addended by: Jearld Fenton on: 06/08/2016 09:32 AM   Modules accepted: Orders

## 2016-07-23 DIAGNOSIS — Z30431 Encounter for routine checking of intrauterine contraceptive device: Secondary | ICD-10-CM | POA: Diagnosis not present

## 2016-07-23 DIAGNOSIS — Z3200 Encounter for pregnancy test, result unknown: Secondary | ICD-10-CM | POA: Diagnosis not present

## 2016-07-23 DIAGNOSIS — N939 Abnormal uterine and vaginal bleeding, unspecified: Secondary | ICD-10-CM | POA: Diagnosis not present

## 2016-07-25 ENCOUNTER — Ambulatory Visit (INDEPENDENT_AMBULATORY_CARE_PROVIDER_SITE_OTHER): Payer: Federal, State, Local not specified - PPO | Admitting: Internal Medicine

## 2016-07-25 ENCOUNTER — Encounter: Payer: Self-pay | Admitting: Internal Medicine

## 2016-07-25 VITALS — BP 134/86 | HR 78 | Temp 98.1°F | Wt 232.0 lb

## 2016-07-25 DIAGNOSIS — N938 Other specified abnormal uterine and vaginal bleeding: Secondary | ICD-10-CM

## 2016-07-25 DIAGNOSIS — R030 Elevated blood-pressure reading, without diagnosis of hypertension: Secondary | ICD-10-CM

## 2016-07-25 DIAGNOSIS — IMO0001 Reserved for inherently not codable concepts without codable children: Secondary | ICD-10-CM

## 2016-07-25 DIAGNOSIS — R5383 Other fatigue: Secondary | ICD-10-CM | POA: Diagnosis not present

## 2016-07-25 DIAGNOSIS — F411 Generalized anxiety disorder: Secondary | ICD-10-CM

## 2016-07-25 DIAGNOSIS — R0789 Other chest pain: Secondary | ICD-10-CM | POA: Diagnosis not present

## 2016-07-25 NOTE — Progress Notes (Signed)
Pre visit review using our clinic review tool, if applicable. No additional management support is needed unless otherwise documented below in the visit note. 

## 2016-07-25 NOTE — Patient Instructions (Signed)

## 2016-07-25 NOTE — Progress Notes (Signed)
Subjective:    Patient ID: Anne Mcguire, female    DOB: May 23, 1971, 45 y.o.   MRN: [id number]  HPI  Pt presents to the clinic today with multiple complaints.   HTN: She went to her GYN last week and at that visit, her BP was 160/100. She reports she was a little nervous at that visit because she was having some issues and she wasn't sure what was going to be done at that visit. She has not been having headaches, blurred vision or dizziness. She has been having some chest tightness but attributes that to stress and anxiety. She has been treated for HTN in the past but was taken off because the anxiety medication was keeping her BP down. Her BP today is 134/86.  Dysfunctional Uterine Bleeding: This has been going on for months. She saw GYN last week. She had a negative urine pregnancy test and they did an ultrasound which showed a fibroid and cyst on one of her ovaries. They were going to start her on OCP's but was concerned that her BP was too high. They did discuss doing a uterine ablation if they would not be able to try OCP's.  Anxiety: This is a chronic issue for her and sometimes seem worse than others. She is under a lot of stress right now. She is taking Prozac and feel like it is minimally effective. She does not see a therapist. She recently started yoga.   She also c/o left side rib pain. She reports this started 1 month ago after a vomiting episode. The pain has been consistent during that time but has lessened in severity. She describes the pain as an ache but can be sharp and stabbing with certain movements. She has not taken anything OTC for this.  She also c/o ongoing fatigue. She is getting 7 hours of sleep at night. She does sometimes feel rested when she wakes up. She does often feel tired throughout the day. She does snore at night. She has never been told that she has sleep apnea. She is concerned that the Synthroid is causing her to feel this way and she wants to consider  coming off it. She does report increase stress but denies depression.   Review of Systems      Past Medical History:  Diagnosis Date  . Anxiety   . Chicken pox   . Depression   . GERD (gastroesophageal reflux disease)   . History of stomach ulcers   . Hyperlipidemia   . Hypertension   . Migraines   . Thyroid disease     Current Outpatient Prescriptions  Medication Sig Dispense Refill  . B Complex-C (SUPER B COMPLEX/VITAMIN C PO) Take 1 capsule by mouth daily.    Marland Kitchen FLUoxetine (PROZAC) 40 MG capsule Take 1 capsule (40 mg total) by mouth daily. 90 capsule 1  . fluticasone (FLONASE) 50 MCG/ACT nasal spray Place 2 sprays into both nostrils daily.    Marland Kitchen levonorgestrel (MIRENA) 20 MCG/24HR IUD 1 each by Intrauterine route once. Inserted 03/01/2016    . levothyroxine (SYNTHROID, LEVOTHROID) 100 MCG tablet TAKE 1 TABLET BY MOUTH DAILY BEFORE BREAKFAST. 30 tablet 5  . omeprazole (PRILOSEC) 20 MG capsule TAKE 1 CAPSULE (20 MG TOTAL) BY MOUTH DAILY. 90 capsule 1  . ondansetron (ZOFRAN) 4 MG tablet Take 1 tablet (4 mg total) by mouth every 8 (eight) hours as needed. 20 tablet 0  . traZODone (DESYREL) 50 MG tablet TAKE 0.5-1 TABLETS (25-50 MG TOTAL) BY  MOUTH AT BEDTIME AS NEEDED. FOR SLEEP 90 tablet 1   No current facility-administered medications for this visit.     Allergies  Allergen Reactions  . Azithromycin Hives    Family History  Problem Relation Age of Onset  . Mental illness Mother   . Arthritis Father   . Hyperlipidemia Father   . Hypertension Father   . Diabetes Father   . Arthritis Maternal Grandmother   . Diabetes Maternal Grandmother   . Arthritis Maternal Grandfather   . Diabetes Maternal Grandfather   . Arthritis Paternal Grandmother   . Breast cancer Paternal Grandmother     Social History   Social History  . Marital status: Married    Spouse name: N/A  . Number of children: N/A  . Years of education: N/A   Occupational History  . Not on file.   Social  History Main Topics  . Smoking status: Never Smoker  . Smokeless tobacco: Not on file  . Alcohol use Yes     Comment: occasional  . Drug use: No  . Sexual activity: Yes   Other Topics Concern  . Not on file   Social History Narrative  . No narrative on file     Constitutional: Pt reports fatigue. Denies fever, malaise, headache or abrupt weight changes.  Respiratory: Denies difficulty breathing, shortness of breath, cough or sputum production.   Cardiovascular: Pt reports chest tightness. Denies chest pain,, palpitations or swelling in the hands or feet.  Gastrointestinal: Denies abdominal pain, bloating, constipation, diarrhea or blood in the stool.  GU: Pt reports uterine bleeding. Denies urgency, frequency, pain with urination, burning sensation, blood in urine, odor or discharge. Musculoskeletal: Pt reports left chest wall pain. Denies decrease in range of motion, difficulty with gait, or joint pain and swelling.  Skin: Denies redness, rashes, lesions or ulcercations.  Neurological: Denies dizziness, difficulty with memory, difficulty with speech or problems with balance and coordination.  Psych: Pt reports anxiety. Denies depression, SI/HI.  No other specific complaints in a complete review of systems (except as listed in HPI above).  Objective:   Physical Exam  BP 134/86   Pulse 78   Temp 98.1 F (36.7 C) (Oral)   Wt 232 lb (105.2 kg)   BMI 39.82 kg/m  Wt Readings from Last 3 Encounters:  07/25/16 232 lb (105.2 kg)  04/05/16 228 lb (103.4 kg)  10/06/15 223 lb (101.2 kg)    General: Appears her stated age, obese in NAD. Skin: Warm, dry and intact. No rashes, lesions or ulcerations noted of left chest wall. Cardiovascular: Normal rate and rhythm. S1,S2 noted.  No murmur, rubs or gallops noted.  Pulmonary/Chest: Normal effort and positive vesicular breath sounds. No respiratory distress. No wheezes, rales or ronchi noted.  Abdomen: Soft and nontender. Normal bowel  sounds. No distention or masses noted.  Musculoskeletal: Tender to palpation of the left lower lateral ribs. Neurological: Alert and oriented.  Psychiatric: She is mildly anxious today.  BMET    Component Value Date/Time   NA 140 04/05/2016 0904   K 3.7 04/05/2016 0904   CL 102 04/05/2016 0904   CO2 32 04/05/2016 0904   GLUCOSE 103 (H) 04/05/2016 0904   BUN 12 04/05/2016 0904   CREATININE 0.67 04/05/2016 0904   CALCIUM 8.9 04/05/2016 0904    Lipid Panel     Component Value Date/Time   CHOL 184 04/05/2016 0904   TRIG 121.0 04/05/2016 0904   HDL 45.70 04/05/2016 0904   CHOLHDL 4  04/05/2016 0904   VLDL 24.2 04/05/2016 0904   LDLCALC 114 (H) 04/05/2016 0904    CBC    Component Value Date/Time   WBC 7.2 04/05/2016 0904   RBC 4.43 04/05/2016 0904   HGB 12.7 04/05/2016 0904   HCT 38.3 04/05/2016 0904   PLT 166.0 04/05/2016 0904   MCV 86.6 04/05/2016 0904   MCHC 33.1 04/05/2016 0904   RDW 14.5 04/05/2016 0904    Hgb A1C Lab Results  Component Value Date   HGBA1C 6.2 04/05/2016            Assessment & Plan:   Elevated blood pressure:  I don not think we need to restart her BP medication at this time Encouraged low salt diet and exercise to lose weight to see if that helps Will monitor  Anxiety:  Ongoing issue Discussed increasing Prozac or adding Wellbutrin/Buspar- she declines at this time She wants to see if improves with Yoga  DUB:  I think she is fine to start OCP's She will discuss this with her GYN   Left chest wall pain:  Encouraged her to try stretching A heating pad may be helpful Ibuprofen as needed  Fatigue:  I think she may have OSA She declines referral to pulmonology at this time Encouraged weight loss Continue Yoga  RTC as needed or if symptoms persist or worsen Gudelia Eugene, NP

## 2016-08-06 ENCOUNTER — Encounter: Payer: Self-pay | Admitting: Internal Medicine

## 2016-08-16 DIAGNOSIS — N939 Abnormal uterine and vaginal bleeding, unspecified: Secondary | ICD-10-CM | POA: Diagnosis not present

## 2016-09-12 ENCOUNTER — Other Ambulatory Visit: Payer: Self-pay

## 2016-09-12 MED ORDER — LEVOTHYROXINE SODIUM 100 MCG PO TABS: ORAL_TABLET | ORAL | 0 refills | Status: DC

## 2016-09-20 ENCOUNTER — Encounter: Payer: Self-pay | Admitting: Internal Medicine

## 2016-10-08 ENCOUNTER — Telehealth: Payer: Self-pay | Admitting: Internal Medicine

## 2016-10-08 ENCOUNTER — Ambulatory Visit: Payer: Federal, State, Local not specified - PPO | Admitting: Internal Medicine

## 2016-10-08 NOTE — Telephone Encounter (Signed)
Patient did not come in for their appointment today for 6 mo follow up  Please let me know if patient needs to be contacted immediately for follow up or no follow up needed. °

## 2016-10-08 NOTE — Telephone Encounter (Signed)
This apt was trying to be rescheduled because I was supposed to be covering [facility] for Dr. Silvio Pate. Appt needs to be reschedules and taken off today's schedule

## 2016-10-10 ENCOUNTER — Other Ambulatory Visit: Payer: Self-pay | Admitting: Internal Medicine

## 2016-10-10 DIAGNOSIS — F411 Generalized anxiety disorder: Secondary | ICD-10-CM

## 2016-10-19 ENCOUNTER — Telehealth: Payer: Self-pay | Admitting: Internal Medicine

## 2016-10-19 ENCOUNTER — Ambulatory Visit: Payer: Federal, State, Local not specified - PPO

## 2016-10-19 NOTE — Telephone Encounter (Signed)
Pt called stating she received a call canceling her 10/30 appointment, then she received a no show notice.  She wanted to make sure she didn't get charged a $50 no show fee

## 2016-10-20 DIAGNOSIS — H34231 Retinal artery branch occlusion, right eye: Secondary | ICD-10-CM | POA: Diagnosis not present

## 2016-10-20 DIAGNOSIS — H524 Presbyopia: Secondary | ICD-10-CM | POA: Diagnosis not present

## 2016-10-20 DIAGNOSIS — H25043 Posterior subcapsular polar age-related cataract, bilateral: Secondary | ICD-10-CM | POA: Diagnosis not present

## 2016-10-20 DIAGNOSIS — H35033 Hypertensive retinopathy, bilateral: Secondary | ICD-10-CM | POA: Diagnosis not present

## 2016-10-23 ENCOUNTER — Encounter: Payer: Self-pay | Admitting: Internal Medicine

## 2016-10-23 ENCOUNTER — Ambulatory Visit (INDEPENDENT_AMBULATORY_CARE_PROVIDER_SITE_OTHER): Payer: Federal, State, Local not specified - PPO | Admitting: Internal Medicine

## 2016-10-23 VITALS — BP 132/82 | HR 71 | Temp 98.5°F | Wt 227.0 lb

## 2016-10-23 DIAGNOSIS — F411 Generalized anxiety disorder: Secondary | ICD-10-CM

## 2016-10-23 DIAGNOSIS — J0100 Acute maxillary sinusitis, unspecified: Secondary | ICD-10-CM | POA: Diagnosis not present

## 2016-10-23 DIAGNOSIS — Z23 Encounter for immunization: Secondary | ICD-10-CM | POA: Diagnosis not present

## 2016-10-23 MED ORDER — AMOXICILLIN 875 MG PO TABS: 875.0000 mg | ORAL_TABLET | Freq: Two times a day (BID) | ORAL | 0 refills | Status: DC

## 2016-10-23 MED ORDER — FLUOXETINE HCL 40 MG PO CAPS: 40.0000 mg | ORAL_CAPSULE | Freq: Two times a day (BID) | ORAL | 1 refills | Status: DC

## 2016-10-23 NOTE — Patient Instructions (Addendum)

## 2016-10-24 ENCOUNTER — Encounter: Payer: Self-pay | Admitting: Internal Medicine

## 2016-10-24 NOTE — Telephone Encounter (Signed)
Rose,  Can you remove the no show fee for this appt?  Thank you

## 2016-10-24 NOTE — Progress Notes (Signed)
Subjective:    Patient ID: Anne Mcguire, female    DOB: [birth year], 45 y.o.   MRN: [id number]  HPI  Pt presents to the clinic today to follow up anxiety. She reports she recently increased her Prozac to 40 mg BID. She reports this has been working very well for her. She has not noticed any adverse side effects. She is requesting a refill of the Prozac today.  Additionally, she reports headache, facial pain and pressure, ear fullness, nasal congestion and sore throat. This started 2 weeks ago. She is blowing blood tinged green mucous out of her nose. She denies difficulty swallowing. She has run low grade fevers, had chills and body aches. She has taken Flonase and Sudafed with minimal relief. She has not had sick contacts that she is aware of.    Review of Systems      Past Medical History:  Diagnosis Date  . Anxiety   . Chicken pox   . Depression   . GERD (gastroesophageal reflux disease)   . History of stomach ulcers   . Hyperlipidemia   . Hypertension   . Migraines   . Thyroid disease     Current Outpatient Prescriptions  Medication Sig Dispense Refill  . B Complex-C (SUPER B COMPLEX/VITAMIN C PO) Take 1 capsule by mouth daily.    . fluticasone (FLONASE) 50 MCG/ACT nasal spray Place 2 sprays into both nostrils daily.    Marland Kitchen levonorgestrel (MIRENA) 20 MCG/24HR IUD 1 each by Intrauterine route once. Inserted 03/01/2016    . levothyroxine (SYNTHROID, LEVOTHROID) 100 MCG tablet TAKE 1 TABLET BY MOUTH DAILY BEFORE BREAKFAST. 90 tablet 0  . omeprazole (PRILOSEC) 20 MG capsule TAKE 1 CAPSULE (20 MG TOTAL) BY MOUTH DAILY. 90 capsule 1  . ondansetron (ZOFRAN) 4 MG tablet Take 1 tablet (4 mg total) by mouth every 8 (eight) hours as needed. 20 tablet 0  . traZODone (DESYREL) 50 MG tablet TAKE 0.5-1 TABLETS (25-50 MG TOTAL) BY MOUTH AT BEDTIME AS NEEDED. FOR SLEEP 90 tablet 1  . amoxicillin (AMOXIL) 875 MG tablet Take 1 tablet (875 mg total) by mouth 2 (two) times daily. 20 tablet 0    . FLUoxetine (PROZAC) 40 MG capsule Take 1 capsule (40 mg total) by mouth 2 (two) times daily. 180 capsule 1   No current facility-administered medications for this visit.     Allergies  Allergen Reactions  . Azithromycin Hives    Family History  Problem Relation Age of Onset  . Mental illness Mother   . Arthritis Father   . Hyperlipidemia Father   . Hypertension Father   . Diabetes Father   . Arthritis Maternal Grandmother   . Diabetes Maternal Grandmother   . Arthritis Maternal Grandfather   . Diabetes Maternal Grandfather   . Arthritis Paternal Grandmother   . Breast cancer Paternal Grandmother     Social History   Social History  . Marital status: Married    Spouse name: N/A  . Number of children: N/A  . Years of education: N/A   Occupational History  . Not on file.   Social History Main Topics  . Smoking status: Never Smoker  . Smokeless tobacco: Never Used  . Alcohol use Yes     Comment: occasional  . Drug use: No  . Sexual activity: Yes   Other Topics Concern  . Not on file   Social History Narrative  . No narrative on file     Constitutional: Pt reports headache, fatigue,  fever. Denies abrupt weight changes.  HEENT: Pt reports ear fullness, nasal congestion, sore throat. Denies eye pain, eye redness, ear pain, ringing in the ears, wax buildup, runny nose, bloody nose. Respiratory: Denies difficulty breathing, shortness of breath, cough or sputum production.   Neurological: Denies dizziness, difficulty with memory, difficulty with speech or problems with balance and coordination.  Psych:Pt reports anxiety. Denies depression, SI/HI.  No other specific complaints in a complete review of systems (except as listed in HPI above).  Objective:   Physical Exam   BP 132/82   Pulse 71   Temp 98.5 F (36.9 C) (Oral)   Wt 227 lb (103 kg)   SpO2 98%   BMI 38.96 kg/m  Wt Readings from Last 3 Encounters:  10/23/16 227 lb (103 kg)  07/25/16 232 lb  (105.2 kg)  04/05/16 228 lb (103.4 kg)    General: Appears her stated age, in NAD. HEENT: Head: normal shape and size, maxillary sinus tenderness noted; Eyes: sclera white, no icterus, conjunctiva pink; Ears: Tm's gray and intact, normal light reflex, + serous effusion bilaterally; Nose: mucosa pink and moist, septum midline; Throat/Mouth: Teeth present, mucosa erythematous and moist, + PND,no exudate, lesions or ulcerations noted.  Neck:  No adenopathy noted. Pulmonary/Chest: Normal effort and positive vesicular breath sounds. No respiratory distress. No wheezes, rales or ronchi noted.  Neurological: Alert and oriented. Psychiatric: Mood and affect normal. Behavior is normal. Judgment and thought content normal.    BMET    Component Value Date/Time   NA 140 04/05/2016 0904   K 3.7 04/05/2016 0904   CL 102 04/05/2016 0904   CO2 32 04/05/2016 0904   GLUCOSE 103 (H) 04/05/2016 0904   BUN 12 04/05/2016 0904   CREATININE 0.67 04/05/2016 0904   CALCIUM 8.9 04/05/2016 0904    Lipid Panel     Component Value Date/Time   CHOL 184 04/05/2016 0904   TRIG 121.0 04/05/2016 0904   HDL 45.70 04/05/2016 0904   CHOLHDL 4 04/05/2016 0904   VLDL 24.2 04/05/2016 0904   LDLCALC 114 (H) 04/05/2016 0904    CBC    Component Value Date/Time   WBC 7.2 04/05/2016 0904   RBC 4.43 04/05/2016 0904   HGB 12.7 04/05/2016 0904   HCT 38.3 04/05/2016 0904   PLT 166.0 04/05/2016 0904   MCV 86.6 04/05/2016 0904   MCHC 33.1 04/05/2016 0904   RDW 14.5 04/05/2016 0904    Hgb A1C Lab Results  Component Value Date   HGBA1C 6.2 04/05/2016           Assessment & Plan:   Acute Sinusitis:  Can use a Neti Pot OTC Continue Flonase eRx for Amoxil 875 mg BID x 10 days  Anxiety:  Controlled on Prozac 40 mg BID Refill sent to pharmacy  RTC as needed or if symptoms persist or worsen Webb Silversmith, NP

## 2016-10-25 NOTE — Telephone Encounter (Signed)
This no show fee has not been billed yet and needs to be "corrected" on the apptmt desk.  Appointment statistics needs to be corrected and I do not have access to that.   Thank you!

## 2016-10-26 ENCOUNTER — Other Ambulatory Visit: Payer: Self-pay | Admitting: Internal Medicine

## 2016-11-27 ENCOUNTER — Other Ambulatory Visit: Payer: Self-pay

## 2016-11-27 MED ORDER — LEVOTHYROXINE SODIUM 100 MCG PO TABS: 100.0000 ug | ORAL_TABLET | Freq: Every day | ORAL | 1 refills | Status: DC

## 2016-12-06 DIAGNOSIS — D2261 Melanocytic nevi of right upper limb, including shoulder: Secondary | ICD-10-CM | POA: Diagnosis not present

## 2016-12-06 DIAGNOSIS — D225 Melanocytic nevi of trunk: Secondary | ICD-10-CM | POA: Diagnosis not present

## 2016-12-06 DIAGNOSIS — D485 Neoplasm of uncertain behavior of skin: Secondary | ICD-10-CM | POA: Diagnosis not present

## 2016-12-06 DIAGNOSIS — L821 Other seborrheic keratosis: Secondary | ICD-10-CM | POA: Diagnosis not present

## 2016-12-06 DIAGNOSIS — D1801 Hemangioma of skin and subcutaneous tissue: Secondary | ICD-10-CM | POA: Diagnosis not present

## 2016-12-06 DIAGNOSIS — D2262 Melanocytic nevi of left upper limb, including shoulder: Secondary | ICD-10-CM | POA: Diagnosis not present

## 2016-12-06 DIAGNOSIS — L814 Other melanin hyperpigmentation: Secondary | ICD-10-CM | POA: Diagnosis not present

## 2016-12-21 DIAGNOSIS — K080 Exfoliation of teeth due to systemic causes: Secondary | ICD-10-CM | POA: Diagnosis not present

## 2016-12-23 ENCOUNTER — Other Ambulatory Visit: Payer: Self-pay | Admitting: Internal Medicine

## 2016-12-23 DIAGNOSIS — K219 Gastro-esophageal reflux disease without esophagitis: Secondary | ICD-10-CM

## 2016-12-30 ENCOUNTER — Other Ambulatory Visit: Payer: Self-pay | Admitting: Internal Medicine

## 2016-12-30 DIAGNOSIS — G4700 Insomnia, unspecified: Secondary | ICD-10-CM

## 2017-01-06 NOTE — H&P (Signed)
Anne Mcguire is an 46 y.o. female with heavy menstrual bleeding despite Mirena IUD placed just under 1 year ago.  The patient is hypertensive and is therefore unable to take estrogen containing therapeutic options.  Additionally, the patient desires sterility as she has completed child-bearing.  She declines other progesterone only options, antifibrinolytics, and hysterectomy.  Pertinent Gynecological History: Menses: flow is excessive with use of 5 pads or tampons on heaviest days Bleeding: dysfunctional uterine bleeding Contraception: IUD DES exposure: unknown Blood transfusions: none Sexually transmitted diseases: no past history Previous GYN Procedures: none  Last mammogram: normal Date: 01/2016 Last pap: normal Date: 01/2106 OB History: G0   Menstrual History: Menarche age: n/a No LMP recorded.    Past Medical History:  Diagnosis Date  . Anxiety   . Chicken pox   . Depression   . GERD (gastroesophageal reflux disease)   . History of stomach ulcers   . Hyperlipidemia   . Hypertension   . Migraines   . Thyroid disease     No past surgical history on file.  Family History  Problem Relation Age of Onset  . Mental illness Mother   . Arthritis Father   . Hyperlipidemia Father   . Hypertension Father   . Diabetes Father   . Arthritis Maternal Grandmother   . Diabetes Maternal Grandmother   . Arthritis Maternal Grandfather   . Diabetes Maternal Grandfather   . Arthritis Paternal Grandmother   . Breast cancer Paternal Grandmother     Social History:  reports that she has never smoked. She has never used smokeless tobacco. She reports that she drinks alcohol. She reports that she does not use drugs.  Allergies:  Allergies  Allergen Reactions  . Azithromycin Hives    No prescriptions prior to admission.    ROS  There were no vitals taken for this visit. Physical Exam  Constitutional: She is oriented to person, place, and time. She appears well-developed  and well-nourished.  Cardiovascular: Normal rate and regular rhythm.   Respiratory: Effort normal and breath sounds normal.  GI: Soft. There is no rebound and no guarding.  Neurological: She is alert and oriented to person, place, and time.  Skin: Skin is warm and dry.  Psychiatric: She has a normal mood and affect. Her behavior is normal.    No results found for this or any previous visit (from the past 24 hour(s)).  No results found.  Assessment/Plan: HMB, desire for permanent sterility L/S BTL, H/S D&C, NovaSure, IUD removal Patient is counseled re: risk of bleeding, infection, scarring, and damage to surrounding structures.  She also understands the risk of permanence, regret, and ectopic with BTL.  She also understands the risk of pain following ablation and BTL as well as sealing the uterus potentially obscuring endometrial CA. She has been counseled that up to 50% of ablation patients will go on to require hysterectomy.  All questions were answered and the patient wishes to proceed.  Paulanthony Gleaves 01/06/2017, 8:50 PM

## 2017-01-07 ENCOUNTER — Encounter (HOSPITAL_BASED_OUTPATIENT_CLINIC_OR_DEPARTMENT_OTHER): Payer: Self-pay | Admitting: *Deleted

## 2017-01-07 DIAGNOSIS — N920 Excessive and frequent menstruation with regular cycle: Secondary | ICD-10-CM | POA: Diagnosis not present

## 2017-01-07 DIAGNOSIS — Z302 Encounter for sterilization: Secondary | ICD-10-CM | POA: Diagnosis not present

## 2017-01-07 NOTE — Progress Notes (Signed)
NPO AFTER MN.  ARRIVE AT 0600.  NEEDS HG AND URINE PREG.  WILL TAKE AM MEDS W/ SIPS OF WATER DOS.

## 2017-01-10 ENCOUNTER — Ambulatory Visit (HOSPITAL_BASED_OUTPATIENT_CLINIC_OR_DEPARTMENT_OTHER)
Admission: RE | Admit: 2017-01-10 | Discharge: 2017-01-10 | Disposition: A | Discharge status: Home / Self Care | Payer: Federal, State, Local not specified - PPO | Source: Ambulatory Visit | Attending: Obstetrics & Gynecology | Admitting: Obstetrics & Gynecology

## 2017-01-10 ENCOUNTER — Encounter (HOSPITAL_BASED_OUTPATIENT_CLINIC_OR_DEPARTMENT_OTHER): Payer: Self-pay

## 2017-01-10 ENCOUNTER — Ambulatory Visit (HOSPITAL_BASED_OUTPATIENT_CLINIC_OR_DEPARTMENT_OTHER): Payer: Federal, State, Local not specified - PPO | Admitting: Anesthesiology

## 2017-01-10 ENCOUNTER — Other Ambulatory Visit: Payer: Self-pay | Admitting: Internal Medicine

## 2017-01-10 ENCOUNTER — Encounter (HOSPITAL_BASED_OUTPATIENT_CLINIC_OR_DEPARTMENT_OTHER)
Admission: RE | Disposition: A | Discharge status: Home / Self Care | Payer: Self-pay | Source: Ambulatory Visit | Attending: Obstetrics & Gynecology

## 2017-01-10 DIAGNOSIS — F329 Major depressive disorder, single episode, unspecified: Secondary | ICD-10-CM | POA: Insufficient documentation

## 2017-01-10 DIAGNOSIS — F419 Anxiety disorder, unspecified: Secondary | ICD-10-CM | POA: Diagnosis not present

## 2017-01-10 DIAGNOSIS — Z79899 Other long term (current) drug therapy: Secondary | ICD-10-CM | POA: Diagnosis not present

## 2017-01-10 DIAGNOSIS — Z302 Encounter for sterilization: Secondary | ICD-10-CM | POA: Diagnosis not present

## 2017-01-10 DIAGNOSIS — N926 Irregular menstruation, unspecified: Secondary | ICD-10-CM | POA: Diagnosis not present

## 2017-01-10 DIAGNOSIS — E079 Disorder of thyroid, unspecified: Secondary | ICD-10-CM | POA: Diagnosis not present

## 2017-01-10 DIAGNOSIS — I10 Essential (primary) hypertension: Secondary | ICD-10-CM | POA: Insufficient documentation

## 2017-01-10 DIAGNOSIS — E039 Hypothyroidism, unspecified: Secondary | ICD-10-CM | POA: Diagnosis not present

## 2017-01-10 DIAGNOSIS — F411 Generalized anxiety disorder: Secondary | ICD-10-CM

## 2017-01-10 DIAGNOSIS — Z8719 Personal history of other diseases of the digestive system: Secondary | ICD-10-CM | POA: Diagnosis not present

## 2017-01-10 DIAGNOSIS — N920 Excessive and frequent menstruation with regular cycle: Secondary | ICD-10-CM | POA: Diagnosis not present

## 2017-01-10 DIAGNOSIS — Z30432 Encounter for removal of intrauterine contraceptive device: Secondary | ICD-10-CM | POA: Diagnosis not present

## 2017-01-10 DIAGNOSIS — Z8742 Personal history of other diseases of the female genital tract: Secondary | ICD-10-CM

## 2017-01-10 DIAGNOSIS — K219 Gastro-esophageal reflux disease without esophagitis: Secondary | ICD-10-CM | POA: Insufficient documentation

## 2017-01-10 DIAGNOSIS — E785 Hyperlipidemia, unspecified: Secondary | ICD-10-CM | POA: Insufficient documentation

## 2017-01-10 HISTORY — DX: Nocturia: R35.1

## 2017-01-10 HISTORY — DX: Excessive and frequent menstruation with regular cycle: N92.0

## 2017-01-10 HISTORY — DX: Presence of spectacles and contact lenses: Z97.3

## 2017-01-10 HISTORY — DX: Hypothyroidism, unspecified: E03.9

## 2017-01-10 HISTORY — DX: Elevated blood-pressure reading, without diagnosis of hypertension: R03.0

## 2017-01-10 HISTORY — PX: LAPAROSCOPIC TUBAL LIGATION: SHX1937

## 2017-01-10 HISTORY — PX: DILITATION & CURRETTAGE/HYSTROSCOPY WITH NOVASURE ABLATION: SHX5568

## 2017-01-10 HISTORY — DX: Personal history of peptic ulcer disease: Z87.11

## 2017-01-10 LAB — POCT PREGNANCY, URINE: Preg Test, Ur: NEGATIVE

## 2017-01-10 SURGERY — DILATATION & CURETTAGE/HYSTEROSCOPY WITH NOVASURE ABLATION
Anesthesia: General

## 2017-01-10 MED ORDER — KETOROLAC TROMETHAMINE 30 MG/ML IJ SOLN
INTRAMUSCULAR | Status: AC
Start: 2017-01-10 — End: 2017-01-10
  Filled 2017-01-10: qty 1

## 2017-01-10 MED ORDER — ONDANSETRON HCL 4 MG/2ML IJ SOLN
INTRAMUSCULAR | Status: DC | PRN
  Administered 2017-01-10: 4 mg via INTRAVENOUS

## 2017-01-10 MED ORDER — FENTANYL CITRATE (PF) 100 MCG/2ML IJ SOLN
INTRAMUSCULAR | Status: AC
  Filled 2017-01-10: qty 2

## 2017-01-10 MED ORDER — SUGAMMADEX SODIUM 200 MG/2ML IV SOLN
INTRAVENOUS | Status: AC
Start: 2017-01-10 — End: 2017-01-10
  Filled 2017-01-10: qty 2

## 2017-01-10 MED ORDER — SUCCINYLCHOLINE CHLORIDE 200 MG/10ML IV SOSY
PREFILLED_SYRINGE | INTRAVENOUS | Status: AC
  Filled 2017-01-10: qty 10

## 2017-01-10 MED ORDER — LACTATED RINGERS IV SOLN
INTRAVENOUS | Status: DC
  Filled 2017-01-10: qty 1000

## 2017-01-10 MED ORDER — METOCLOPRAMIDE HCL 5 MG/ML IJ SOLN
INTRAMUSCULAR | Status: DC | PRN
  Administered 2017-01-10: 10 mg via INTRAVENOUS

## 2017-01-10 MED ORDER — DEXAMETHASONE SODIUM PHOSPHATE 4 MG/ML IJ SOLN
INTRAMUSCULAR | Status: DC | PRN
  Administered 2017-01-10: 10 mg via INTRAVENOUS

## 2017-01-10 MED ORDER — ONDANSETRON HCL 4 MG/2ML IJ SOLN
INTRAMUSCULAR | Status: AC
  Filled 2017-01-10: qty 2

## 2017-01-10 MED ORDER — SUGAMMADEX SODIUM 200 MG/2ML IV SOLN
INTRAVENOUS | Status: DC | PRN
  Administered 2017-01-10: 200 mg via INTRAVENOUS

## 2017-01-10 MED ORDER — SODIUM CHLORIDE 0.9 % IR SOLN
Status: DC | PRN
  Administered 2017-01-10: 3000 mL

## 2017-01-10 MED ORDER — MEPERIDINE HCL 25 MG/ML IJ SOLN
6.2500 mg | INTRAMUSCULAR | Status: DC | PRN
  Filled 2017-01-10: qty 1

## 2017-01-10 MED ORDER — KETOROLAC TROMETHAMINE 30 MG/ML IJ SOLN
INTRAMUSCULAR | Status: DC | PRN
  Administered 2017-01-10: 30 mg via INTRAVENOUS

## 2017-01-10 MED ORDER — OXYCODONE-ACETAMINOPHEN 5-325 MG PO TABS: 1.0000 | ORAL_TABLET | ORAL | 0 refills | Status: DC | PRN

## 2017-01-10 MED ORDER — SODIUM CHLORIDE 0.9 % IJ SOLN
INTRAMUSCULAR | Status: DC | PRN
  Administered 2017-01-10: 10 mL via INTRAVENOUS

## 2017-01-10 MED ORDER — BUPIVACAINE HCL (PF) 0.25 % IJ SOLN
INTRAMUSCULAR | Status: AC
  Filled 2017-01-10: qty 30

## 2017-01-10 MED ORDER — PROPOFOL 10 MG/ML IV BOLUS
INTRAVENOUS | Status: AC
  Filled 2017-01-10: qty 40

## 2017-01-10 MED ORDER — BUPIVACAINE HCL (PF) 0.25 % IJ SOLN
INTRAMUSCULAR | Status: DC | PRN
  Administered 2017-01-10: 2 mL

## 2017-01-10 MED ORDER — ROCURONIUM BROMIDE 50 MG/5ML IV SOSY
PREFILLED_SYRINGE | INTRAVENOUS | Status: DC | PRN
  Administered 2017-01-10: 40 mg via INTRAVENOUS

## 2017-01-10 MED ORDER — METOCLOPRAMIDE HCL 5 MG/ML IJ SOLN
10.0000 mg | Freq: Once | INTRAMUSCULAR | Status: DC | PRN
  Filled 2017-01-10: qty 2

## 2017-01-10 MED ORDER — OXYCODONE-ACETAMINOPHEN 5-325 MG PO TABS
ORAL_TABLET | ORAL | Status: AC
  Filled 2017-01-10: qty 1

## 2017-01-10 MED ORDER — IBUPROFEN 800 MG PO TABS: 800.0000 mg | ORAL_TABLET | Freq: Four times a day (QID) | ORAL | 0 refills | Status: DC | PRN

## 2017-01-10 MED ORDER — LACTATED RINGERS IV SOLN
INTRAVENOUS | Status: DC
  Administered 2017-01-10 (×2): via INTRAVENOUS
  Filled 2017-01-10: qty 1000

## 2017-01-10 MED ORDER — PROPOFOL 10 MG/ML IV BOLUS
INTRAVENOUS | Status: DC | PRN
  Administered 2017-01-10: 200 mg via INTRAVENOUS

## 2017-01-10 MED ORDER — LIDOCAINE 2% (20 MG/ML) 5 ML SYRINGE
INTRAMUSCULAR | Status: AC
  Filled 2017-01-10: qty 5

## 2017-01-10 MED ORDER — LIDOCAINE HCL (CARDIAC) 20 MG/ML IV SOLN
INTRAVENOUS | Status: DC | PRN
  Administered 2017-01-10: 100 mg via INTRAVENOUS

## 2017-01-10 MED ORDER — OXYCODONE-ACETAMINOPHEN 5-325 MG PO TABS
1.0000 | ORAL_TABLET | Freq: Once | ORAL | Status: AC
  Administered 2017-01-10: 1 via ORAL
  Filled 2017-01-10: qty 1

## 2017-01-10 MED ORDER — ARTIFICIAL TEARS OP OINT
TOPICAL_OINTMENT | OPHTHALMIC | Status: AC
  Filled 2017-01-10: qty 3.5

## 2017-01-10 MED ORDER — ROCURONIUM BROMIDE 50 MG/5ML IV SOSY
PREFILLED_SYRINGE | INTRAVENOUS | Status: AC
  Filled 2017-01-10: qty 5

## 2017-01-10 MED ORDER — FENTANYL CITRATE (PF) 100 MCG/2ML IJ SOLN
INTRAMUSCULAR | Status: DC | PRN
  Administered 2017-01-10 (×4): 50 ug via INTRAVENOUS

## 2017-01-10 MED ORDER — METOCLOPRAMIDE HCL 5 MG/ML IJ SOLN
INTRAMUSCULAR | Status: AC
  Filled 2017-01-10: qty 2

## 2017-01-10 MED ORDER — DEXAMETHASONE SODIUM PHOSPHATE 10 MG/ML IJ SOLN
INTRAMUSCULAR | Status: AC
  Filled 2017-01-10: qty 1

## 2017-01-10 MED ORDER — FENTANYL CITRATE (PF) 100 MCG/2ML IJ SOLN
25.0000 ug | INTRAMUSCULAR | Status: DC | PRN
  Administered 2017-01-10: 25 ug via INTRAVENOUS
  Administered 2017-01-10: 50 ug via INTRAVENOUS
  Filled 2017-01-10: qty 1

## 2017-01-10 MED ORDER — MIDAZOLAM HCL 5 MG/5ML IJ SOLN
INTRAMUSCULAR | Status: DC | PRN
  Administered 2017-01-10: 2 mg via INTRAVENOUS

## 2017-01-10 MED ORDER — SODIUM CHLORIDE 0.9 % IJ SOLN
INTRAMUSCULAR | Status: AC
  Filled 2017-01-10: qty 50

## 2017-01-10 MED ORDER — LIDOCAINE HCL 1 % IJ SOLN
INTRAMUSCULAR | Status: AC
  Filled 2017-01-10: qty 20

## 2017-01-10 MED ORDER — MIDAZOLAM HCL 2 MG/2ML IJ SOLN
INTRAMUSCULAR | Status: AC
  Filled 2017-01-10: qty 2

## 2017-01-10 MED ORDER — LIDOCAINE HCL 4 % MT SOLN
OROMUCOSAL | Status: DC | PRN
  Administered 2017-01-10: 3 mL via TOPICAL

## 2017-01-10 SURGICAL SUPPLY — 69 items
ABLATOR ENDOMETRIAL BIPOLAR (ABLATOR) ×3 IMPLANT
BENZOIN TINCTURE PRP APPL 2/3 (GAUZE/BANDAGES/DRESSINGS) IMPLANT
BIPOLAR CUTTING LOOP 21FR (ELECTRODE)
BLADE SURG 11 STRL SS (BLADE) ×3 IMPLANT
CANISTER SUCTION 2500CC (MISCELLANEOUS) ×3 IMPLANT
CATH ROBINSON RED A/P 16FR (CATHETERS) ×3 IMPLANT
CHLORAPREP W/TINT 26ML (MISCELLANEOUS) ×3 IMPLANT
CLIP FILSHIE TUBAL LIGA STRL (Clip) ×3 IMPLANT
COVER BACK TABLE 60X90IN (DRAPES) ×6 IMPLANT
COVER MAYO STAND STRL (DRAPES) ×3 IMPLANT
DERMABOND ADVANCED (GAUZE/BANDAGES/DRESSINGS)
DERMABOND ADVANCED .7 DNX12 (GAUZE/BANDAGES/DRESSINGS) IMPLANT
DILATOR CANAL MILEX (MISCELLANEOUS) IMPLANT
DRAPE LG THREE QUARTER DISP (DRAPES) ×3 IMPLANT
DRAPE UNDERBUTTOCKS STRL (DRAPE) ×3 IMPLANT
DRSG OPSITE POSTOP 3X4 (GAUZE/BANDAGES/DRESSINGS) ×3 IMPLANT
DRSG TEGADERM 4X4.75 (GAUZE/BANDAGES/DRESSINGS) ×3 IMPLANT
DRSG TELFA 3X8 NADH (GAUZE/BANDAGES/DRESSINGS) ×3 IMPLANT
ELECT REM PT RETURN 9FT ADLT (ELECTROSURGICAL) ×3
ELECTRODE REM PT RTRN 9FT ADLT (ELECTROSURGICAL) ×2 IMPLANT
GAUZE SPONGE 4X4 16PLY XRAY LF (GAUZE/BANDAGES/DRESSINGS) ×3 IMPLANT
GAUZE VASELINE 1X8 (GAUZE/BANDAGES/DRESSINGS) IMPLANT
GLOVE BIO SURGEON STRL SZ 6 (GLOVE) ×6 IMPLANT
GLOVE BIO SURGEON STRL SZ 6.5 (GLOVE) ×3 IMPLANT
GLOVE BIOGEL PI IND STRL 6 (GLOVE) ×4 IMPLANT
GLOVE BIOGEL PI IND STRL 6.5 (GLOVE) ×4 IMPLANT
GLOVE BIOGEL PI INDICATOR 6 (GLOVE) ×2
GLOVE BIOGEL PI INDICATOR 6.5 (GLOVE) ×2
GLOVE SURG SS PI 6.0 STRL IVOR (GLOVE) ×6 IMPLANT
GOWN STRL REUS W/ TWL LRG LVL3 (GOWN DISPOSABLE) ×6 IMPLANT
GOWN STRL REUS W/TWL LRG LVL3 (GOWN DISPOSABLE) ×3
KIT ROOM TURNOVER WOR (KITS) ×3 IMPLANT
LEGGING LITHOTOMY PAIR STRL (DRAPES) ×3 IMPLANT
LOOP CUTTING BIPOLAR 21FR (ELECTRODE) IMPLANT
NEEDLE HYPO 25X1 1.5 SAFETY (NEEDLE) ×3 IMPLANT
NEEDLE INSUFFLATION 120MM (ENDOMECHANICALS) ×3 IMPLANT
NEEDLE SPNL 22GX3.5 QUINCKE BK (NEEDLE) IMPLANT
NS IRRIG 500ML POUR BTL (IV SOLUTION) ×3 IMPLANT
PACK BASIN DAY SURGERY FS (CUSTOM PROCEDURE TRAY) ×3 IMPLANT
PACK LAPAROSCOPY II (CUSTOM PROCEDURE TRAY) ×3 IMPLANT
PAD DRESSING TELFA 3X8 NADH (GAUZE/BANDAGES/DRESSINGS) ×2 IMPLANT
PAD OB MATERNITY 4.3X12.25 (PERSONAL CARE ITEMS) ×3 IMPLANT
PADDING ION DISPOSABLE (MISCELLANEOUS) ×3 IMPLANT
PENCIL BUTTON HOLSTER BLD 10FT (ELECTRODE) IMPLANT
POUCH SPECIMEN RETRIEVAL 10MM (ENDOMECHANICALS) IMPLANT
SCISSORS LAP 5X35 DISP (ENDOMECHANICALS) IMPLANT
SEALER TISSUE G2 CVD JAW 35 (ENDOMECHANICALS) IMPLANT
SEALER TISSUE G2 CVD JAW 45CM (ENDOMECHANICALS) IMPLANT
SET IRRIG TUBING LAPAROSCOPIC (IRRIGATION / IRRIGATOR) IMPLANT
SOLUTION ANTI FOG 6CC (MISCELLANEOUS) ×3 IMPLANT
SPONGE GAUZE 2X2 8PLY STRL LF (GAUZE/BANDAGES/DRESSINGS) IMPLANT
SPONGE LAP 4X18 X RAY DECT (DISPOSABLE) IMPLANT
STRIP CLOSURE SKIN 1/2X4 (GAUZE/BANDAGES/DRESSINGS) IMPLANT
STRIP CLOSURE SKIN 1/4X4 (GAUZE/BANDAGES/DRESSINGS) IMPLANT
SUT MNCRL AB 3-0 PS2 18 (SUTURE) ×3 IMPLANT
SUT VICRYL 0 UR6 27IN ABS (SUTURE) ×3 IMPLANT
SYR CONTROL 10ML LL (SYRINGE) ×3 IMPLANT
TOWEL OR 17X24 6PK STRL BLUE (TOWEL DISPOSABLE) ×6 IMPLANT
TRAY DSU PREP LF (CUSTOM PROCEDURE TRAY) ×3 IMPLANT
TROCAR BLADELESS OPT 5 100 (ENDOMECHANICALS) ×3 IMPLANT
TROCAR DILATING TIP 12MM 150MM (ENDOMECHANICALS) ×3 IMPLANT
TROCAR XCEL NON-BLD 11X100MML (ENDOMECHANICALS) ×3 IMPLANT
TROCAR XCEL NON-BLD 5MMX100MML (ENDOMECHANICALS) IMPLANT
TUBE CONNECTING 12X1/4 (SUCTIONS) IMPLANT
TUBING AQUILEX INFLOW (TUBING) ×3 IMPLANT
TUBING AQUILEX OUTFLOW (TUBING) ×3 IMPLANT
TUBING INSUFFLATION 10FT LAP (TUBING) ×3 IMPLANT
WARMER LAPAROSCOPE (MISCELLANEOUS) ×3 IMPLANT
WATER STERILE IRR 500ML POUR (IV SOLUTION) ×3 IMPLANT

## 2017-01-10 NOTE — Anesthesia Procedure Notes (Signed)
Procedure Name: Intubation Date/Time: 01/10/2017 7:38 AM Performed by: Montez Hageman Pre-anesthesia Checklist: Patient identified, Emergency Drugs available, Suction available and Patient being monitored Patient Re-evaluated:Patient Re-evaluated prior to inductionOxygen Delivery Method: Circle system utilized Preoxygenation: Pre-oxygenation with 100% oxygen Intubation Type: IV induction Ventilation: Mask ventilation without difficulty Laryngoscope Size: Mac and 3 Grade View: Grade II Tube type: Oral Tube size: 7.0 mm Number of attempts: 1 Airway Equipment and Method: Stylet and LTA kit utilized Placement Confirmation: ETT inserted through vocal cords under direct vision,  positive ETCO2 and breath sounds checked- equal and bilateral Secured at: 21 cm Tube secured with: Tape Dental Injury: Teeth and Oropharynx as per pre-operative assessment

## 2017-01-10 NOTE — Progress Notes (Signed)
No change to H&P. Will remove IUD under anesthesia prior to case.  This was added to consent and initialed by patient.   [name], DO

## 2017-01-10 NOTE — Anesthesia Postprocedure Evaluation (Signed)
Anesthesia Post Note  Patient: Anne Mcguire  Procedure(s) Performed: Procedure(s) (LRB): DILATATION & CURETTAGE/HYSTEROSCOPY WITH NOVASURE ABLATION (N/A) LAPAROSCOPIC TUBAL LIGATION with Filshie Clips (Bilateral)  Patient location during evaluation: PACU Anesthesia Type: General Level of consciousness: awake and alert Pain management: pain level controlled Vital Signs Assessment: post-procedure vital signs reviewed and stable Respiratory status: spontaneous breathing, nonlabored ventilation, respiratory function stable and patient connected to nasal cannula oxygen Cardiovascular status: blood pressure returned to baseline and stable Postop Assessment: no signs of nausea or vomiting Anesthetic complications: no       Last Vitals:  Vitals:   01/10/17 0850 01/10/17 0900  BP:    Pulse: 84   Resp: 14 14  Temp: 36.8 C     Last Pain:  Vitals:   01/10/17 0945  TempSrc:   PainSc: 8                  Montez Hageman

## 2017-01-10 NOTE — Discharge Instructions (Signed)
Post Anesthesia Home Care Instructions  Activity: Get plenty of rest for the remainder of the day. A responsible adult should stay with you for 24 hours following the procedure.  For the next 24 hours, DO NOT: -Drive a car -Paediatric nurse -Drink alcoholic beverages -Take any medication unless instructed by your physician -Make any legal decisions or sign important papers.  Meals: Start with liquid foods such as gelatin or soup. Progress to regular foods as tolerated. Avoid greasy, spicy, heavy foods. If nausea and/or vomiting occur, drink only clear liquids until the nausea and/or vomiting subsides. Call your physician if vomiting continues.  Special Instructions/Symptoms: Your throat may feel dry or sore from the anesthesia or the breathing tube placed in your throat during surgery. If this causes discomfort, gargle with warm salt water. The discomfort should disappear within 24 hours.  If you had a scopolamine patch placed behind your ear for the management of post- operative nausea and/or vomiting:  1. The medication in the patch is effective for 72 hours, after which it should be removed.  Wrap patch in a tissue and discard in the trash. Wash hands thoroughly with soap and water. 2. You may remove the patch earlier than 72 hours if you experience unpleasant side effects which may include dry mouth, dizziness or visual disturbances. 3. Avoid touching the patch. Wash your hands with soap and water after contact with the patch.     Call MD for T>100.4, heavy vaginal bleeding, severe abdominal pain, intractable nausea and/or vomiting, or respiratory distress. Call office to schedule postop appointment in 2 weeks. No driving while taking narcotics.  Pelvic rest x 2 weeks.  DISCHARGE INSTRUCTIONS: Laparoscopy  The following instructions have been prepared to help you care for yourself upon your return home today.  Wound care:  Do not get the incision wet for the first 24 hours.  The incision should be kept clean and dry.  The Band-Aids or dressings may be removed the day after surgery.  Should the incision become sore, red, and swollen after the first week, check with your doctor.  Personal hygiene:  Shower the day after your procedure.  Activity and limitations:  Do NOT drive or operate any equipment today.  Do NOT lift anything more than 15 pounds for 2-3 weeks after surgery.  Do NOT rest in bed all day.  Walking is encouraged. Walk each day, starting slowly with 5-minute walks 3 or 4 times a day. Slowly increase the length of your walks.  Walk up and down stairs slowly.  Do NOT do strenuous activities, such as golfing, playing tennis, bowling, running, biking, weight lifting, gardening, mowing, or vacuuming for 2-4 weeks. Ask your doctor when it is okay to start.  Diet: Eat a light meal as desired this evening. You may resume your usual diet tomorrow.  Return to work: This is dependent on the type of work you do. For the most part you can return to a desk job within a week of surgery. If you are more active at work, please discuss this with your doctor.  What to expect after your surgery: You may have a slight burning sensation when you urinate on the first day. You may have a very small amount of blood in the urine. Expect to have a small amount of vaginal discharge/light bleeding for 1-2 weeks. It is not unusual to have abdominal soreness and bruising for up to 2 weeks. You may be tired and need more rest for about 1 week.  You may experience shoulder pain for 24-72 hours. Lying flat in bed may relieve it.  Call your doctor for any of the following:  Develop a fever of 100.4 or greater  Inability to urinate 6 hours after discharge from hospital  Severe pain not relieved by pain medications  Persistent of heavy bleeding at incision site  Redness or swelling around incision site after a week  Increasing nausea or vomiting  Patient  Signature________________________________________ Nurse Signature_________________________________________

## 2017-01-10 NOTE — Op Note (Signed)
VIRGIN GREISEN 01/10/2017  PREOPERATIVE DIAGNOSIS:  Heavy menstrual bleeding, desire for permanent sterility  POSTOPERATIVE DIAGNOSIS:  SAA  PROCEDURE:  Laparoscopic Bilateral Tubal Ligation, Hysteroscopy, D&C, NovaSure, IUD removal  ANESTHESIA:  General endotracheal  COMPLICATIONS:  None immediate.  ESTIMATED BLOOD LOSS:  20 ml.  PATHOLOGY: Endometrial curettings  INDICATIONS: 46 y.o. with history of heavy menstrual bleeding failing medical management.  She additionally desires permanent sterility.    FINDINGS:  Normal uterus, normal bilateral fallopian tubes.  Endometrial cavity with diffuse fluffy endometrium.    TECHNIQUE:  The patient was taken to the operating room where general anesthesia was obtained without difficulty.  She was then placed in the dorsal lithotomy position and prepared and draped in sterile fashion.  After an adequate timeout was performed, a bivalved speculum was then placed in the patient's vagina and Mirena IUD was easily removed. The anterior lip of cervix was grasped with the single-tooth tenaculum and the hulka clip was advanced into the uterus.  The speculum was removed from the vagina.  Attention was then turned to the patient's abdomen where a 11-mm skin incision was made on the umbilical fold.  The Veress needle was carefully introduced into the peritoneal cavity through the abdominal wall.  Intraperitoneal placement was confirmed by drop in intraabdominal pressure with insufflation of carbon dioxide gas.  Adequate pneumoperitoneum was obtained, and the 10/11 XL trocar was then advanced without difficulty into the abdomen where intraabdominal placement was confirmed by the operative laparoscope.  Normal gynecologic anatomy was noted.  The Filshie clip was advanced and easily placed across the right fallopian tube approximately 3 cm from the cornua.  The left fallopian tube was treated in the same fashion.  All instruments were removed from the abdomen.  The  fascia was closed with 0 Vicryl in a figure of eight stitch.  The skin was closed with 3-0 monocryl in a subcuticular fashion.    Attention was then turned to the vaginal portion of the case. The uteus was sounded to 9.5 cm and dilated manually with metal dilators to accommodate the 5.5 mm diagnostic hysteroscope.  Once the cervix was dilated, the hysteroscope was inserted under direct visualization using saline as a suspension medium.  The uterine cavity was carefully examined with diffusely thickened endometrium noted.   After further careful visualization of the uterine cavity, the hysteroscope was removed under direct visualization; noting cervical length on removal of 3.5 cm.  A sharp curettage was then performed to obtain a moderate amount of endometrial curettings.  The NovaSure was then opened, calibrated for 6 cm cavity length, and seated.  After seating, the cavity width was found to be 2.6 cm.  The test cycle and then treatment was performed.  Treatment power 86 W and time 120s.  The NovaSure was removed.  The tenaculum was removed from the anterior lip of the cervix and the vaginal speculum was removed after noting good hemostasis.  The patient tolerated the procedure well and was taken to the recovery area awake, extubated and in stable condition. Sponge, lap, and needle counts were correct times two.

## 2017-01-10 NOTE — Anesthesia Preprocedure Evaluation (Signed)
Anesthesia Evaluation  Patient identified by MRN, date of birth, ID band Patient awake    Reviewed: Allergy & Precautions, NPO status , Patient's Chart, lab work & pertinent test results  Airway Mallampati: II  TM Distance: >3 FB Neck ROM: Full    Dental no notable dental hx.    Pulmonary neg pulmonary ROS,    Pulmonary exam normal breath sounds clear to auscultation       Cardiovascular hypertension, Normal cardiovascular exam Rhythm:Regular Rate:Normal     Neuro/Psych negative neurological ROS  negative psych ROS   GI/Hepatic Neg liver ROS, GERD  Medicated and Controlled,  Endo/Other  Hypothyroidism   Renal/GU negative Renal ROS  negative genitourinary   Musculoskeletal negative musculoskeletal ROS (+)   Abdominal   Peds negative pediatric ROS (+)  Hematology negative hematology ROS (+)   Anesthesia Other Findings   Reproductive/Obstetrics negative OB ROS                             Anesthesia Physical Anesthesia Plan  ASA: II  Anesthesia Plan: General   Post-op Pain Management:    Induction: Intravenous  Airway Management Planned: Oral ETT  Additional Equipment:   Intra-op Plan:   Post-operative Plan: Extubation in OR  Informed Consent: I have reviewed the patients History and Physical, chart, labs and discussed the procedure including the risks, benefits and alternatives for the proposed anesthesia with the patient or authorized representative who has indicated his/her understanding and acceptance.   Dental advisory given  Plan Discussed with: CRNA  Anesthesia Plan Comments:         Anesthesia Quick Evaluation

## 2017-01-10 NOTE — Transfer of Care (Signed)
  Last Vitals:  Vitals:   01/10/17 0612 01/10/17 0850  BP: (!) 160/103   Pulse: 89 84  Resp: 18   Temp: 36.8 C 36.8 C    Last Pain:  Vitals:   01/10/17 0612  TempSrc: Oral      Patients Stated Pain Goal: 8 (01/10/17 [id number])  Immediate Anesthesia Transfer of Care Note  Patient: Anne Mcguire  Procedure(s) Performed: Procedure(s) (LRB): DILATATION & CURETTAGE/HYSTEROSCOPY WITH NOVASURE ABLATION (N/A) LAPAROSCOPIC TUBAL LIGATION with Filshie Clips (Bilateral)  Patient Location: PACU  Anesthesia Type: General  Level of Consciousness: awake, alert  and oriented  Airway & Oxygen Therapy: Patient Spontanous Breathing and Patient connected to nasal cannula oxygen  Post-op Assessment: Report given to PACU RN and Post -op Vital signs reviewed and stable  Post vital signs: Reviewed and stable  Complications: No apparent anesthesia complications

## 2017-01-11 ENCOUNTER — Encounter (HOSPITAL_BASED_OUTPATIENT_CLINIC_OR_DEPARTMENT_OTHER): Payer: Self-pay | Admitting: Obstetrics & Gynecology

## 2017-01-23 DIAGNOSIS — K080 Exfoliation of teeth due to systemic causes: Secondary | ICD-10-CM | POA: Diagnosis not present

## 2017-01-28 ENCOUNTER — Encounter: Payer: Self-pay | Admitting: Internal Medicine

## 2017-01-28 ENCOUNTER — Ambulatory Visit (INDEPENDENT_AMBULATORY_CARE_PROVIDER_SITE_OTHER): Payer: Federal, State, Local not specified - PPO | Admitting: Internal Medicine

## 2017-01-28 VITALS — BP 136/98 | HR 82 | Temp 98.1°F | Wt 220.5 lb

## 2017-01-28 DIAGNOSIS — I10 Essential (primary) hypertension: Secondary | ICD-10-CM | POA: Diagnosis not present

## 2017-01-28 MED ORDER — LISINOPRIL 10 MG PO TABS
10.0000 mg | ORAL_TABLET | Freq: Every day | ORAL | 0 refills | Status: DC
Start: 2017-01-28 — End: 2017-02-18

## 2017-01-28 NOTE — Assessment & Plan Note (Signed)
Uncontrolled Discussed goal of 120/80 eRx for Lisinopril 10 mg daily Encouraged her to work on weight loss  RTC in 3 weeks for BP check

## 2017-01-28 NOTE — Addendum Note (Signed)
Addended by: Lurlean Nanny on: 01/28/2017 09:26 AM   Modules accepted: Orders

## 2017-01-28 NOTE — Patient Instructions (Signed)
Hypertension Hypertension is another name for high blood pressure. High blood pressure forces your heart to work harder to pump blood. A blood pressure reading has two numbers, which includes a higher number over a lower number (example: 110/72). Follow these instructions at home:  Have your blood pressure rechecked by your doctor.  Only take medicine as told by your doctor. Follow the directions carefully. The medicine does not work as well if you skip doses. Skipping doses also puts you at risk for problems.  Do not smoke.  Monitor your blood pressure at home as told by your doctor. Contact a doctor if:  You think you are having a reaction to the medicine you are taking.  You have repeat headaches or feel dizzy.  You have puffiness (swelling) in your ankles.  You have trouble with your vision. Get help right away if:  You get a very bad headache and are confused.  You feel weak, numb, or faint.  You get chest or belly (abdominal) pain.  You throw up (vomit).  You cannot breathe very well. This information is not intended to replace advice given to you by your health care provider. Make sure you discuss any questions you have with your health care provider. Document Released: 05/14/2008 Document Revised: 05/03/2016 Document Reviewed: 09/18/2013 Elsevier Interactive Patient Education  2017 [employer]  

## 2017-01-28 NOTE — Progress Notes (Signed)
Subjective:    Patient ID: Anne Mcguire, female    DOB: [birth year], 46 y.o.   MRN: [id number]  HPI  Pt presents to the clinic today for elevated blood pressure. Her BP today is 136/98. Her previous blood pressures were 132/82, 134/86. She was on Atenolol prior to establishing care with me, but stopped it because she felt like it made her tired. She thinks she needs to be put back on blood pressure medication. She has been having pressure headaches 2-3 times per week. She denies dizziness, visual changes, chest pain or shortness of breath.  Review of Systems      Past Medical History:  Diagnosis Date  . Anxiety   . Borderline hypertension    situational--  anxiety  . Depression   . GERD (gastroesophageal reflux disease)   . History of peptic ulcer    several years ago  . Hyperlipidemia   . Hypertension   . Hypothyroidism   . Menorrhagia   . Migraines   . Nocturia   . Wears glasses     Current Outpatient Prescriptions  Medication Sig Dispense Refill  . aspirin-acetaminophen-caffeine (EXCEDRIN MIGRAINE) 250-250-65 MG tablet Take by mouth every 6 (six) hours as needed for headache.    Marland Kitchen FLUoxetine (PROZAC) 40 MG capsule Take 1 capsule (40 mg total) by mouth 2 (two) times daily. 180 capsule 1  . fluticasone (FLONASE) 50 MCG/ACT nasal spray Place 2 sprays into both nostrils as needed.     Marland Kitchen levothyroxine (SYNTHROID, LEVOTHROID) 100 MCG tablet Take 1 tablet (100 mcg total) by mouth daily before breakfast. MUST SCHEDULE ANNUAL PHYSICAL FOR May 2018 90 tablet 1  . omeprazole (PRILOSEC) 20 MG capsule TAKE 1 CAPSULE (20 MG TOTAL) BY MOUTH DAILY. (Patient taking differently: TAKE 1 CAPSULE (20 MG TOTAL) BY MOUTH DAILY.-- TAKES IN AM) 90 capsule 0  . traZODone (DESYREL) 50 MG tablet TAKE 0.5-1 TABLETS (25-50 MG TOTAL) BY MOUTH AT BEDTIME AS NEEDED. FOR SLEEP 90 tablet 0  . lisinopril (PRINIVIL,ZESTRIL) 10 MG tablet Take 1 tablet (10 mg total) by mouth daily. 30 tablet 0   No  current facility-administered medications for this visit.     Allergies  Allergen Reactions  . Azithromycin Hives  . Eggs Or Egg-Derived Products Itching and Rash    THROAT ITCH    Family History  Problem Relation Age of Onset  . Mental illness Mother   . Arthritis Father   . Hyperlipidemia Father   . Hypertension Father   . Diabetes Father   . Arthritis Maternal Grandmother   . Diabetes Maternal Grandmother   . Arthritis Maternal Grandfather   . Diabetes Maternal Grandfather   . Arthritis Paternal Grandmother   . Breast cancer Paternal Grandmother     Social History   Social History  . Marital status: Married    Spouse name: N/A  . Number of children: N/A  . Years of education: N/A   Occupational History  . Not on file.   Social History Main Topics  . Smoking status: Never Smoker  . Smokeless tobacco: Never Used  . Alcohol use Yes     Comment: occasional  . Drug use: No  . Sexual activity: Yes    Birth control/ protection: IUD   Other Topics Concern  . Not on file   Social History Narrative  . No narrative on file     Constitutional: Pt reports headaches. Denies fever, malaise, fatigue, or abrupt weight changes.  Respiratory: Denies difficulty breathing,  shortness of breath, cough or sputum production.   Cardiovascular: Denies chest pain, chest tightness, palpitations or swelling in the hands or feet.  Neurological: Denies dizziness, difficulty with memory, difficulty with speech or problems with balance and coordination.    No other specific complaints in a complete review of systems (except as listed in HPI above).  Objective:   Physical Exam   BP (!) 136/98   Pulse 82   Temp 98.1 F (36.7 C) (Oral)   Wt 220 lb 8 oz (100 kg)   LMP 01/07/2017 (Approximate)   SpO2 98%   BMI 37.85 kg/m  Wt Readings from Last 3 Encounters:  01/28/17 220 lb 8 oz (100 kg)  01/10/17 222 lb (100.7 kg)  10/23/16 227 lb (103 kg)    General: Appears her stated  age, obese in NAD. Cardiovascular: Normal rate and rhythm. S1,S2 noted.  No murmur, rubs or gallops noted. Pulmonary/Chest: Normal effort and positive vesicular breath sounds. No respiratory distress. No wheezes, rales or ronchi noted.  Neurological: Alert and oriented.   BMET    Component Value Date/Time   NA 140 04/05/2016 0904   K 3.7 04/05/2016 0904   CL 102 04/05/2016 0904   CO2 32 04/05/2016 0904   GLUCOSE 103 (H) 04/05/2016 0904   BUN 12 04/05/2016 0904   CREATININE 0.67 04/05/2016 0904   CALCIUM 8.9 04/05/2016 0904    Lipid Panel     Component Value Date/Time   CHOL 184 04/05/2016 0904   TRIG 121.0 04/05/2016 0904   HDL 45.70 04/05/2016 0904   CHOLHDL 4 04/05/2016 0904   VLDL 24.2 04/05/2016 0904   LDLCALC 114 (H) 04/05/2016 0904    CBC    Component Value Date/Time   WBC 7.2 04/05/2016 0904   RBC 4.43 04/05/2016 0904   HGB 12.7 04/05/2016 0904   HCT 38.3 04/05/2016 0904   PLT 166.0 04/05/2016 0904   MCV 86.6 04/05/2016 0904   MCHC 33.1 04/05/2016 0904   RDW 14.5 04/05/2016 0904    Hgb A1C Lab Results  Component Value Date   HGBA1C 6.2 04/05/2016           Assessment & Plan:

## 2017-02-18 ENCOUNTER — Ambulatory Visit (INDEPENDENT_AMBULATORY_CARE_PROVIDER_SITE_OTHER): Payer: Federal, State, Local not specified - PPO | Admitting: Internal Medicine

## 2017-02-18 ENCOUNTER — Encounter: Payer: Self-pay | Admitting: Internal Medicine

## 2017-02-18 DIAGNOSIS — I10 Essential (primary) hypertension: Secondary | ICD-10-CM

## 2017-02-18 MED ORDER — LISINOPRIL 20 MG PO TABS: 20.0000 mg | ORAL_TABLET | Freq: Every day | ORAL | 2 refills | Status: DC

## 2017-02-18 NOTE — Assessment & Plan Note (Signed)
Still elevated Increase Lisinopril to 20 mg daily Work on diet and exercise for weight loss  RTC in 3 weeks for HTN followup

## 2017-02-18 NOTE — Progress Notes (Signed)
Subjective:    Patient ID: Anne Mcguire, female    DOB: January 29, 1971, 46 y.o.   MRN: [id number]  HPI  Pt presents to the clinic today for 3 week follow up of HTN. She was started on Lisinopril 10 mg daily at her last visit for a BP of 136/98. She has been taking the medication as prescribed. She has felt flushed in the mornings, but this is not really concerning her. Her BP today is 128/84. There is no ECG on file.  Review of Systems      Past Medical History:  Diagnosis Date  . Anxiety   . Borderline hypertension    situational--  anxiety  . Depression   . GERD (gastroesophageal reflux disease)   . History of peptic ulcer    several years ago  . Hyperlipidemia   . Hypertension   . Hypothyroidism   . Menorrhagia   . Migraines   . Nocturia   . Wears glasses     Current Outpatient Prescriptions  Medication Sig Dispense Refill  . aspirin-acetaminophen-caffeine (EXCEDRIN MIGRAINE) 250-250-65 MG tablet Take by mouth every 6 (six) hours as needed for headache.    Marland Kitchen FLUoxetine (PROZAC) 40 MG capsule Take 1 capsule (40 mg total) by mouth 2 (two) times daily. 180 capsule 1  . fluticasone (FLONASE) 50 MCG/ACT nasal spray Place 2 sprays into both nostrils as needed.     Marland Kitchen levothyroxine (SYNTHROID, LEVOTHROID) 100 MCG tablet Take 1 tablet (100 mcg total) by mouth daily before breakfast. MUST SCHEDULE ANNUAL PHYSICAL FOR May 2018 90 tablet 1  . lisinopril (PRINIVIL,ZESTRIL) 10 MG tablet Take 1 tablet (10 mg total) by mouth daily. 30 tablet 0  . omeprazole (PRILOSEC) 20 MG capsule TAKE 1 CAPSULE (20 MG TOTAL) BY MOUTH DAILY. (Patient taking differently: TAKE 1 CAPSULE (20 MG TOTAL) BY MOUTH DAILY.-- TAKES IN AM) 90 capsule 0  . traZODone (DESYREL) 50 MG tablet TAKE 0.5-1 TABLETS (25-50 MG TOTAL) BY MOUTH AT BEDTIME AS NEEDED. FOR SLEEP 90 tablet 0   No current facility-administered medications for this visit.     Allergies  Allergen Reactions  . Azithromycin Hives  . Eggs Or  Egg-Derived Products Itching and Rash    THROAT ITCH    Family History  Problem Relation Age of Onset  . Mental illness Mother   . Arthritis Father   . Hyperlipidemia Father   . Hypertension Father   . Diabetes Father   . Arthritis Maternal Grandmother   . Diabetes Maternal Grandmother   . Arthritis Maternal Grandfather   . Diabetes Maternal Grandfather   . Arthritis Paternal Grandmother   . Breast cancer Paternal Grandmother     Social History   Social History  . Marital status: Married    Spouse name: N/A  . Number of children: N/A  . Years of education: N/A   Occupational History  . Not on file.   Social History Main Topics  . Smoking status: Never Smoker  . Smokeless tobacco: Never Used  . Alcohol use Yes     Comment: occasional  . Drug use: No  . Sexual activity: Yes    Birth control/ protection: IUD   Other Topics Concern  . Not on file   Social History Narrative  . No narrative on file     Constitutional: Denies fever, malaise, fatigue, headache or abrupt weight changes.  Respiratory: Denies difficulty breathing, shortness of breath, cough or sputum production.   Cardiovascular: Denies chest pain, chest tightness,  palpitations or swelling in the hands or feet.  Neurological: Denies dizziness, difficulty with memory, difficulty with speech or problems with balance and coordination.    No other specific complaints in a complete review of systems (except as listed in HPI above).  Objective:   Physical Exam  BP 128/84   Pulse 82   Temp 98.2 F (36.8 C) (Oral)   Wt 222 lb (100.7 kg)   SpO2 98%   BMI 38.11 kg/m  Wt Readings from Last 3 Encounters:  02/18/17 222 lb (100.7 kg)  01/28/17 220 lb 8 oz (100 kg)  01/10/17 222 lb (100.7 kg)    General: Appears her stated age, obese in NAD. Cardiovascular: Normal rate and rhythm.  Neurological: Alert and oriented.   BMET    Component Value Date/Time   NA 140 04/05/2016 0904   K 3.7 04/05/2016  0904   CL 102 04/05/2016 0904   CO2 32 04/05/2016 0904   GLUCOSE 103 (H) 04/05/2016 0904   BUN 12 04/05/2016 0904   CREATININE 0.67 04/05/2016 0904   CALCIUM 8.9 04/05/2016 0904    Lipid Panel     Component Value Date/Time   CHOL 184 04/05/2016 0904   TRIG 121.0 04/05/2016 0904   HDL 45.70 04/05/2016 0904   CHOLHDL 4 04/05/2016 0904   VLDL 24.2 04/05/2016 0904   LDLCALC 114 (H) 04/05/2016 0904    CBC    Component Value Date/Time   WBC 7.2 04/05/2016 0904   RBC 4.43 04/05/2016 0904   HGB 12.7 04/05/2016 0904   HCT 38.3 04/05/2016 0904   PLT 166.0 04/05/2016 0904   MCV 86.6 04/05/2016 0904   MCHC 33.1 04/05/2016 0904   RDW 14.5 04/05/2016 0904    Hgb A1C Lab Results  Component Value Date   HGBA1C 6.2 04/05/2016            Assessment & Plan:

## 2017-02-18 NOTE — Patient Instructions (Signed)
Hypertension °Hypertension is another name for high blood pressure. High blood pressure forces your heart to work harder to pump blood. This can cause problems over time. °There are two numbers in a blood pressure reading. There is a top number (systolic) over a bottom number (diastolic). It is best to have a blood pressure below 120/80. Healthy choices can help lower your blood pressure. You may need medicine to help lower your blood pressure if: °· Your blood pressure cannot be lowered with healthy choices. °· Your blood pressure is higher than 130/80. °Follow these instructions at home: °Eating and drinking  °· If directed, follow the DASH eating plan. This diet includes: °¨ Filling half of your plate at each meal with fruits and vegetables. °¨ Filling one quarter of your plate at each meal with whole grains. Whole grains include whole wheat pasta, brown rice, and whole grain bread. °¨ Eating or drinking low-fat dairy products, such as skim milk or low-fat yogurt. °¨ Filling one quarter of your plate at each meal with low-fat (lean) proteins. Low-fat proteins include fish, skinless chicken, eggs, beans, and tofu. °¨ Avoiding fatty meat, cured and processed meat, or chicken with skin. °¨ Avoiding premade or processed food. °· Eat less than 1,500 mg of salt (sodium) a day. °· Limit alcohol use to no more than 1 drink a day for nonpregnant women and 2 drinks a day for men. One drink equals 12 oz of beer, 5 oz of wine, or 1½ oz of hard liquor. °Lifestyle  °· Work with your doctor to stay at a healthy weight or to lose weight. Ask your doctor what the best weight is for you. °· Get at least 30 minutes of exercise that causes your heart to beat faster (aerobic exercise) most days of the week. This may include walking, swimming, or biking. °· Get at least 30 minutes of exercise that strengthens your muscles (resistance exercise) at least 3 days a week. This may include lifting weights or pilates. °· Do not use any  products that contain nicotine or tobacco. This includes cigarettes and e-cigarettes. If you need help quitting, ask your doctor. °· Check your blood pressure at home as told by your doctor. °· Keep all follow-up visits as told by your doctor. This is important. °Medicines  °· Take over-the-counter and prescription medicines only as told by your doctor. Follow directions carefully. °· Do not skip doses of blood pressure medicine. The medicine does not work as well if you skip doses. Skipping doses also puts you at risk for problems. °· Ask your doctor about side effects or reactions to medicines that you should watch for. °Contact a doctor if: °· You think you are having a reaction to the medicine you are taking. °· You have headaches that keep coming back (recurring). °· You feel dizzy. °· You have swelling in your ankles. °· You have trouble with your vision. °Get help right away if: °· You get a very bad headache. °· You start to feel confused. °· You feel weak or numb. °· You feel faint. °· You get very bad pain in your: °¨ Chest. °¨ Belly (abdomen). °· You throw up (vomit) more than once. °· You have trouble breathing. °Summary °· Hypertension is another name for high blood pressure. °· Making healthy choices can help lower blood pressure. If your blood pressure cannot be controlled with healthy choices, you may need to take medicine. °This information is not intended to replace advice given to you by your   health care provider. Make sure you discuss any questions you have with your health care provider. °Document Released: 05/14/2008 Document Revised: 10/24/2016 Document Reviewed: 10/24/2016 °Elsevier Interactive Patient Education © 2017 Elsevier Inc[employer] ° °

## 2017-03-08 ENCOUNTER — Other Ambulatory Visit: Payer: Self-pay | Admitting: Internal Medicine

## 2017-03-08 DIAGNOSIS — K219 Gastro-esophageal reflux disease without esophagitis: Secondary | ICD-10-CM

## 2017-03-18 ENCOUNTER — Ambulatory Visit: Payer: Federal, State, Local not specified - PPO | Admitting: Internal Medicine

## 2017-04-04 ENCOUNTER — Encounter: Payer: Self-pay | Admitting: Primary Care

## 2017-04-04 ENCOUNTER — Ambulatory Visit (INDEPENDENT_AMBULATORY_CARE_PROVIDER_SITE_OTHER): Payer: Federal, State, Local not specified - PPO | Admitting: Primary Care

## 2017-04-04 VITALS — BP 122/84 | HR 81 | Temp 98.0°F | Wt 219.1 lb

## 2017-04-04 DIAGNOSIS — R112 Nausea with vomiting, unspecified: Secondary | ICD-10-CM

## 2017-04-04 MED ORDER — ONDANSETRON 4 MG PO TBDP: 4.0000 mg | ORAL_TABLET | Freq: Three times a day (TID) | ORAL | 0 refills | Status: DC | PRN

## 2017-04-04 NOTE — Progress Notes (Signed)
Subjective:    Patient ID: Anne Mcguire, female    DOB: 05/23/1971, 46 y.o.   MRN: [id number]  HPI  [name] is a 46 year old female who presents today with a chief complaint of diarrhea. She also reports mild nausea, and vomiting. Her symptoms began 4 days ago. She had a fever of 101 on Monday this week. Her vomiting stopped Tuesday, then began again last night. Her last episode of vomiting was last night. She's still experiencing diarrhea with 5-10 episodes daily. She's been eating small/bland foods until yesterday at lunch when she ate a normal meal. She ate [place] food last night. She did vomit last night. She's able to drink Sprite, water, and Gatorade. She denies bloody stools. She's taken one Zofran tablet from an old prescription with some improvement.   Review of Systems  Constitutional: Positive for chills and fatigue. Negative for fever.  Cardiovascular: Negative for palpitations.  Gastrointestinal: Positive for nausea and vomiting. Negative for abdominal pain.       Past Medical History:  Diagnosis Date  . Anxiety   . Borderline hypertension    situational--  anxiety  . Depression   . GERD (gastroesophageal reflux disease)   . History of peptic ulcer    several years ago  . Hyperlipidemia   . Hypertension   . Hypothyroidism   . Menorrhagia   . Migraines   . Nocturia   . Wears glasses      Social History   Social History  . Marital status: Married    Spouse name: N/A  . Number of children: N/A  . Years of education: N/A   Occupational History  . Not on file.   Social History Main Topics  . Smoking status: Never Smoker  . Smokeless tobacco: Never Used  . Alcohol use Yes     Comment: occasional  . Drug use: No  . Sexual activity: Yes    Birth control/ protection: IUD   Other Topics Concern  . Not on file   Social History Narrative  . No narrative on file    Past Surgical History:  Procedure Laterality Date  . DILITATION &  CURRETTAGE/HYSTROSCOPY WITH NOVASURE ABLATION N/A 01/10/2017   Procedure: DILATATION & CURETTAGE/HYSTEROSCOPY WITH NOVASURE ABLATION;  Surgeon: [name], DO;  Location: [facility];  Service: Gynecology;  Laterality: N/A;  . LAPAROSCOPIC TUBAL LIGATION Bilateral 01/10/2017   Procedure: LAPAROSCOPIC TUBAL LIGATION with Filshie Clips;  Surgeon: [name], DO;  Location: [facility];  Service: Gynecology;  Laterality: Bilateral;  . WISDOM TOOTH EXTRACTION  2013    Family History  Problem Relation Age of Onset  . Mental illness Mother   . Arthritis Father   . Hyperlipidemia Father   . Hypertension Father   . Diabetes Father   . Arthritis Maternal Grandmother   . Diabetes Maternal Grandmother   . Arthritis Maternal Grandfather   . Diabetes Maternal Grandfather   . Arthritis Paternal Grandmother   . Breast cancer Paternal Grandmother     Allergies  Allergen Reactions  . Azithromycin Hives  . Eggs Or Egg-Derived Products Itching and Rash    THROAT [facility]    Current Outpatient Prescriptions on File Prior to Visit  Medication Sig Dispense Refill  . aspirin-acetaminophen-caffeine (EXCEDRIN MIGRAINE) 250-250-65 MG tablet Take by mouth every 6 (six) hours as needed for headache.    Marland Kitchen FLUoxetine (PROZAC) 40 MG capsule Take 1 capsule (40 mg total) by mouth 2 (two) times daily.  180 capsule 1  . fluticasone (FLONASE) 50 MCG/ACT nasal spray Place 2 sprays into both nostrils as needed.     Marland Kitchen levothyroxine (SYNTHROID, LEVOTHROID) 100 MCG tablet Take 1 tablet (100 mcg total) by mouth daily before breakfast. MUST SCHEDULE ANNUAL PHYSICAL FOR May 2018 90 tablet 1  . lisinopril (PRINIVIL,ZESTRIL) 20 MG tablet Take 1 tablet (20 mg total) by mouth daily. 30 tablet 2  . omeprazole (PRILOSEC) 20 MG capsule TAKE 1 CAPSULE (20 MG TOTAL) BY MOUTH DAILY. 90 capsule 1  . traZODone (DESYREL) 50 MG tablet TAKE 0.5-1 TABLETS (25-50 MG TOTAL) BY MOUTH AT BEDTIME AS NEEDED. FOR SLEEP  90 tablet 0   No current facility-administered medications on file prior to visit.     BP 122/84   Pulse 81   Temp 98 F (36.7 C) (Oral)   Wt 219 lb 1.9 oz (99.4 kg)   SpO2 98%   BMI 37.61 kg/m    Objective:   Physical Exam  Constitutional: She appears well-nourished. She does not appear ill.  Neck: Neck supple.  Cardiovascular: Normal rate and regular rhythm.   Pulmonary/Chest: Effort normal and breath sounds normal.  Abdominal: Soft. Bowel sounds are normal. There is no tenderness.  Skin: Skin is warm and dry.          Assessment & Plan:  Viral Gastroenteritis:  N/V/D x 4 days. Overall vomiting has decreased, diarrhea continues. Exam today with abdominal tenderness, does not appear sickly, vitals do not indicate acute dehydration.  Suspect viral involvement and will treat with supportive measures. Rx for Zofran sent to pharmacy. Discussed importance of proper hydration and to advance diet with bland foods. Handout provided. Will have her start Imodium this Saturday if no improvement in diarrhea after she starts with a bland diet. Fluids, rest, work note provided.  Sheral Flow, NP

## 2017-04-04 NOTE — Patient Instructions (Signed)
Your symptoms are representative of a viral illness which will resolve on its own over time. Our goal is to treat your symptoms in order to aid your body in the healing process and to make you more comfortable.   You may take the ondansetron (Zofran) tablets every 8 hours as needed for nausea and vomiting. These are melting tablets.  You must work on staying hydrated with water and Gatorade.  Advance your diet as tolerated, take a look at the information below.  If no improvement in your diarrhea by Saturday this weekend, then start Imodium and follow the package instructions.  It was a pleasure meeting you!   Food Choices to Help Relieve Diarrhea, Adult When you have diarrhea, the foods you eat and your eating habits are very important. Choosing the right foods and drinks can help:  Relieve diarrhea.  Replace lost fluids and nutrients.  Prevent dehydration. What general guidelines should I follow? Relieving diarrhea   Choose foods with less than 2 g or .07 oz. of fiber per serving.  Limit fats to less than 8 tsp (38 g or 1.34 oz.) a day.  Avoid the following:  Foods and beverages sweetened with high-fructose corn syrup, honey, or sugar alcohols such as xylitol, sorbitol, and mannitol.  Foods that contain a lot of fat or sugar.  Fried, greasy, or spicy foods.  High-fiber grains, breads, and cereals.  Raw fruits and vegetables.  Eat foods that are rich in probiotics. These foods include dairy products such as yogurt and fermented milk products. They help increase healthy bacteria in the stomach and intestines (gastrointestinal tract, or GI tract).  If you have lactose intolerance, avoid dairy products. These may make your diarrhea worse.  Take medicine to help stop diarrhea (antidiarrheal medicine) only as told by your health care provider. Replacing nutrients   Eat small meals or snacks every 3-4 hours.  Eat bland foods, such as white rice, toast, or baked potato,  until your diarrhea starts to get better. Gradually reintroduce nutrient-rich foods as tolerated or as told by your health care provider. This includes:  Well-cooked protein foods.  Peeled, seeded, and soft-cooked fruits and vegetables.  Low-fat dairy products.  Take vitamin and mineral supplements as told by your health care provider. Preventing dehydration    Start by sipping water or a special solution to prevent dehydration (oral rehydration solution, ORS). Urine that is clear or pale yellow means that you are getting enough fluid.  Try to drink at least 8-10 cups of fluid each day to help replace lost fluids.  You may add other liquids in addition to water, such as clear juice or decaffeinated sports drinks, as tolerated or as told by your health care provider.  Avoid drinks with caffeine, such as coffee, tea, or soft drinks.  Avoid alcohol. What foods are recommended? The items listed may not be a complete list. Talk with your health care provider about what dietary choices are best for you. Grains  White rice. White, [place], or pita breads (fresh or toasted), including plain rolls, buns, or bagels. White pasta. Saltine, soda, or graham crackers. Pretzels. Low-fiber cereal. Cooked cereals made with water (such as cornmeal, farina, or cream cereals). Plain muffins. Matzo. Melba toast. Zwieback. Vegetables  Potatoes (without the skin). Most well-cooked and canned vegetables without skins or seeds. Tender lettuce. Fruits  Apple sauce. Fruits canned in juice. Cooked apricots, cherries, grapefruit, peaches, pears, or plums. Fresh bananas and cantaloupe. Meats and other protein foods  Baked or  boiled chicken. Eggs. Tofu. Fish. Seafood. Smooth nut butters. Ground or well-cooked tender beef, ham, veal, lamb, pork, or poultry. Dairy  Plain yogurt, kefir, and unsweetened liquid yogurt. Lactose-free milk, buttermilk, skim milk, or soy milk. Low-fat or nonfat hard cheese. Beverages    Water. Low-calorie sports drinks. Fruit juices without pulp. Strained tomato and vegetable juices. Decaffeinated teas. Sugar-free beverages not sweetened with sugar alcohols. Oral rehydration solutions, if approved by your health care provider. Seasoning and other foods  Bouillon, broth, or soups made from recommended foods. What foods are not recommended? The items listed may not be a complete list. Talk with your health care provider about what dietary choices are best for you. Grains  Whole grain, whole wheat, bran, or rye breads, rolls, pastas, and crackers. Wild or brown rice. Whole grain or bran cereals. Barley. Oats and oatmeal. Corn tortillas or taco shells. Granola. Popcorn. Vegetables  Raw vegetables. Fried vegetables. Cabbage, broccoli, Brussels sprouts, artichokes, baked beans, beet greens, corn, kale, legumes, peas, sweet potatoes, and yams. Potato skins. Cooked spinach and cabbage. Fruits  Dried fruit, including raisins and dates. Raw fruits. Stewed or dried prunes. Canned fruits with syrup. Meat and other protein foods  Fried or fatty meats. Deli meats. Chunky nut butters. Nuts and seeds. Beans and lentils. Berniece Salines. Hot dogs. Sausage. Dairy  High-fat cheeses. Whole milk, chocolate milk, and beverages made with milk, such as milk shakes. Half-and-half. Cream. sour cream. Ice cream. Beverages  Caffeinated beverages (such as coffee, tea, soda, or energy drinks). Alcoholic beverages. Fruit juices with pulp. Prune juice. Soft drinks sweetened with high-fructose corn syrup or sugar alcohols. High-calorie sports drinks. Fats and oils  Butter. Cream sauces. Margarine. Salad oils. Plain salad dressings. Olives. Avocados. Mayonnaise. Sweets and desserts  Sweet rolls, doughnuts, and sweet breads. Sugar-free desserts sweetened with sugar alcohols such as xylitol and sorbitol. Seasoning and other foods  Honey. Hot sauce. Chili powder. Gravy. Cream-based or milk-based soups. Pancakes and  waffles. Summary  When you have diarrhea, the foods you eat and your eating habits are very important.  Make sure you get at least 8-10 cups of fluid each day, or enough to keep your urine clear or pale yellow.  Eat bland foods and gradually reintroduce healthy, nutrient-rich foods as tolerated, or as told by your health care provider.  Avoid high-fiber, fried, greasy, or spicy foods. This information is not intended to replace advice given to you by your health care provider. Make sure you discuss any questions you have with your health care provider. Document Released: 02/16/2004 Document Revised: 11/23/2016 Document Reviewed: 11/23/2016 Elsevier Interactive Patient Education  2017 [employer].

## 2017-04-04 NOTE — Progress Notes (Signed)
Pre visit review using our clinic review tool, if applicable. No additional management support is needed unless otherwise documented below in the visit note. 

## 2017-04-07 ENCOUNTER — Other Ambulatory Visit: Payer: Self-pay | Admitting: Internal Medicine

## 2017-04-07 DIAGNOSIS — G4700 Insomnia, unspecified: Secondary | ICD-10-CM

## 2017-04-19 DIAGNOSIS — Z6838 Body mass index (BMI) 38.0-38.9, adult: Secondary | ICD-10-CM | POA: Diagnosis not present

## 2017-04-19 DIAGNOSIS — Z01419 Encounter for gynecological examination (general) (routine) without abnormal findings: Secondary | ICD-10-CM | POA: Diagnosis not present

## 2017-04-19 DIAGNOSIS — Z1231 Encounter for screening mammogram for malignant neoplasm of breast: Secondary | ICD-10-CM | POA: Diagnosis not present

## 2017-05-17 ENCOUNTER — Other Ambulatory Visit: Payer: Self-pay | Admitting: Internal Medicine

## 2017-06-21 DIAGNOSIS — K080 Exfoliation of teeth due to systemic causes: Secondary | ICD-10-CM | POA: Diagnosis not present

## 2017-06-22 ENCOUNTER — Other Ambulatory Visit: Payer: Self-pay | Admitting: Internal Medicine

## 2017-06-22 DIAGNOSIS — H34231 Retinal artery branch occlusion, right eye: Secondary | ICD-10-CM | POA: Diagnosis not present

## 2017-06-22 DIAGNOSIS — H25812 Combined forms of age-related cataract, left eye: Secondary | ICD-10-CM | POA: Diagnosis not present

## 2017-06-22 DIAGNOSIS — H25811 Combined forms of age-related cataract, right eye: Secondary | ICD-10-CM | POA: Diagnosis not present

## 2017-06-22 DIAGNOSIS — H35033 Hypertensive retinopathy, bilateral: Secondary | ICD-10-CM | POA: Diagnosis not present

## 2017-07-10 ENCOUNTER — Other Ambulatory Visit: Payer: Self-pay | Admitting: Internal Medicine

## 2017-07-10 DIAGNOSIS — G4700 Insomnia, unspecified: Secondary | ICD-10-CM

## 2017-07-11 DIAGNOSIS — H25811 Combined forms of age-related cataract, right eye: Secondary | ICD-10-CM | POA: Diagnosis not present

## 2017-07-11 HISTORY — PX: CATARACT EXTRACTION, BILATERAL: SHX1313

## 2017-07-24 DIAGNOSIS — H25811 Combined forms of age-related cataract, right eye: Secondary | ICD-10-CM | POA: Diagnosis not present

## 2017-07-24 DIAGNOSIS — H2511 Age-related nuclear cataract, right eye: Secondary | ICD-10-CM | POA: Diagnosis not present

## 2017-08-08 DIAGNOSIS — H2512 Age-related nuclear cataract, left eye: Secondary | ICD-10-CM | POA: Diagnosis not present

## 2017-08-08 DIAGNOSIS — H25812 Combined forms of age-related cataract, left eye: Secondary | ICD-10-CM | POA: Diagnosis not present

## 2017-08-21 ENCOUNTER — Other Ambulatory Visit: Payer: Self-pay | Admitting: Internal Medicine

## 2017-08-25 ENCOUNTER — Other Ambulatory Visit: Payer: Self-pay | Admitting: Internal Medicine

## 2017-08-26 ENCOUNTER — Encounter: Payer: Self-pay | Admitting: Internal Medicine

## 2017-08-26 ENCOUNTER — Ambulatory Visit (INDEPENDENT_AMBULATORY_CARE_PROVIDER_SITE_OTHER): Payer: Federal, State, Local not specified - PPO | Admitting: Internal Medicine

## 2017-08-26 VITALS — BP 122/80 | HR 82 | Temp 98.2°F | Ht 64.0 in | Wt 226.0 lb

## 2017-08-26 DIAGNOSIS — Z0000 Encounter for general adult medical examination without abnormal findings: Secondary | ICD-10-CM

## 2017-08-26 DIAGNOSIS — F5101 Primary insomnia: Secondary | ICD-10-CM | POA: Diagnosis not present

## 2017-08-26 DIAGNOSIS — G43909 Migraine, unspecified, not intractable, without status migrainosus: Secondary | ICD-10-CM | POA: Insufficient documentation

## 2017-08-26 DIAGNOSIS — F411 Generalized anxiety disorder: Secondary | ICD-10-CM

## 2017-08-26 DIAGNOSIS — E7800 Pure hypercholesterolemia, unspecified: Secondary | ICD-10-CM | POA: Diagnosis not present

## 2017-08-26 DIAGNOSIS — K219 Gastro-esophageal reflux disease without esophagitis: Secondary | ICD-10-CM | POA: Diagnosis not present

## 2017-08-26 DIAGNOSIS — G43C1 Periodic headache syndromes in child or adult, intractable: Secondary | ICD-10-CM | POA: Diagnosis not present

## 2017-08-26 DIAGNOSIS — I10 Essential (primary) hypertension: Secondary | ICD-10-CM

## 2017-08-26 DIAGNOSIS — E785 Hyperlipidemia, unspecified: Secondary | ICD-10-CM | POA: Insufficient documentation

## 2017-08-26 DIAGNOSIS — E039 Hypothyroidism, unspecified: Secondary | ICD-10-CM | POA: Diagnosis not present

## 2017-08-26 MED ORDER — BUSPIRONE HCL 5 MG PO TABS: 5.0000 mg | ORAL_TABLET | Freq: Two times a day (BID) | ORAL | 5 refills | Status: DC

## 2017-08-26 NOTE — Assessment & Plan Note (Signed)
Advised her to avoid foods that trigger her reflux Continue Prilosec for now

## 2017-08-26 NOTE — Assessment & Plan Note (Addendum)
Continue Prozac She is not interested in weaning this today Add in [place], eRx sent to pharmacy Support offered today Will monitor

## 2017-08-26 NOTE — Assessment & Plan Note (Signed)
Continue Excedrin Migraine as needed

## 2017-08-26 NOTE — Assessment & Plan Note (Signed)
CMET and Lipid profile today Encouraged her to consume a low fat diet 

## 2017-08-26 NOTE — Assessment & Plan Note (Signed)
Controlled with Trazadone Will monitor

## 2017-08-26 NOTE — Patient Instructions (Signed)

## 2017-08-26 NOTE — Progress Notes (Signed)
Subjective:    Patient ID: Anne Mcguire, female    DOB: [birth year], 46 y.o.   MRN: [id number]  HPI  Pt presents to the clinic today for her annual exam. She is also due to follow up chronic conditions.  HTN: Her BP today is 122/80. She is taking Lisinopril daily as prescribed. There is no ECG on file.  Anxiety and Depression: Chronic, and she feels like her anxiety is slightly worse. She is still taking Prozac. She denies SI/HI.  Insomnia: She has difficulty falling asleep. This is well managed with Trazadone as needed.  GERD: Hx of PUD. Triggered by spicy and tomato based foods. She denies breakthrough symptoms on Prilosec.  HLD: Her last LDL was 114, 03/2016. She is not currently taking any cholesterol lowering medications. She tries to consume a low fat diet.  Hypothyroidism: Her levels were last checked 03/2016. She denies any issues on her current dose of Synthroid.  Migraines: These occur about 1-2 times per month. She takes Excedrin Migraine as needed with good relief.  Flu: 10/2016 Tetanus: 08/2012 Pap Smear: 01/2016, [facility] Mammogram: 01/2016, [facility] Vision Screening: annually Dentist: biannually  Diet: She does eat meat. She consumes fruits and veggies daily. She tries to avoid fried foods. She drinks some coffee, energy drinks, soda. Exercise: None  Review of Systems      Past Medical History:  Diagnosis Date  . Anxiety   . Borderline hypertension    situational--  anxiety  . Depression   . GERD (gastroesophageal reflux disease)   . History of peptic ulcer    several years ago  . Hyperlipidemia   . Hypertension   . Hypothyroidism   . Menorrhagia   . Migraines   . Nocturia   . Wears glasses     Current Outpatient Prescriptions  Medication Sig Dispense Refill  . aspirin-acetaminophen-caffeine (EXCEDRIN MIGRAINE) 250-250-65 MG tablet Take by mouth every 6 (six) hours as needed for headache.    Marland Kitchen FLUoxetine (PROZAC) 40 MG  capsule TAKE 1 CAPSULE (40 MG TOTAL) BY MOUTH 2 (TWO) TIMES DAILY. 180 capsule 0  . fluticasone (FLONASE) 50 MCG/ACT nasal spray Place 2 sprays into both nostrils as needed.     Marland Kitchen levothyroxine (SYNTHROID, LEVOTHROID) 100 MCG tablet TAKE 1 TABLET BY MOUTH DAILY BEFORE BREAKFAST 90 tablet 0  . lisinopril (PRINIVIL,ZESTRIL) 20 MG tablet Take 1 tablet (20 mg total) by mouth daily. MUST SCHEDULE ANNUAL PHYSICAL FOR MORE REFILLS 30 tablet 1  . omeprazole (PRILOSEC) 20 MG capsule TAKE 1 CAPSULE (20 MG TOTAL) BY MOUTH DAILY. 90 capsule 1  . ondansetron (ZOFRAN ODT) 4 MG disintegrating tablet Take 1 tablet (4 mg total) by mouth every 8 (eight) hours as needed for nausea or vomiting. 20 tablet 0  . traZODone (DESYREL) 50 MG tablet Take 0.5-1 tablets (25-50 mg total) by mouth at bedtime. LAST REFILL MUST SCHEDULE ANNUAL EXAM 90 tablet 0   No current facility-administered medications for this visit.     Allergies  Allergen Reactions  . Azithromycin Hives  . Eggs Or Egg-Derived Products Itching and Rash    THROAT ITCH    Family History  Problem Relation Age of Onset  . Mental illness Mother   . Arthritis Father   . Hyperlipidemia Father   . Hypertension Father   . Diabetes Father   . Arthritis Maternal Grandmother   . Diabetes Maternal Grandmother   . Arthritis Maternal Grandfather   . Diabetes Maternal Grandfather   .  Arthritis Paternal Grandmother   . Breast cancer Paternal Grandmother     Social History   Social History  . Marital status: Married    Spouse name: N/A  . Number of children: N/A  . Years of education: N/A   Occupational History  . Not on file.   Social History Main Topics  . Smoking status: Never Smoker  . Smokeless tobacco: Never Used  . Alcohol use Yes     Comment: occasional  . Drug use: No  . Sexual activity: Yes    Birth control/ protection: IUD   Other Topics Concern  . Not on file   Social History Narrative  . No narrative on file      Constitutional: Pt reports weight gain. Denies fever, malaise, fatigue, headache.  HEENT: Denies eye pain, eye redness, ear pain, ringing in the ears, wax buildup, runny nose, nasal congestion, bloody nose, or sore throat. Respiratory: Denies difficulty breathing, shortness of breath, cough or sputum production.   Cardiovascular: Denies chest pain, chest tightness, palpitations or swelling in the hands or feet.  Gastrointestinal: Denies abdominal pain, bloating, constipation, diarrhea or blood in the stool.  GU: Denies urgency, frequency, pain with urination, burning sensation, blood in urine, odor or discharge. Musculoskeletal: Denies decrease in range of motion, difficulty with gait, muscle pain or joint pain and swelling.  Skin: Denies redness, rashes, lesions or ulcercations.  Neurological: Denies dizziness, difficulty with memory, difficulty with speech or problems with balance and coordination.  Psych: Pt reports history of anxiety and depression. Denies SI/HI.  No other specific complaints in a complete review of systems (except as listed in HPI above).  Objective:   Physical Exam   BP 122/80   Pulse 82   Temp 98.2 F (36.8 C) (Oral)   Ht 5\' 4"  (1.626 m)   Wt 226 lb (102.5 kg)   SpO2 98%   BMI 38.79 kg/m  Wt Readings from Last 3 Encounters:  08/26/17 226 lb (102.5 kg)  04/04/17 219 lb 1.9 oz (99.4 kg)  02/18/17 222 lb (100.7 kg)    General: Appears her stated age, obese in NAD. Skin: Warm, dry and intact. HEENT: Head: normal shape and size; Eyes: sclera white, no icterus, conjunctiva pink, PERRLA and EOMs intact; Ears: Tm's gray and intact, normal light reflex; Throat/Mouth: Teeth present, mucosa pink and moist, no exudate, lesions or ulcerations noted.  Neck:  Neck supple, trachea midline. No masses, lumps present.  Cardiovascular: Normal rate and rhythm. S1,S2 noted.  No murmur, rubs or gallops noted. No JVD or BLE edema.  Pulmonary/Chest: Normal effort and  positive vesicular breath sounds. No respiratory distress. No wheezes, rales or ronchi noted.  Abdomen: Soft and nontender. Normal bowel sounds. No distention or masses noted. Liver, spleen and kidneys non palpable. Musculoskeletal: Strength 5/5 BUE/BLE, No difficulty with gait.  Neurological: Alert and oriented. Cranial nerves II-XII grossly intact. Coordination normal.  Psychiatric: Mood and affect normal. Behavior is normal. Judgment and thought content normal.     BMET    Component Value Date/Time   NA 140 04/05/2016 0904   K 3.7 04/05/2016 0904   CL 102 04/05/2016 0904   CO2 32 04/05/2016 0904   GLUCOSE 103 (H) 04/05/2016 0904   BUN 12 04/05/2016 0904   CREATININE 0.67 04/05/2016 0904   CALCIUM 8.9 04/05/2016 0904    Lipid Panel     Component Value Date/Time   CHOL 184 04/05/2016 0904   TRIG 121.0 04/05/2016 0904   HDL 45.70  04/05/2016 0904   CHOLHDL 4 04/05/2016 0904   VLDL 24.2 04/05/2016 0904   LDLCALC 114 (H) 04/05/2016 0904    CBC    Component Value Date/Time   WBC 7.2 04/05/2016 0904   RBC 4.43 04/05/2016 0904   HGB 12.7 04/05/2016 0904   HCT 38.3 04/05/2016 0904   PLT 166.0 04/05/2016 0904   MCV 86.6 04/05/2016 0904   MCHC 33.1 04/05/2016 0904   RDW 14.5 04/05/2016 0904    Hgb A1C Lab Results  Component Value Date   HGBA1C 6.2 04/05/2016           Assessment & Plan:   Preventative Health Maintenance:  Encouraged her to get a flu shot in the fall Tetanus UTD Pap smear and mammogram UTD, will request a copy from [facility] Encouraged her to consume a balanced diet and exercise regimen Advised her to see an eye doctor and dentist annually Will check CBC, CMET, Lipid, TSH, Free T4 and A1C today  RTC in 1 year, sooner if needed Webb Silversmith, NP

## 2017-08-26 NOTE — Assessment & Plan Note (Signed)
Will check TSH and Free T4 today Will adjust and refill Synthroid as needed based on labs

## 2017-08-26 NOTE — Assessment & Plan Note (Signed)
Controlled on Lisinopril CMET today Will monitor 

## 2017-08-29 ENCOUNTER — Other Ambulatory Visit (INDEPENDENT_AMBULATORY_CARE_PROVIDER_SITE_OTHER): Payer: Federal, State, Local not specified - PPO

## 2017-08-29 DIAGNOSIS — E039 Hypothyroidism, unspecified: Secondary | ICD-10-CM

## 2017-08-29 DIAGNOSIS — Z0000 Encounter for general adult medical examination without abnormal findings: Secondary | ICD-10-CM

## 2017-08-29 LAB — COMPREHENSIVE METABOLIC PANEL
ALT: 11 U/L (ref 0–35)
AST: 12 U/L (ref 0–37)
Albumin: 4.1 g/dL (ref 3.5–5.2)
Alkaline Phosphatase: 52 U/L (ref 39–117)
BUN: 19 mg/dL (ref 6–23)
CO2: 31 mEq/L (ref 19–32)
Calcium: 9.5 mg/dL (ref 8.4–10.5)
Chloride: 103 mEq/L (ref 96–112)
Creatinine, Ser: 0.76 mg/dL (ref 0.40–1.20)
GFR: 87.24 mL/min (ref >60.00)
Glucose, Bld: 105 mg/dL — ABNORMAL HIGH (ref 70–99)
Potassium: 4.7 mEq/L (ref 3.5–5.1)
Sodium: 139 mEq/L (ref 135–145)
Total Bilirubin: 0.4 mg/dL (ref 0.2–1.2)
Total Protein: 6.8 g/dL (ref 6.0–8.3)

## 2017-08-29 LAB — LIPID PANEL
Cholesterol: 184 mg/dL (ref 0–200)
HDL: 58.6 mg/dL (ref >39.00)
LDL Cholesterol: 91 mg/dL (ref 0–99)
NonHDL: 125.76
Total CHOL/HDL Ratio: 3
Triglycerides: 172 mg/dL — ABNORMAL HIGH (ref 0.0–149.0)
VLDL: 34.4 mg/dL (ref 0.0–40.0)

## 2017-08-29 LAB — TSH: TSH: 0.62 u[IU]/mL (ref 0.35–4.50)

## 2017-08-29 LAB — CBC
HCT: 40.8 % (ref 36.0–46.0)
Hemoglobin: 13.1 g/dL (ref 12.0–15.0)
MCHC: 32.1 g/dL (ref 30.0–36.0)
MCV: 90.7 fl (ref 78.0–100.0)
Platelets: 158 10*3/uL (ref 150.0–400.0)
RBC: 4.5 Mil/uL (ref 3.87–5.11)
RDW: 15 % (ref 11.5–15.5)
WBC: 7.1 10*3/uL (ref 4.0–10.5)

## 2017-08-29 LAB — HEMOGLOBIN A1C: Hgb A1c MFr Bld: 5.9 % (ref 4.6–6.5)

## 2017-08-29 LAB — T4, FREE: Free T4: 0.89 ng/dL (ref 0.60–1.60)

## 2017-09-05 ENCOUNTER — Other Ambulatory Visit: Payer: Self-pay | Admitting: Internal Medicine

## 2017-09-05 DIAGNOSIS — K219 Gastro-esophageal reflux disease without esophagitis: Secondary | ICD-10-CM

## 2017-09-23 ENCOUNTER — Other Ambulatory Visit: Payer: Self-pay | Admitting: Internal Medicine

## 2017-10-16 ENCOUNTER — Other Ambulatory Visit: Payer: Self-pay | Admitting: Internal Medicine

## 2017-10-16 DIAGNOSIS — G4700 Insomnia, unspecified: Secondary | ICD-10-CM

## 2017-10-16 NOTE — Telephone Encounter (Signed)
Will go electronically.

## 2017-10-16 NOTE — Telephone Encounter (Signed)
Last refill 07/10/17 #90  Last OV 08/26/17 Ok to refill?

## 2017-11-27 ENCOUNTER — Other Ambulatory Visit: Payer: Self-pay | Admitting: Internal Medicine

## 2017-12-12 ENCOUNTER — Telehealth: Payer: Self-pay | Admitting: Internal Medicine

## 2017-12-12 ENCOUNTER — Encounter: Payer: Self-pay | Admitting: Internal Medicine

## 2017-12-12 ENCOUNTER — Ambulatory Visit: Payer: Federal, State, Local not specified - PPO | Admitting: Internal Medicine

## 2017-12-12 DIAGNOSIS — F419 Anxiety disorder, unspecified: Secondary | ICD-10-CM

## 2017-12-12 DIAGNOSIS — F32A Depression, unspecified: Secondary | ICD-10-CM

## 2017-12-12 DIAGNOSIS — F329 Major depressive disorder, single episode, unspecified: Secondary | ICD-10-CM | POA: Diagnosis not present

## 2017-12-12 MED ORDER — BUPROPION HCL ER (XL) 150 MG PO TB24: 150.0000 mg | ORAL_TABLET | Freq: Every day | ORAL | 2 refills | Status: DC

## 2017-12-12 MED ORDER — HYDROXYZINE HCL 10 MG PO TABS: 10.0000 mg | ORAL_TABLET | Freq: Every day | ORAL | 0 refills | Status: DC | PRN

## 2017-12-12 NOTE — Progress Notes (Signed)
Subjective:    Patient ID: Anne Mcguire, female    DOB: 12/29/1970, 47 y.o.   MRN: [id number]  HPI  Pt presents to the clinic today to follow up anxiety and depression. She reports her anxiety and depression is worse lately. She has not been to work in the last 2 weeks because she is part of the government shutdown. She has no motivation to leave home. She doesn't care about anything. She is crying all the time. Her mind races at night so that she can't sleep. She is taking Prozac 40 mg daily (supposed to be BID but she can't remember to take the evening dose). She weaned herself off her Buspar because she reports it was making her angry. She reports she was on Xanax in the past, and needs something that will help her now. She reports she tried to make an appt with psych before coming in today, but they would not let her until they reviewed her records. She denies SI/HI.  Review of Systems      Past Medical History:  Diagnosis Date  . Anxiety   . Borderline hypertension    situational--  anxiety  . Depression   . GERD (gastroesophageal reflux disease)   . History of peptic ulcer    several years ago  . Hyperlipidemia   . Hypertension   . Hypothyroidism   . Menorrhagia   . Migraines   . Nocturia   . Wears glasses     Current Outpatient Medications  Medication Sig Dispense Refill  . aspirin-acetaminophen-caffeine (EXCEDRIN MIGRAINE) 250-250-65 MG tablet Take by mouth every 6 (six) hours as needed for headache.    Marland Kitchen FLUoxetine (PROZAC) 40 MG capsule TAKE 1 CAPSULE (40 MG TOTAL) BY MOUTH 2 (TWO) TIMES DAILY. 180 capsule 1  . fluticasone (FLONASE) 50 MCG/ACT nasal spray Place 2 sprays into both nostrils as needed.     Marland Kitchen levothyroxine (SYNTHROID, LEVOTHROID) 100 MCG tablet TAKE 1 TABLET BY MOUTH DAILY BEFORE BREAKFAST 90 tablet 0  . lisinopril (PRINIVIL,ZESTRIL) 20 MG tablet Take 1 tablet (20 mg total) by mouth daily. 90 tablet 2  . omeprazole (PRILOSEC) 20 MG capsule TAKE 1  CAPSULE (20 MG TOTAL) BY MOUTH DAILY. 90 capsule 2  . ondansetron (ZOFRAN ODT) 4 MG disintegrating tablet Take 1 tablet (4 mg total) by mouth every 8 (eight) hours as needed for nausea or vomiting. 20 tablet 0  . traZODone (DESYREL) 50 MG tablet TAKE 1/2-1 TABLET (25-50 MG TOTAL) BY MOUTH AT BEDTIME. LAST REFILL MUST SCHEDULE ANNUAL EXAM. 90 tablet 0  . buPROPion (WELLBUTRIN XL) 150 MG 24 hr tablet Take 1 tablet (150 mg total) by mouth daily. 30 tablet 2  . hydrOXYzine (ATARAX/VISTARIL) 10 MG tablet Take 1 tablet (10 mg total) by mouth daily as needed. 30 tablet 0   No current facility-administered medications for this visit.     Allergies  Allergen Reactions  . Azithromycin Hives  . Eggs Or Egg-Derived Products Itching and Rash    THROAT ITCH    Family History  Problem Relation Age of Onset  . Mental illness Mother   . Arthritis Father   . Hyperlipidemia Father   . Hypertension Father   . Diabetes Father   . Arthritis Maternal Grandmother   . Diabetes Maternal Grandmother   . Arthritis Maternal Grandfather   . Diabetes Maternal Grandfather   . Arthritis Paternal Grandmother   . Breast cancer Paternal Grandmother     Social History   Socioeconomic  History  . Marital status: Married    Spouse name: Not on file  . Number of children: Not on file  . Years of education: Not on file  . Highest education level: Not on file  Social Needs  . Financial resource strain: Not on file  . Food insecurity - worry: Not on file  . Food insecurity - inability: Not on file  . Transportation needs - medical: Not on file  . Transportation needs - non-medical: Not on file  Occupational History  . Not on file  Tobacco Use  . Smoking status: Never Smoker  . Smokeless tobacco: Never Used  Substance and Sexual Activity  . Alcohol use: Yes    Comment: occasional  . Drug use: No  . Sexual activity: Yes    Birth control/protection: IUD  Other Topics Concern  . Not on file  Social History  Narrative  . Not on file     Constitutional: Denies fever, malaise, fatigue, headache or abrupt weight changes.  Neurological: Pt reports insomnia. Denies dizziness, difficulty with memory, difficulty with speech or problems with balance and coordination.  Psych: Pt reports anxiety and depression. Denies SI/HI.  No other specific complaints in a complete review of systems (except as listed in HPI above).  Objective:   Physical Exam   BP 126/82   Pulse 84   Temp 98.2 F (36.8 C) (Oral)   Wt 229 lb 12 oz (104.2 kg)   SpO2 96%   BMI 39.44 kg/m  Wt Readings from Last 3 Encounters:  12/12/17 229 lb 12 oz (104.2 kg)  08/26/17 226 lb (102.5 kg)  04/04/17 219 lb 1.9 oz (99.4 kg)    General: Appears her stated age, obese in NAD. Neurological: Alert and oriented.  Psychiatric: She is tearful, crying and upset.  BMET    Component Value Date/Time   NA 139 08/29/2017 0852   K 4.7 08/29/2017 0852   CL 103 08/29/2017 0852   CO2 31 08/29/2017 0852   GLUCOSE 105 (H) 08/29/2017 0852   BUN 19 08/29/2017 0852   CREATININE 0.76 08/29/2017 0852   CALCIUM 9.5 08/29/2017 0852    Lipid Panel     Component Value Date/Time   CHOL 184 08/29/2017 0852   TRIG 172.0 (H) 08/29/2017 0852   HDL 58.60 08/29/2017 0852   CHOLHDL 3 08/29/2017 0852   VLDL 34.4 08/29/2017 0852   LDLCALC 91 08/29/2017 0852    CBC    Component Value Date/Time   WBC 7.1 08/29/2017 0852   RBC 4.50 08/29/2017 0852   HGB 13.1 08/29/2017 0852   HCT 40.8 08/29/2017 0852   PLT 158.0 08/29/2017 0852   MCV 90.7 08/29/2017 0852   MCHC 32.1 08/29/2017 0852   RDW 15.0 08/29/2017 0852    Hgb A1C Lab Results  Component Value Date   HGBA1C 5.9 08/29/2017           Assessment & Plan:

## 2017-12-12 NOTE — Telephone Encounter (Signed)
She needs an appt to discuss. I will likely not prescribe Xanax, but there are alternatives.

## 2017-12-12 NOTE — Patient Instructions (Signed)
Persistent Depressive Disorder Persistent depressive disorder (PDD) is a mental health condition. PDD causes symptoms of low-level depression for 2 years or longer. It may also be called long-term (chronic) depression or dysthymia. PDD may include episodes of more severe depression that last for about 2 weeks (major depressive disorder or MDD). PDD can affect the way you think, feel, and sleep. This condition may also affect your relationships. You may be more likely to get sick if you have PDD. Symptoms of PDD occur for most of the day and may include:  Feeling tired (fatigue).  Low energy.  Eating too much or too little.  Sleeping too much or too little.  Feeling restless or agitated.  Feeling hopeless.  Feeling worthless or guilty.  Feeling worried or nervous (anxiety).  Trouble concentrating or making decisions.  Low self-esteem.  A negative way of looking at things (outlook).  Not being able to have fun or feel pleasure.  Avoiding interacting with people.  Getting angry or annoyed easily (irritability).  Acting aggressive or angry.  Follow these instructions at home: Activity  Go back to your normal activities as told by your doctor.  Exercise regularly as told by your doctor. General instructions  Take over-the-counter and prescription medicines only as told by your doctor.  Do not drink alcohol. Or, limit how much alcohol you drink to no more than 1 drink a day for nonpregnant women and 2 drinks a day for men. One drink equals 12 oz of beer, 5 oz of wine, or 1 oz of hard liquor. Alcohol can affect any antidepressant medicines you are taking. Talk with your doctor about your alcohol use.  Eat a healthy diet and get plenty of sleep.  Find activities that you enjoy each day.  Consider joining a support group. Your doctor may be able to suggest a support group.  Keep all follow-up visits as told by your doctor. This is important. Where to find more  information: National Alliance on Mental Illness  www.nami.org  U.[place]. National Institute of Mental Health  www.nimh.nih.gov  National Suicide Prevention Lifeline  1-800-273-TALK (1-800-273-8255). This is free, 24-hour help.  Contact a doctor if:  Your symptoms get worse.  You have new symptoms.  You have trouble sleeping or doing your daily activities. Get help right away if:  You self-harm.  You have serious thoughts about hurting yourself or others.  You see, hear, taste, smell, or feel things that are not there (hallucinate). This information is not intended to replace advice given to you by your health care provider. Make sure you discuss any questions you have with your health care provider. Document Released: 11/07/2015 Document Revised: 07/20/2016 Document Reviewed: 07/20/2016 Elsevier Interactive Patient Education  2017 [employer] Inc.  

## 2017-12-12 NOTE — Telephone Encounter (Signed)
Pt crying emotional wants some medication to calm her down. Pt states used to take xanax. Pt uses [facility] [address]. Pt ph [phone].

## 2017-12-12 NOTE — Assessment & Plan Note (Signed)
Deteriorated Support offered today Increase Prozac to 40 mg BID eRx for Wellbutrin 150 mg in addition to Prozac eRx for Hydroxyzine to be taken daily prn- sedation caution given Offered to refer her to psychology for therapy but she declines at this time

## 2018-01-01 DIAGNOSIS — K080 Exfoliation of teeth due to systemic causes: Secondary | ICD-10-CM | POA: Diagnosis not present

## 2018-01-08 ENCOUNTER — Other Ambulatory Visit: Payer: Self-pay | Admitting: Internal Medicine

## 2018-01-09 NOTE — Telephone Encounter (Signed)
Can you see how often she has been taking this?

## 2018-01-09 NOTE — Telephone Encounter (Signed)
Last filled 12/12/17... Please advise if okay to refill

## 2018-01-10 NOTE — Telephone Encounter (Signed)
Pt reports that she did not request a refill... Wellbutrin is working well and has only taking hydroxyzine as needed, so no refill needed at this time

## 2018-01-10 NOTE — Telephone Encounter (Signed)
noted 

## 2018-01-15 ENCOUNTER — Other Ambulatory Visit: Payer: Self-pay | Admitting: Internal Medicine

## 2018-01-15 DIAGNOSIS — G4700 Insomnia, unspecified: Secondary | ICD-10-CM

## 2018-01-16 NOTE — Telephone Encounter (Signed)
Last filled 10/2017... Please advise if okay to refill, pt recently seen for anxiety

## 2018-02-03 DIAGNOSIS — J988 Other specified respiratory disorders: Secondary | ICD-10-CM | POA: Diagnosis not present

## 2018-03-08 ENCOUNTER — Other Ambulatory Visit: Payer: Self-pay | Admitting: Internal Medicine

## 2018-04-01 ENCOUNTER — Other Ambulatory Visit: Payer: Self-pay | Admitting: Internal Medicine

## 2018-04-07 ENCOUNTER — Ambulatory Visit: Payer: Federal, State, Local not specified - PPO | Admitting: Internal Medicine

## 2018-04-07 ENCOUNTER — Encounter: Payer: Self-pay | Admitting: Internal Medicine

## 2018-04-07 VITALS — BP 126/84 | HR 78 | Temp 98.3°F | Wt 229.0 lb

## 2018-04-07 DIAGNOSIS — R5383 Other fatigue: Secondary | ICD-10-CM

## 2018-04-07 DIAGNOSIS — F419 Anxiety disorder, unspecified: Secondary | ICD-10-CM | POA: Diagnosis not present

## 2018-04-07 DIAGNOSIS — E039 Hypothyroidism, unspecified: Secondary | ICD-10-CM

## 2018-04-07 DIAGNOSIS — R251 Tremor, unspecified: Secondary | ICD-10-CM | POA: Diagnosis not present

## 2018-04-07 DIAGNOSIS — E559 Vitamin D deficiency, unspecified: Secondary | ICD-10-CM

## 2018-04-07 DIAGNOSIS — F329 Major depressive disorder, single episode, unspecified: Secondary | ICD-10-CM | POA: Diagnosis not present

## 2018-04-07 DIAGNOSIS — F32A Depression, unspecified: Secondary | ICD-10-CM

## 2018-04-07 LAB — COMPREHENSIVE METABOLIC PANEL
ALT: 11 U/L (ref 0–35)
AST: 12 U/L (ref 0–37)
Albumin: 4 g/dL (ref 3.5–5.2)
Alkaline Phosphatase: 52 U/L (ref 39–117)
BUN: 11 mg/dL (ref 6–23)
CO2: 30 mEq/L (ref 19–32)
Calcium: 9 mg/dL (ref 8.4–10.5)
Chloride: 103 mEq/L (ref 96–112)
Creatinine, Ser: 0.69 mg/dL (ref 0.40–1.20)
GFR: 97.27 mL/min (ref >60.00)
Glucose, Bld: 106 mg/dL — ABNORMAL HIGH (ref 70–99)
Potassium: 4.2 mEq/L (ref 3.5–5.1)
Sodium: 140 mEq/L (ref 135–145)
Total Bilirubin: 0.3 mg/dL (ref 0.2–1.2)
Total Protein: 6.8 g/dL (ref 6.0–8.3)

## 2018-04-07 LAB — CBC
HCT: 40.2 % (ref 36.0–46.0)
Hemoglobin: 13.4 g/dL (ref 12.0–15.0)
MCHC: 33.3 g/dL (ref 30.0–36.0)
MCV: 90.7 fl (ref 78.0–100.0)
Platelets: 157 10*3/uL (ref 150.0–400.0)
RBC: 4.43 Mil/uL (ref 3.87–5.11)
RDW: 15 % (ref 11.5–15.5)
WBC: 6 10*3/uL (ref 4.0–10.5)

## 2018-04-07 LAB — TSH: TSH: 0.7 u[IU]/mL (ref 0.35–4.50)

## 2018-04-07 LAB — T4, FREE: Free T4: 0.85 ng/dL (ref 0.60–1.60)

## 2018-04-07 LAB — VITAMIN B12: Vitamin B-12: 398 pg/mL (ref 211–911)

## 2018-04-07 LAB — HEMOGLOBIN A1C: Hgb A1c MFr Bld: 5.9 % (ref 4.6–6.5)

## 2018-04-07 LAB — VITAMIN D 25 HYDROXY (VIT D DEFICIENCY, FRACTURES): VITD: 24.86 ng/mL — ABNORMAL LOW (ref 30.00–100.00)

## 2018-04-07 NOTE — Patient Instructions (Signed)

## 2018-04-07 NOTE — Progress Notes (Signed)
Subjective:    Patient ID: Anne Mcguire, female    DOB: 12/24/1970, 47 y.o.   MRN: [id number]  HPI  Pt presents to the clinic today with c/o fatigue and trembling. She reports this started about 5 months ago. She reports she is so tired, she falling asleep at work and while driving home from work. She reports she never sleeps through the night because her dog wakes her up. She has also started getting up in the middle of the night to urinate. She averages 6-7 hours of sleep per night. She is not sure if she snores. She does not always feels rested when she wakes up. The trembling seems to come out of nowhere. Sometimes eating will help, sometimes it won't. She is not sure if she is dehydrated, but she reports she does not drink a lot of water and she is thirst all the time. She does report craving sweets as well. She has not really tried anything OTC for her symptoms. She does have a history of anxiety, depression, and hypothyroidism.  Review of Systems      Past Medical History:  Diagnosis Date  . Anxiety   . Borderline hypertension    situational--  anxiety  . Depression   . GERD (gastroesophageal reflux disease)   . History of peptic ulcer    several years ago  . Hyperlipidemia   . Hypertension   . Hypothyroidism   . Menorrhagia   . Migraines   . Nocturia   . Wears glasses     Current Outpatient Medications  Medication Sig Dispense Refill  . aspirin-acetaminophen-caffeine (EXCEDRIN MIGRAINE) 250-250-65 MG tablet Take by mouth every 6 (six) hours as needed for headache.    Marland Kitchen buPROPion (WELLBUTRIN XL) 150 MG 24 hr tablet TAKE 1 TABLET BY MOUTH EVERY DAY 30 tablet 2  . FLUoxetine (PROZAC) 40 MG capsule TAKE 1 CAPSULE (40 MG TOTAL) BY MOUTH 2 (TWO) TIMES DAILY. 180 capsule 1  . fluticasone (FLONASE) 50 MCG/ACT nasal spray Place 2 sprays into both nostrils as needed.     . hydrOXYzine (ATARAX/VISTARIL) 10 MG tablet Take 1 tablet (10 mg total) by mouth daily as needed. 30  tablet 0  . levothyroxine (SYNTHROID, LEVOTHROID) 100 MCG tablet TAKE 1 TABLET BY MOUTH DAILY BEFORE BREAKFAST 90 tablet 0  . lisinopril (PRINIVIL,ZESTRIL) 20 MG tablet Take 1 tablet (20 mg total) by mouth daily. 90 tablet 2  . omeprazole (PRILOSEC) 20 MG capsule TAKE 1 CAPSULE (20 MG TOTAL) BY MOUTH DAILY. 90 capsule 2  . ondansetron (ZOFRAN ODT) 4 MG disintegrating tablet Take 1 tablet (4 mg total) by mouth every 8 (eight) hours as needed for nausea or vomiting. 20 tablet 0  . traZODone (DESYREL) 50 MG tablet Take 0.5-1 tablets (25-50 mg total) by mouth at bedtime as needed for sleep. 90 tablet 0   No current facility-administered medications for this visit.     Allergies  Allergen Reactions  . Azithromycin Hives  . Eggs Or Egg-Derived Products Itching and Rash    THROAT ITCH    Family History  Problem Relation Age of Onset  . Mental illness Mother   . Arthritis Father   . Hyperlipidemia Father   . Hypertension Father   . Diabetes Father   . Arthritis Maternal Grandmother   . Diabetes Maternal Grandmother   . Arthritis Maternal Grandfather   . Diabetes Maternal Grandfather   . Arthritis Paternal Grandmother   . Breast cancer Paternal Grandmother  Social History   Socioeconomic History  . Marital status: Married    Spouse name: Not on file  . Number of children: Not on file  . Years of education: Not on file  . Highest education level: Not on file  Occupational History  . Not on file  Social Needs  . Financial resource strain: Not on file  . Food insecurity:    Worry: Not on file    Inability: Not on file  . Transportation needs:    Medical: Not on file    Non-medical: Not on file  Tobacco Use  . Smoking status: Never Smoker  . Smokeless tobacco: Never Used  Substance and Sexual Activity  . Alcohol use: Yes    Comment: occasional  . Drug use: No  . Sexual activity: Yes    Birth control/protection: IUD  Lifestyle  . Physical activity:    Days per week:  Not on file    Minutes per session: Not on file  . Stress: Not on file  Relationships  . Social connections:    Talks on phone: Not on file    Gets together: Not on file    Attends religious service: Not on file    Active member of club or organization: Not on file    Attends meetings of clubs or organizations: Not on file    Relationship status: Not on file  . Intimate partner violence:    Fear of current or ex partner: Not on file    Emotionally abused: Not on file    Physically abused: Not on file    Forced sexual activity: Not on file  Other Topics Concern  . Not on file  Social History Narrative  . Not on file     Constitutional: Pt reports fatigue, weight gain. Denies fever, malaise, headache .  Respiratory: Denies difficulty breathing, shortness of breath, cough or sputum production.   Cardiovascular: Denies chest pain, chest tightness, palpitations or swelling in the hands or feet.  GU: Pt reports nocturia. Denies urgency, frequency, pain with urination, burning sensation, blood in urine, odor or discharge. Musculoskeletal: Denies decrease in range of motion, difficulty with gait, muscle pain or joint pain and swelling.  Neurological: Denies dizziness, difficulty with memory, difficulty with speech or problems with balance and coordination.  Psych: Pt has a history of anxiety and depression. Denies SI/HI.  No other specific complaints in a complete review of systems (except as listed in HPI above).  Objective:   Physical Exam  BP 126/84   Pulse 78   Temp 98.3 F (36.8 C) (Oral)   Wt 229 lb (103.9 kg)   SpO2 98%   BMI 39.31 kg/m  Wt Readings from Last 3 Encounters:  04/07/18 229 lb (103.9 kg)  12/12/17 229 lb 12 oz (104.2 kg)  08/26/17 226 lb (102.5 kg)    General: Appears her stated age, obese in NAD. Skin: Warm, dry and intact. No rashes, or ulcerations noted. Neck:  Neck supple, trachea midline. No masses, lumps present.  Cardiovascular: Normal rate and  rhythm. S1,S2 noted.  No murmur, rubs or gallops noted. Pulmonary/Chest: Normal effort and positive vesicular breath sounds. No respiratory distress. No wheezes, rales or ronchi noted.  Neurological: Alert and oriented.  Psychiatric: Mood and affect normal. Behavior is normal. Judgment and thought content normal.     BMET    Component Value Date/Time   NA 139 08/29/2017 0852   K 4.7 08/29/2017 0852   CL 103 08/29/2017 7371  CO2 31 08/29/2017 0852   GLUCOSE 105 (H) 08/29/2017 0852   BUN 19 08/29/2017 0852   CREATININE 0.76 08/29/2017 0852   CALCIUM 9.5 08/29/2017 0852    Lipid Panel     Component Value Date/Time   CHOL 184 08/29/2017 0852   TRIG 172.0 (H) 08/29/2017 0852   HDL 58.60 08/29/2017 0852   CHOLHDL 3 08/29/2017 0852   VLDL 34.4 08/29/2017 0852   LDLCALC 91 08/29/2017 0852    CBC    Component Value Date/Time   WBC 7.1 08/29/2017 0852   RBC 4.50 08/29/2017 0852   HGB 13.1 08/29/2017 0852   HCT 40.8 08/29/2017 0852   PLT 158.0 08/29/2017 0852   MCV 90.7 08/29/2017 0852   MCHC 32.1 08/29/2017 0852   RDW 15.0 08/29/2017 0852    Hgb A1C Lab Results  Component Value Date   HGBA1C 5.9 08/29/2017            Assessment & Plan:   Fatigue, Trembling, Hypothyroidism, Anxiety and Depression:  ESS score of 14 If labs normal, consider referral to pulmonology for sleep study Will check CBC, CMET, TSH, Free T4, A1C, B12 and Vit D today Encouraged her to drink more fluids Encouraged routine exercise Continue Wellbutrin, Prozac, Hydroxyzine, Trazadone and Levothyroxine for now- will adjust if needed based on labs  Will follow up after labs, return precautions discussed Webb Silversmith, NP

## 2018-04-10 MED ORDER — VITAMIN D (ERGOCALCIFEROL) 1.25 MG (50000 UNIT) PO CAPS: 50000.0000 [IU] | ORAL_CAPSULE | ORAL | 0 refills | Status: DC

## 2018-04-10 NOTE — Addendum Note (Signed)
Addended by: Lurlean Nanny on: 04/10/2018 09:50 AM   Modules accepted: Orders

## 2018-04-19 ENCOUNTER — Other Ambulatory Visit: Payer: Self-pay | Admitting: Internal Medicine

## 2018-04-19 DIAGNOSIS — G4700 Insomnia, unspecified: Secondary | ICD-10-CM

## 2018-06-07 ENCOUNTER — Other Ambulatory Visit: Payer: Self-pay | Admitting: Internal Medicine

## 2018-06-12 ENCOUNTER — Other Ambulatory Visit: Payer: Self-pay | Admitting: Internal Medicine

## 2018-06-23 ENCOUNTER — Other Ambulatory Visit: Payer: Self-pay | Admitting: Internal Medicine

## 2018-06-27 ENCOUNTER — Other Ambulatory Visit: Payer: Self-pay | Admitting: Internal Medicine

## 2018-06-27 DIAGNOSIS — K219 Gastro-esophageal reflux disease without esophagitis: Secondary | ICD-10-CM

## 2018-06-30 ENCOUNTER — Other Ambulatory Visit: Payer: Self-pay | Admitting: Internal Medicine

## 2018-07-09 DIAGNOSIS — K080 Exfoliation of teeth due to systemic causes: Secondary | ICD-10-CM | POA: Diagnosis not present

## 2018-07-10 DIAGNOSIS — K080 Exfoliation of teeth due to systemic causes: Secondary | ICD-10-CM | POA: Diagnosis not present

## 2018-07-18 ENCOUNTER — Telehealth: Payer: Self-pay

## 2018-07-18 NOTE — Telephone Encounter (Signed)
Anne Mcguire at front desk brought my chart appt to my attention; pt scheduled 15' my chart appt on 07/21/18 because pt is extremely fatigued, falling asleep at work and while driving in afternoon. I spoke with pt and she said this has been going on for months; pt last seen 03/2018, but has worsened and intensified recently. Pt said she can actively be doing something such as sitting at work, talking on phone or driving the car and pt will go to sleep. Pt also noted she has gained a lot of weight and is concerned about sleep apnea. Pt does not want to go to ED. I changed pts appt on 07/21/18 to 30' appt at 8:45 but I did explain to pt if her symptoms worsen or she develops h/a, dizziness, vision changes pt should go to ED. I advised pt not to drive until evaluated. Pt said she would have to drive home and I advised pt again she should get someone to drive her home because of possibility of pt falling asleep while driving. Pt voiced understanding.

## 2018-07-19 NOTE — Telephone Encounter (Signed)
noted 

## 2018-07-21 ENCOUNTER — Encounter: Payer: Self-pay | Admitting: Internal Medicine

## 2018-07-21 ENCOUNTER — Ambulatory Visit: Payer: Federal, State, Local not specified - PPO | Admitting: Internal Medicine

## 2018-07-21 VITALS — BP 126/84 | HR 70 | Temp 97.9°F | Wt 232.0 lb

## 2018-07-21 DIAGNOSIS — G4710 Hypersomnia, unspecified: Secondary | ICD-10-CM

## 2018-07-21 DIAGNOSIS — R5383 Other fatigue: Secondary | ICD-10-CM

## 2018-07-21 NOTE — Progress Notes (Signed)
Subjective:    Patient ID: Anne Mcguire, female    DOB: [birth year], 47 y.o.   MRN: [id number]  HPI  Pt presents to the clinic today with persistent fatigue. This has been going on for 9 months. She sleeps about 3-4 hours per night, but often woken up by her dog, or the need to urinate. She is having involuntary muscle movements at night and finds herself waking up gasping for air. She does not snore that she is aware of. She does not feel rested when she wakes up. She reports it has gotten so bad, that she is falling asleep in the middle of activities, even driving. Recent labs were essentially unremarkable.  Review of Systems      Past Medical History:  Diagnosis Date  . Anxiety   . Borderline hypertension    situational--  anxiety  . Depression   . GERD (gastroesophageal reflux disease)   . History of peptic ulcer    several years ago  . Hyperlipidemia   . Hypertension   . Hypothyroidism   . Menorrhagia   . Migraines   . Nocturia   . Wears glasses     Current Outpatient Medications  Medication Sig Dispense Refill  . aspirin-acetaminophen-caffeine (EXCEDRIN MIGRAINE) 250-250-65 MG tablet Take by mouth every 6 (six) hours as needed for headache.    Marland Kitchen buPROPion (WELLBUTRIN XL) 150 MG 24 hr tablet TAKE 1 TABLET BY MOUTH EVERY DAY 90 tablet 0  . FLUoxetine (PROZAC) 40 MG capsule TAKE 1 CAPSULE (40 MG TOTAL) BY MOUTH 2 (TWO) TIMES DAILY. 180 capsule 1  . fluticasone (FLONASE) 50 MCG/ACT nasal spray Place 2 sprays into both nostrils as needed.     . hydrOXYzine (ATARAX/VISTARIL) 10 MG tablet Take 1 tablet (10 mg total) by mouth daily as needed. 30 tablet 0  . levothyroxine (SYNTHROID, LEVOTHROID) 100 MCG tablet Take 1 tablet (100 mcg total) by mouth daily before breakfast. MUST SCHEDULE ANNUAL PHYSICAL 90 tablet 0  . lisinopril (PRINIVIL,ZESTRIL) 20 MG tablet Take 1 tablet (20 mg total) by mouth daily. MUST SCHEDULE ANNUAL PHYSICAL 90 tablet 0  . omeprazole (PRILOSEC) 20 MG  capsule Take 1 capsule (20 mg total) by mouth daily. MUST SCHEDULE ANNUAL PHYSICAL EXAM 90 capsule 0  . ondansetron (ZOFRAN ODT) 4 MG disintegrating tablet Take 1 tablet (4 mg total) by mouth every 8 (eight) hours as needed for nausea or vomiting. 20 tablet 0  . traZODone (DESYREL) 50 MG tablet TAKE 0.5-1 TABLETS (25-50 MG TOTAL) BY MOUTH AT BEDTIME AS NEEDED FOR SLEEP 90 tablet 0  . Vitamin D, Ergocalciferol, (DRISDOL) 50000 units CAPS capsule Take 1 capsule (50,000 Units total) by mouth every 7 (seven) days. 12 capsule 0   No current facility-administered medications for this visit.     Allergies  Allergen Reactions  . Azithromycin Hives  . Eggs Or Egg-Derived Products Itching and Rash    THROAT ITCH    Family History  Problem Relation Age of Onset  . Mental illness Mother   . Arthritis Father   . Hyperlipidemia Father   . Hypertension Father   . Diabetes Father   . Arthritis Maternal Grandmother   . Diabetes Maternal Grandmother   . Arthritis Maternal Grandfather   . Diabetes Maternal Grandfather   . Arthritis Paternal Grandmother   . Breast cancer Paternal Grandmother     Social History   Socioeconomic History  . Marital status: Married    Spouse name: Not on file  .  Number of children: Not on file  . Years of education: Not on file  . Highest education level: Not on file  Occupational History  . Not on file  Social Needs  . Financial resource strain: Not on file  . Food insecurity:    Worry: Not on file    Inability: Not on file  . Transportation needs:    Medical: Not on file    Non-medical: Not on file  Tobacco Use  . Smoking status: Never Smoker  . Smokeless tobacco: Never Used  Substance and Sexual Activity  . Alcohol use: Yes    Comment: occasional  . Drug use: No  . Sexual activity: Yes    Birth control/protection: IUD  Lifestyle  . Physical activity:    Days per week: Not on file    Minutes per session: Not on file  . Stress: Not on file    Relationships  . Social connections:    Talks on phone: Not on file    Gets together: Not on file    Attends religious service: Not on file    Active member of club or organization: Not on file    Attends meetings of clubs or organizations: Not on file    Relationship status: Not on file  . Intimate partner violence:    Fear of current or ex partner: Not on file    Emotionally abused: Not on file    Physically abused: Not on file    Forced sexual activity: Not on file  Other Topics Concern  . Not on file  Social History Narrative  . Not on file     Constitutional: Pt reports fatigue. Denies fever, malaise, headache or abrupt weight changes.  Respiratory: Denies difficulty breathing, shortness of breath, cough or sputum production.   Cardiovascular: Denies chest pain, chest tightness, palpitations or swelling in the hands or feet.  Musculoskeletal: Pt reports restless legs. Denies decrease in range of motion, difficulty with gait, or joint pain and swelling.  Skin: Denies redness, rashes, lesions or ulcercations.  Neurological: Denies dizziness, difficulty with memory, difficulty with speech or problems with balance and coordination.  Psych: Pt has a history of anxiety and depression. Denies SI/HI.  No other specific complaints in a complete review of systems (except as listed in HPI above).  Objective:   Physical Exam    BP 126/84   Pulse 70   Temp 97.9 F (36.6 C) (Oral)   Wt 232 lb (105.2 kg)   SpO2 97%   BMI 39.82 kg/m  Wt Readings from Last 3 Encounters:  07/21/18 232 lb (105.2 kg)  04/07/18 229 lb (103.9 kg)  12/12/17 229 lb 12 oz (104.2 kg)    General: Appears her stated age, obese in NAD. Cardiovascular: Normal rate and rhythm. S1,S2 noted.  No murmur, rubs or gallops noted.  Pulmonary/Chest: Normal effort and positive vesicular breath sounds. No respiratory distress. No wheezes, rales or ronchi noted.  Neurological: Alert and oriented. Cranial nerves  II-XII grossly intact.  Psychiatric: Mood and affect normal. Behavior is normal. Judgment and thought content normal.     BMET    Component Value Date/Time   NA 140 04/07/2018 0828   K 4.2 04/07/2018 0828   CL 103 04/07/2018 0828   CO2 30 04/07/2018 0828   GLUCOSE 106 (H) 04/07/2018 0828   BUN 11 04/07/2018 0828   CREATININE 0.69 04/07/2018 0828   CALCIUM 9.0 04/07/2018 0828    Lipid Panel     Component  Value Date/Time   CHOL 184 08/29/2017 0852   TRIG 172.0 (H) 08/29/2017 0852   HDL 58.60 08/29/2017 0852   CHOLHDL 3 08/29/2017 0852   VLDL 34.4 08/29/2017 0852   LDLCALC 91 08/29/2017 0852    CBC    Component Value Date/Time   WBC 6.0 04/07/2018 0828   RBC 4.43 04/07/2018 0828   HGB 13.4 04/07/2018 0828   HCT 40.2 04/07/2018 0828   PLT 157.0 04/07/2018 0828   MCV 90.7 04/07/2018 0828   MCHC 33.3 04/07/2018 0828   RDW 15.0 04/07/2018 0828    Hgb A1C Lab Results  Component Value Date   HGBA1C 5.9 04/07/2018          Assessment & Plan:   Fatigue, Hypersomnia:  ESS of 21 Neck measurement of 17 inches Referral to pulmonology for sleep study  Return precautions discussed Webb Silversmith, NP

## 2018-07-21 NOTE — Patient Instructions (Signed)

## 2018-07-22 ENCOUNTER — Other Ambulatory Visit: Payer: Self-pay | Admitting: Internal Medicine

## 2018-07-22 DIAGNOSIS — G4700 Insomnia, unspecified: Secondary | ICD-10-CM

## 2018-07-24 ENCOUNTER — Ambulatory Visit: Payer: Federal, State, Local not specified - PPO | Admitting: Internal Medicine

## 2018-07-24 ENCOUNTER — Encounter: Payer: Self-pay | Admitting: Internal Medicine

## 2018-07-24 VITALS — BP 136/88 | HR 88 | Ht 64.0 in | Wt 237.0 lb

## 2018-07-24 DIAGNOSIS — G4719 Other hypersomnia: Secondary | ICD-10-CM

## 2018-07-24 NOTE — Patient Instructions (Signed)
Patient needs sleep study to test for sleep apnea

## 2018-07-24 NOTE — Progress Notes (Signed)
Name: Anne Mcguire MRN: [id number] DOB: 18-Jan-1971     CONSULTATION DATE: 8.15.19 REFERRING MD : baity  CHIEF COMPLAINT: fatigue  STUDIES:  No CXR or CT scans in chart  HISTORY OF PRESENT ILLNESS: 47 year old pleasant white female seen today for excessive fatigue and daytime sleepiness In the setting of obesity and deconditioned state Patient currently weighs 237 pounds but has gained significant amount of weight over the last year  She has no signs of infection at this time No signs of heart failure at this time  Patient has been having excessive daytime sleepiness for many years Patient has been having extreme fatigue and tiredness, lack of energy for many years +  very Loud snoring every night + struggling breathe at night and gasps for air   Patient is non-smoker nonalcoholic 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  has a past surgical history that includes Wisdom tooth extraction (2013); Dilatation & currettage/hysteroscopy with novasure ablation (N/A, 01/10/2017); Laparoscopic tubal ligation (Bilateral, 01/10/2017); and Cataract extraction, bilateral (07/11/2017). Prior to Admission medications   Medication Sig Start Date End Date Taking? Authorizing Provider  aspirin-acetaminophen-caffeine (EXCEDRIN MIGRAINE) [phone] MG tablet Take by mouth every 6 (six) hours as needed for headache.    [provider]  buPROPion (WELLBUTRIN XL) 150 MG 24 hr tablet TAKE 1 TABLET BY MOUTH EVERY DAY 06/09/18   Jearld Fenton, NP  FLUoxetine (PROZAC) 40 MG capsule TAKE 1 CAPSULE (40 MG TOTAL) BY MOUTH 2 (TWO) TIMES DAILY. 06/13/18   Jearld Fenton, NP  fluticasone (FLONASE) 50  MCG/ACT nasal spray Place 2 sprays into both nostrils as needed.     [provider]  hydrOXYzine (ATARAX/VISTARIL) 10 MG tablet Take 1 tablet (10 mg total) by mouth daily as needed. 12/12/17   Jearld Fenton, NP  levothyroxine (SYNTHROID, LEVOTHROID) 100 MCG tablet Take 1 tablet (100 mcg total) by mouth daily before breakfast. MUST SCHEDULE ANNUAL PHYSICAL 06/25/18   Jearld Fenton, NP  lisinopril (PRINIVIL,ZESTRIL) 20 MG tablet Take 1 tablet (20 mg total) by mouth daily. MUST SCHEDULE ANNUAL PHYSICAL 06/25/18   Jearld Fenton, NP  omeprazole (PRILOSEC) 20 MG capsule Take 1 capsule (20 mg total) by mouth daily. MUST SCHEDULE ANNUAL PHYSICAL EXAM 06/27/18   Jearld Fenton, NP  ondansetron (ZOFRAN ODT) 4 MG disintegrating tablet Take 1 tablet (4 mg total) by mouth every 8 (eight) hours as needed for nausea or vomiting. 04/04/17   Pleas Koch, NP  traZODone (DESYREL) 50 MG tablet TAKE 0.5-1 TABLETS (25-50 MG TOTAL) BY MOUTH AT BEDTIME AS NEEDED FOR SLEEP 07/23/18   Jearld Fenton, NP  Vitamin D, Ergocalciferol, (DRISDOL) 50000 units CAPS capsule Take 1 capsule (50,000 Units total) by mouth every 7 (seven) days. 04/10/18   Jearld Fenton, NP   Allergies  Allergen Reactions  . Azithromycin Hives  . Eggs Or Egg-Derived Products Itching and Rash    THROAT ITCH    FAMILY HISTORY:  family history includes Arthritis in her father, maternal grandfather, maternal grandmother, and paternal grandmother; Breast cancer in her paternal grandmother; Diabetes in her father, maternal grandfather, and maternal grandmother; Hyperlipidemia in her father; Hypertension in her father; Mental illness in her mother. SOCIAL HISTORY:  reports that she  has never smoked. She has never used smokeless tobacco. She reports that she drinks alcohol. She reports that she does not use drugs.  REVIEW OF SYSTEMS:   Constitutional:+ malaise/fatigue  HENT: Negative for hearing loss, ear pain, nosebleeds, congestion, sore  throat, neck pain, tinnitus and ear discharge.   Eyes: Negative for blurred vision, double vision, photophobia, pain, discharge and redness.  Respiratory: Negative for cough, hemoptysis, sputum production, shortness of breath, wheezing and stridor.   Cardiovascular: Negative for chest pain, palpitations, orthopnea, claudication, leg swelling and PND.  Gastrointestinal: Negative for heartburn, nausea, vomiting, abdominal pain, diarrhea, constipation, blood in stool and melena.  Genitourinary: Negative for dysuria, urgency, frequency, hematuria and flank pain.  Musculoskeletal: Negative for myalgias, back pain, joint pain and falls.  Skin: Negative for itching and rash.  Neurological: Negative for dizziness, tingling, tremors, sensory change, speech change, focal weakness, seizures, loss of consciousness, weakness and headaches.  Endo/Heme/Allergies: Negative for environmental allergies and polydipsia. Does not bruise/bleed easily.  ALL OTHER ROS ARE NEGATIVE    VITAL SIGNS: @VSRANGES @  Physical Examination:   GENERAL:NAD, no fevers, chills, + fatigue HEAD: Normocephalic, atraumatic.  EYES: Pupils equal, round, reactive to light. Extraocular muscles intact. No scleral icterus.  MOUTH: Moist mucosal membrane.   EAR, NOSE, THROAT: Clear without exudates. No external lesions.  NECK: Supple. No thyromegaly. No nodules. No JVD.  PULMONARY:CTA B/L no wheezes, no crackles, no rhonchi CARDIOVASCULAR: S1 and S2. Regular rate and rhythm. No murmurs, rubs, or gallops. No edema.  GASTROINTESTINAL: Soft, nontender, nondistended. No masses. Positive bowel sounds.  MUSCULOSKELETAL: No swelling, clubbing, or edema. Range of motion full in all extremities.  NEUROLOGIC: Cranial nerves II through XII are intact. No gross focal neurological deficits.  SKIN: No ulceration, lesions, rashes, or cyanosis. Skin warm and dry. Turgor intact.  PSYCHIATRIC: Mood, affect within normal limits. The patient is awake,  alert and oriented x 3. Insight, judgment intact.      ASSESSMENT / PLAN: 47 year old pleasant white female with signs and symptoms of excessive daytime sleepiness fatigue snoring and shortness of breath at night most likely related to underlying sleep apnea in the setting of obesity and deconditioned state  #1 patient needs sleep study ASAP  #2 obesity -recommend significant weight loss -recommend changing diet  #3 deconditioned state -Recommend increased daily activity and exercise     Patient  satisfied with Plan of action and management. All questions answered Follow-up after test completed   Corrin Parker, M.D.  [facility]  Medical Director [place] Director [facility] Cardio-Pulmonary Department

## 2018-08-02 ENCOUNTER — Other Ambulatory Visit: Payer: Self-pay | Admitting: Internal Medicine

## 2018-08-13 ENCOUNTER — Ambulatory Visit: Payer: Federal, State, Local not specified - PPO | Attending: Internal Medicine

## 2018-08-13 DIAGNOSIS — G4733 Obstructive sleep apnea (adult) (pediatric): Secondary | ICD-10-CM | POA: Insufficient documentation

## 2018-08-13 DIAGNOSIS — G4710 Hypersomnia, unspecified: Secondary | ICD-10-CM | POA: Diagnosis not present

## 2018-08-19 ENCOUNTER — Telehealth: Payer: Self-pay | Admitting: *Deleted

## 2018-08-19 DIAGNOSIS — G4733 Obstructive sleep apnea (adult) (pediatric): Secondary | ICD-10-CM

## 2018-08-19 NOTE — Telephone Encounter (Signed)
Pt aware of results of sleep study. Orders placed  Nothing further needed.

## 2018-09-01 DIAGNOSIS — Z01419 Encounter for gynecological examination (general) (routine) without abnormal findings: Secondary | ICD-10-CM | POA: Diagnosis not present

## 2018-09-01 DIAGNOSIS — Z6841 Body Mass Index (BMI) 40.0 and over, adult: Secondary | ICD-10-CM | POA: Diagnosis not present

## 2018-09-01 DIAGNOSIS — Z1231 Encounter for screening mammogram for malignant neoplasm of breast: Secondary | ICD-10-CM | POA: Diagnosis not present

## 2018-09-02 ENCOUNTER — Other Ambulatory Visit: Payer: Self-pay

## 2018-09-02 ENCOUNTER — Other Ambulatory Visit: Payer: Self-pay | Admitting: Obstetrics & Gynecology

## 2018-09-02 DIAGNOSIS — R928 Other abnormal and inconclusive findings on diagnostic imaging of breast: Secondary | ICD-10-CM

## 2018-09-03 ENCOUNTER — Ambulatory Visit
Admission: RE | Admit: 2018-09-03 | Discharge: 2018-09-03 | Disposition: A | Discharge status: Home / Self Care | Payer: Federal, State, Local not specified - PPO | Source: Ambulatory Visit | Attending: Obstetrics & Gynecology | Admitting: Obstetrics & Gynecology

## 2018-09-03 DIAGNOSIS — R928 Other abnormal and inconclusive findings on diagnostic imaging of breast: Secondary | ICD-10-CM

## 2018-09-03 DIAGNOSIS — N6011 Diffuse cystic mastopathy of right breast: Secondary | ICD-10-CM | POA: Diagnosis not present

## 2018-09-03 DIAGNOSIS — N6012 Diffuse cystic mastopathy of left breast: Secondary | ICD-10-CM | POA: Diagnosis not present

## 2018-09-18 ENCOUNTER — Other Ambulatory Visit: Payer: Self-pay | Admitting: Internal Medicine

## 2018-09-18 DIAGNOSIS — K219 Gastro-esophageal reflux disease without esophagitis: Secondary | ICD-10-CM

## 2018-10-17 DIAGNOSIS — Z23 Encounter for immunization: Secondary | ICD-10-CM | POA: Diagnosis not present

## 2018-10-20 ENCOUNTER — Ambulatory Visit: Payer: Federal, State, Local not specified - PPO | Admitting: Podiatry

## 2018-10-20 ENCOUNTER — Ambulatory Visit (INDEPENDENT_AMBULATORY_CARE_PROVIDER_SITE_OTHER): Payer: Federal, State, Local not specified - PPO

## 2018-10-20 ENCOUNTER — Encounter: Payer: Self-pay | Admitting: Podiatry

## 2018-10-20 VITALS — BP 132/88 | HR 91

## 2018-10-20 DIAGNOSIS — M7750 Other enthesopathy of unspecified foot: Secondary | ICD-10-CM

## 2018-10-20 DIAGNOSIS — M7752 Other enthesopathy of left foot: Secondary | ICD-10-CM | POA: Diagnosis not present

## 2018-10-20 DIAGNOSIS — M7751 Other enthesopathy of right foot: Secondary | ICD-10-CM | POA: Diagnosis not present

## 2018-10-20 DIAGNOSIS — M722 Plantar fascial fibromatosis: Secondary | ICD-10-CM

## 2018-10-20 MED ORDER — MELOXICAM 15 MG PO TABS: 15.0000 mg | ORAL_TABLET | Freq: Every day | ORAL | 1 refills | Status: AC

## 2018-10-20 MED ORDER — METHYLPREDNISOLONE 4 MG PO TBPK: ORAL_TABLET | ORAL | 0 refills | Status: DC

## 2018-10-20 NOTE — Progress Notes (Signed)
Orthotic note:  Semi-rigid, with 3* medial skive RF w/ 2* valgus ff posting.  [place] to fab (patient is RF valgus b/l) Dr E to drop charges [phone].Marland KitchenMarland KitchenMarland KitchenLiliane Channel

## 2018-10-21 NOTE — Progress Notes (Signed)
   Subjective: 47 year old female presenting today as a new patient with a chief complaint of throbbing pain to the posterior heel bilaterally that began suddenly two months ago. Walking increases the pain. Resting and staying off of her feet help alleviate the pain which she has been trying to do for treatment. She reports being diagnosed with plantar fasciitis in the past. Patient is here for further evaluation and treatment.   Past Medical History:  Diagnosis Date  . Anxiety   . Borderline hypertension    situational--  anxiety  . Depression   . GERD (gastroesophageal reflux disease)   . History of peptic ulcer    several years ago  . Hyperlipidemia   . Hypertension   . Hypothyroidism   . Menorrhagia   . Migraines   . Nocturia   . Wears glasses      Objective: Physical Exam General: The patient is alert and oriented x3 in no acute distress.  Dermatology: Skin is warm, dry and supple bilateral lower extremities. Negative for open lesions or macerations bilateral.   Vascular: Dorsalis Pedis and Posterior Tibial pulses palpable bilateral.  Capillary fill time is immediate to all digits.  Neurological: Epicritic and protective threshold intact bilateral.   Musculoskeletal: Tenderness to palpation to the plantar aspect of the bilateral heels along the plantar fascia. All other joints range of motion within normal limits bilateral. Strength 5/5 in all groups bilateral.   Radiographic exam: Normal osseous mineralization. Joint spaces preserved. No fracture/dislocation/boney destruction. No other soft tissue abnormalities or radiopaque foreign bodies.   Assessment: 1. plantar fasciitis right 2. Plantar fasciitis left - lateral band   Plan of Care:  1. Patient evaluated. Xrays reviewed.   2. Injection of 0.5cc Celestone soluspan injected into the left heel.  3. Rx for Medrol Dose Pak placed 4. Rx for Meloxicam ordered for patient. 5. Plantar fascial band(s) dispensed for  bilateral plantar fasciitis. 6. Instructed patient regarding therapies and modalities at home to alleviate symptoms.  7. Appointment with Liliane Channel, Pedorthist, for custom molded orthotics.  8. Return to clinic in 4 weeks.    Edrick Kins, DPM [facility]  Dr. Edrick Kins, DPM    [address], [place] [address]                Office [phone]  Fax [phone]

## 2018-10-30 ENCOUNTER — Ambulatory Visit: Payer: Federal, State, Local not specified - PPO | Admitting: Internal Medicine

## 2018-10-30 ENCOUNTER — Encounter: Payer: Self-pay | Admitting: Internal Medicine

## 2018-10-30 VITALS — BP 134/82 | HR 86 | Temp 98.5°F | Wt 240.0 lb

## 2018-10-30 DIAGNOSIS — J302 Other seasonal allergic rhinitis: Secondary | ICD-10-CM

## 2018-10-30 NOTE — Progress Notes (Signed)
HPI  Pt presents to the clinic today with c/o runny nose, ear fullness, sore throat and cough. She reports this started 4 days ago. The cough is productive of clear mucous. She denies runny nose, nasal congestion, ear pain, sore throat or shortness of breath. She denies fever, chills or body aches. She has tried Mucinex OTC and reports overall she feels better. She has no history of allergies or breathing problems. She has not had sick contacts that she is aware of.   Review of Systems      Past Medical History:  Diagnosis Date  . Anxiety   . Borderline hypertension    situational--  anxiety  . Depression   . GERD (gastroesophageal reflux disease)   . History of peptic ulcer    several years ago  . Hyperlipidemia   . Hypertension   . Hypothyroidism   . Menorrhagia   . Migraines   . Nocturia   . Wears glasses     Family History  Problem Relation Age of Onset  . Mental illness Mother   . Arthritis Father   . Hyperlipidemia Father   . Hypertension Father   . Diabetes Father   . Arthritis Maternal Grandmother   . Diabetes Maternal Grandmother   . Arthritis Maternal Grandfather   . Diabetes Maternal Grandfather   . Arthritis Paternal Grandmother   . Breast cancer Paternal Grandmother     Social History   Socioeconomic History  . Marital status: Married    Spouse name: Not on file  . Number of children: Not on file  . Years of education: Not on file  . Highest education level: Not on file  Occupational History  . Not on file  Social Needs  . Financial resource strain: Not on file  . Food insecurity:    Worry: Not on file    Inability: Not on file  . Transportation needs:    Medical: Not on file    Non-medical: Not on file  Tobacco Use  . Smoking status: Never Smoker  . Smokeless tobacco: Never Used  Substance and Sexual Activity  . Alcohol use: Yes    Comment: occasional  . Drug use: No  . Sexual activity: Yes    Birth control/protection: IUD  Lifestyle   . Physical activity:    Days per week: Not on file    Minutes per session: Not on file  . Stress: Not on file  Relationships  . Social connections:    Talks on phone: Not on file    Gets together: Not on file    Attends religious service: Not on file    Active member of club or organization: Not on file    Attends meetings of clubs or organizations: Not on file    Relationship status: Not on file  . Intimate partner violence:    Fear of current or ex partner: Not on file    Emotionally abused: Not on file    Physically abused: Not on file    Forced sexual activity: Not on file  Other Topics Concern  . Not on file  Social History Narrative  . Not on file    Allergies  Allergen Reactions  . Azithromycin Hives  . Eggs Or Egg-Derived Products Itching and Rash    THROAT ITCH     Constitutional: Denies headache, fatigue, fever or abrupt weight changes.  HEENT:  Pt reports runny nose, ear fullness, sore throat. Denies eye redness, eye pain, pressure behind the eyes,  facial pain, nasal congestion, ear pain, ringing in the ears, wax buildup. Respiratory: Positive cough. Denies difficulty breathing or shortness of breath.  Cardiovascular: Denies chest pain, chest tightness, palpitations or swelling in the hands or feet.   No other specific complaints in a complete review of systems (except as listed in HPI above).  Objective:   BP 134/82   Pulse 86   Temp 98.5 F (36.9 C) (Oral)   Wt 240 lb (108.9 kg)   SpO2 98%   BMI 41.20 kg/m   Wt Readings from Last 3 Encounters:  10/30/18 240 lb (108.9 kg)  07/24/18 237 lb (107.5 kg)  07/21/18 232 lb (105.2 kg)     General: Appears her stated age, well developed, well nourished in NAD. HEENT: Head: normal shape and size, no sinus tenderness noted; Ears: Tm's gray and intact, normal light reflex; Nose: mucosa pink and moist, septum midline; Throat/Mouth: + PND. Teeth present, mucosa pink and moist, no exudate noted, no lesions or  ulcerations noted.  Neck: No cervical lymphadenopathy.  Cardiovascular: Normal rate and rhythm. S1,S2 noted.  No murmur, rubs or gallops noted.  Pulmonary/Chest: Normal effort and positive vesicular breath sounds. No respiratory distress. No wheezes, rales or ronchi noted.       Assessment & Plan:   Allergic Rhinitis:  Get some rest and drink plenty of water Do salt water gargles for the sore throat Start Flonase and Zyrtec OTC Delsym OTC as needed for cough Work note provided  RTC as needed or if symptoms persist.   Webb Silversmith, NP

## 2018-10-30 NOTE — Patient Instructions (Signed)

## 2018-11-17 ENCOUNTER — Ambulatory Visit: Payer: Federal, State, Local not specified - PPO | Admitting: Orthotics

## 2018-11-17 ENCOUNTER — Ambulatory Visit (INDEPENDENT_AMBULATORY_CARE_PROVIDER_SITE_OTHER): Payer: Federal, State, Local not specified - PPO | Admitting: Podiatry

## 2018-11-17 DIAGNOSIS — M7672 Peroneal tendinitis, left leg: Secondary | ICD-10-CM

## 2018-11-17 DIAGNOSIS — M722 Plantar fascial fibromatosis: Secondary | ICD-10-CM

## 2018-11-17 NOTE — Progress Notes (Signed)
Patient came in today to pick up custom made foot orthotics.  The goals were accomplished and the patient reported no dissatisfaction with said orthotics.  Patient was advised of breakin period and how to report any issues. 

## 2018-11-19 NOTE — Progress Notes (Signed)
   Subjective: 47 year old female presenting today for follow up evaluation of bilateral foot pain. She states the pain in the right foot has resolved. She reports some continued pain in the left foot and states the injection helped to alleviate it last time. Walking increases the pain. She has been taking Meloxicam for treatment. Patient is here for further evaluation and treatment.   Past Medical History:  Diagnosis Date  . Anxiety   . Borderline hypertension    situational--  anxiety  . Depression   . GERD (gastroesophageal reflux disease)   . History of peptic ulcer    several years ago  . Hyperlipidemia   . Hypertension   . Hypothyroidism   . Menorrhagia   . Migraines   . Nocturia   . Wears glasses      Objective: Physical Exam General: The patient is alert and oriented x3 in no acute distress.  Dermatology: Skin is warm, dry and supple bilateral lower extremities. Negative for open lesions or macerations bilateral.   Vascular: Dorsalis Pedis and Posterior Tibial pulses palpable bilateral.  Capillary fill time is immediate to all digits.  Neurological: Epicritic and protective threshold intact bilateral.   Musculoskeletal: Tenderness to palpation to the plantar aspect of the left heel along the plantar fascia. Pain with palpation to the peroneal tendon of the left foot. All other joints range of motion within normal limits bilateral. Strength 5/5 in all groups bilateral.   Assessment: 1. plantar fasciitis right - resolved  2. Plantar fasciitis left - lateral band  3. Peroneal tendinitis left   Plan of Care:  1. Patient evaluated.   2. Injection of 0.5cc Celestone soluspan injected into the peroneal tendon sheath.  3. Pick up orthotics from [facility] today.  4. Continue taking Meloxicam as needed.  5. Return to clinic as needed.    Edrick Kins, DPM [facility]  Dr. Edrick Kins, DPM    [address], [place] [address]                Office [phone]  Fax [phone]

## 2018-12-14 ENCOUNTER — Other Ambulatory Visit: Payer: Self-pay | Admitting: Internal Medicine

## 2018-12-27 ENCOUNTER — Other Ambulatory Visit: Payer: Self-pay | Admitting: Internal Medicine

## 2018-12-27 DIAGNOSIS — K219 Gastro-esophageal reflux disease without esophagitis: Secondary | ICD-10-CM

## 2019-01-21 DIAGNOSIS — K080 Exfoliation of teeth due to systemic causes: Secondary | ICD-10-CM | POA: Diagnosis not present

## 2019-04-06 ENCOUNTER — Other Ambulatory Visit: Payer: Self-pay | Admitting: Internal Medicine

## 2019-04-06 DIAGNOSIS — K219 Gastro-esophageal reflux disease without esophagitis: Secondary | ICD-10-CM

## 2019-04-09 ENCOUNTER — Other Ambulatory Visit: Payer: Self-pay | Admitting: Internal Medicine

## 2019-05-07 IMAGING — MG DIGITAL DIAGNOSTIC BILATERAL MAMMOGRAM WITH TOMO AND CAD
8 series · 8 of 24 positions shown · non-contrast
Comparison: Previous exam(s).

CLINICAL DATA: Screening recall for a possible left breast mass and
a right breast asymmetry.

EXAM:
DIGITAL DIAGNOSTIC BILATERAL MAMMOGRAM WITH CAD AND TOMO
ULTRASOUND BILATERAL BREAST

[L CC synth-2D]
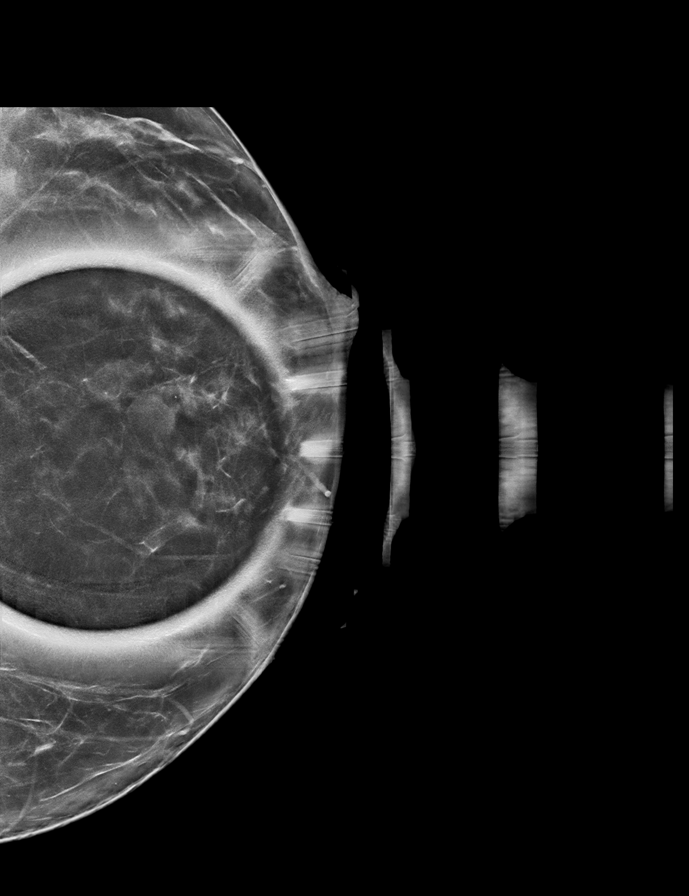

[L ML synth-2D]
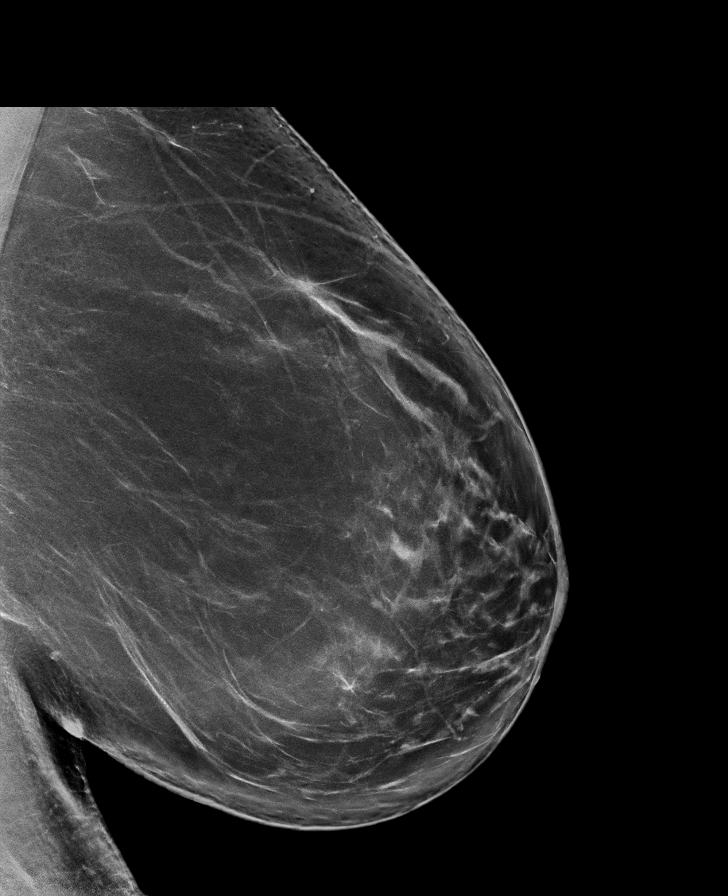

[R CC synth-2D]
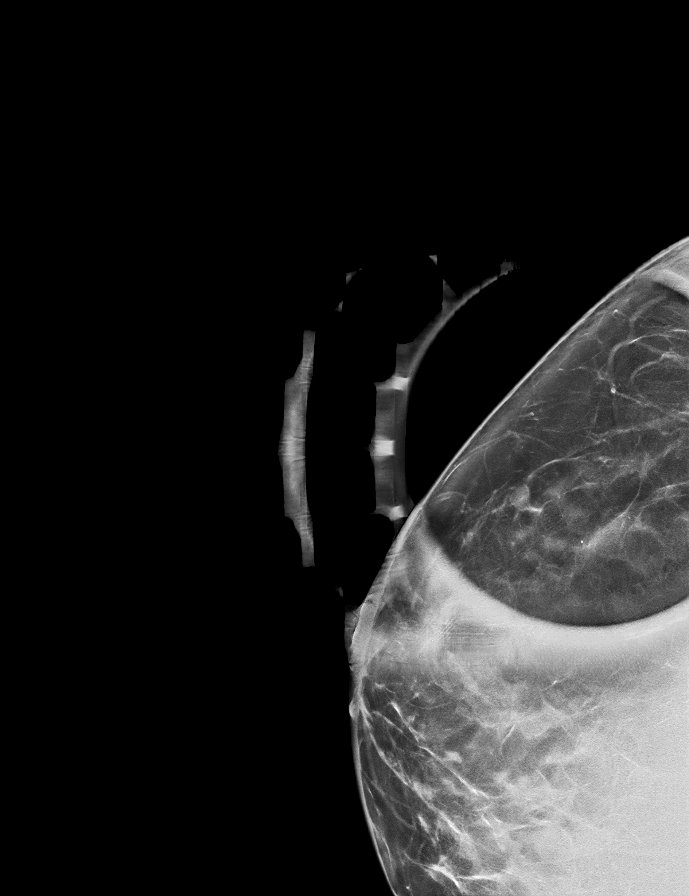

[R MLO synth-2D]
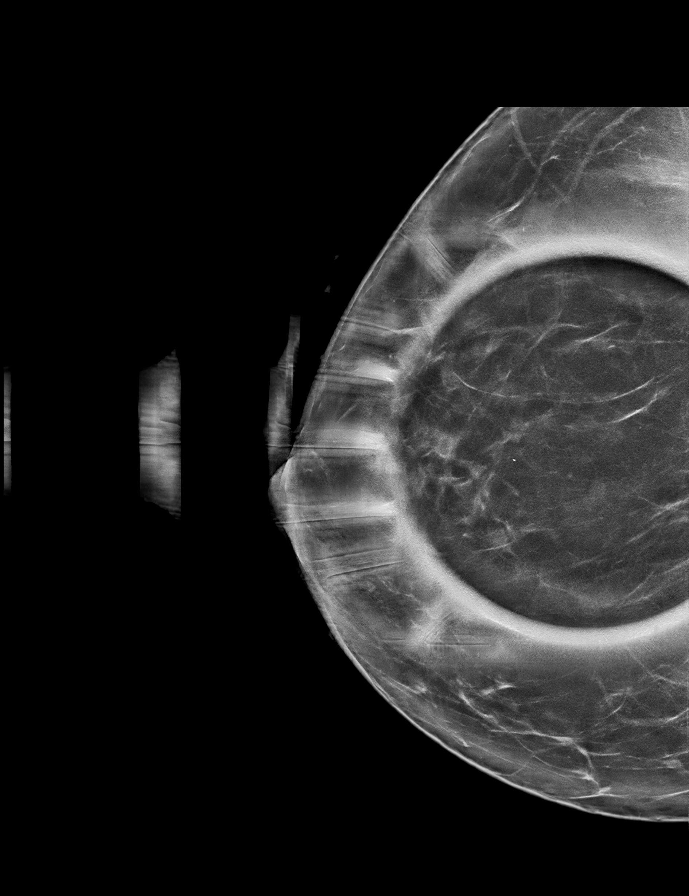

[R MLO tomo · tomo slice 45/90.0]
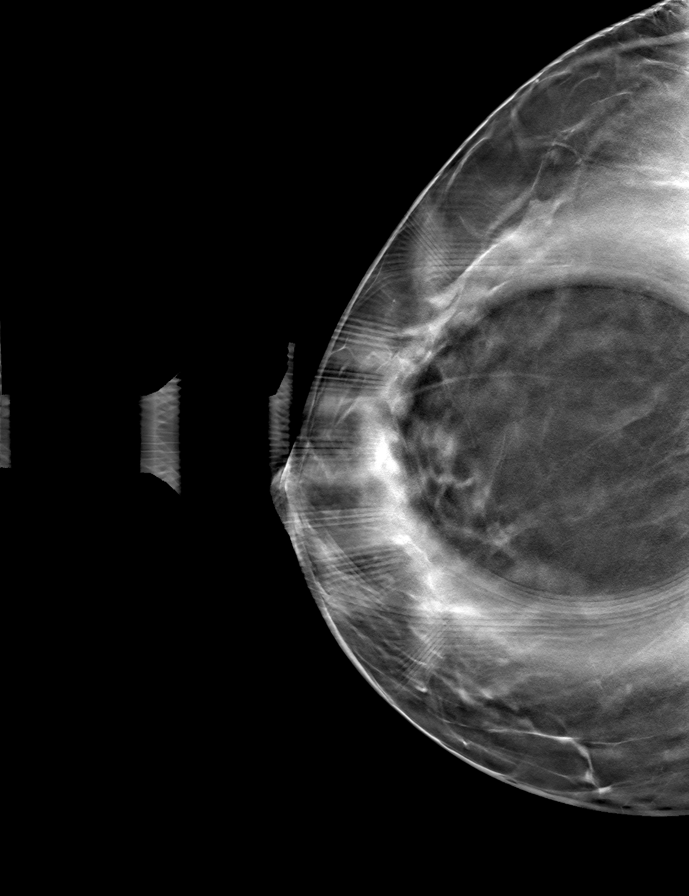

[R CC tomo · tomo slice 35/68.0]
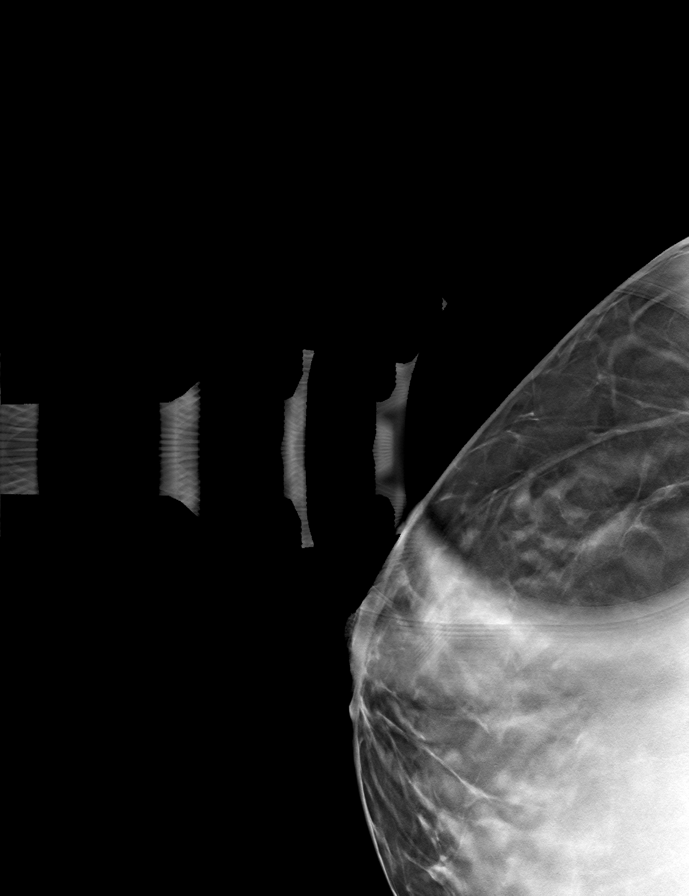

[L CC tomo · tomo slice 39/77.0]
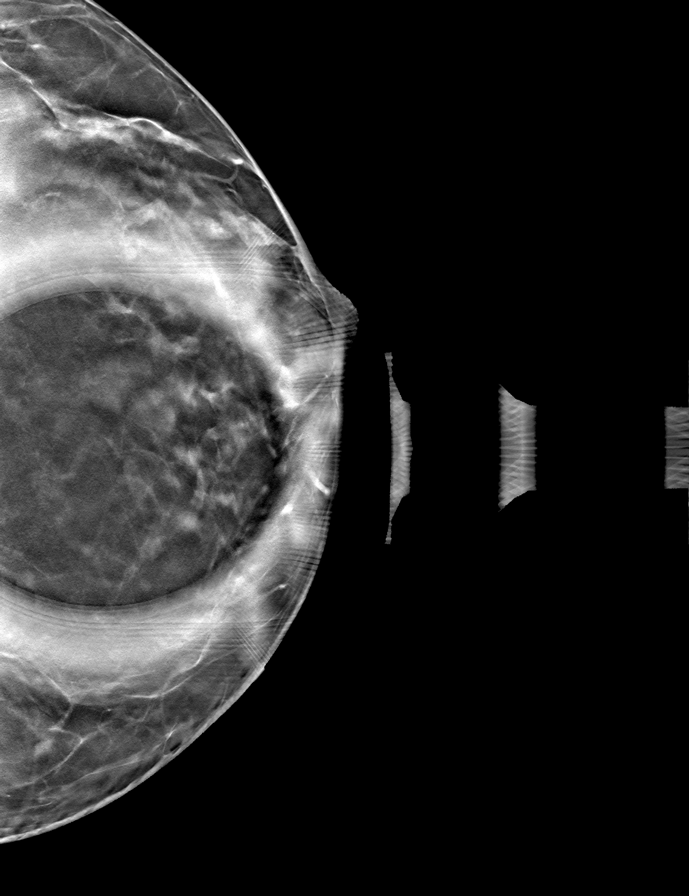

[L ML tomo · tomo slice 51/100.0]
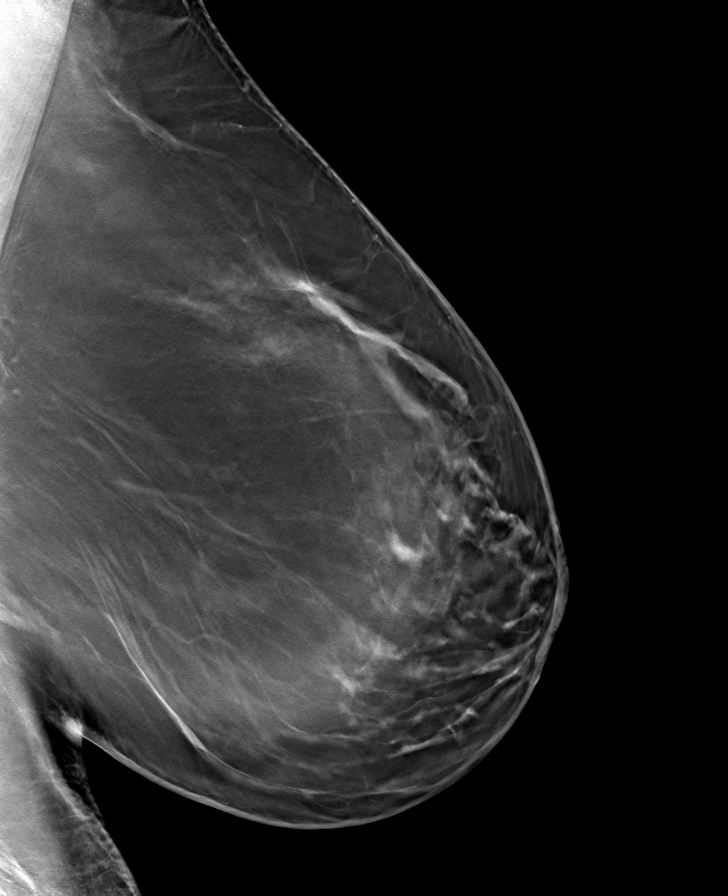

[8 of 24 positions shown; findings below may reference images not displayed]

ACR Breast Density Category b: There are scattered areas of
fibroglandular density.
FINDINGS: In the upper slightly inner left breast, anterior depth there is a
circumscribed low-density oval mass measuring approximately 1.1 cm.
In the lower outer anterior depth of the right breast there is a
smaller oval circumscribed mass measuring approximately 0.6 cm.

Mammographic images were processed with CAD.

Ultrasound of the right breast at 8 o'clock, 4 cm from the nipple
demonstrates an anechoic oval circumscribed mass with posterior
acoustic enhancement measuring 6 x 4 x 6 mm.

Ultrasound of the left breast at 11 o'clock, 3 cm from the nipple
demonstrates a septated oval anechoic circumscribed mass measuring
1.3 x 0.6 x 0.9 cm.
IMPRESSION: 1. The right breast asymmetry in the left breast mass correspond
with benign cysts identified sonographically.

RECOMMENDATION:
Screening mammogram in one year.(Code:[id number])

I have discussed the findings and recommendations with the patient.
Results were also provided in writing at the conclusion of the
visit. If applicable, a reminder letter will be sent to the patient
regarding the next appointment.

BI-RADS CATEGORY  2: Benign.

## 2019-05-14 DIAGNOSIS — N920 Excessive and frequent menstruation with regular cycle: Secondary | ICD-10-CM | POA: Diagnosis not present

## 2019-05-14 DIAGNOSIS — N946 Dysmenorrhea, unspecified: Secondary | ICD-10-CM | POA: Diagnosis not present

## 2019-05-14 DIAGNOSIS — D509 Iron deficiency anemia, unspecified: Secondary | ICD-10-CM | POA: Diagnosis not present

## 2019-06-08 ENCOUNTER — Ambulatory Visit (INDEPENDENT_AMBULATORY_CARE_PROVIDER_SITE_OTHER): Payer: Federal, State, Local not specified - PPO | Admitting: Family Medicine

## 2019-06-08 ENCOUNTER — Encounter: Payer: Self-pay | Admitting: Family Medicine

## 2019-06-08 VITALS — Temp 98.0°F | Wt 234.0 lb

## 2019-06-08 DIAGNOSIS — F411 Generalized anxiety disorder: Secondary | ICD-10-CM | POA: Diagnosis not present

## 2019-06-08 DIAGNOSIS — F332 Major depressive disorder, recurrent severe without psychotic features: Secondary | ICD-10-CM | POA: Diagnosis not present

## 2019-06-08 MED ORDER — BUPROPION HCL ER (SR) 150 MG PO TB12: 150.0000 mg | ORAL_TABLET | Freq: Two times a day (BID) | ORAL | 1 refills | Status: DC

## 2019-06-08 NOTE — Assessment & Plan Note (Signed)
Worsening symptoms. Had previously had wellbutrin as adjunct, but stopped. Will restart. Discussed therapy and patient willing to try. Also discussed psych referral which she is open to if things are not improving. 6 wk f/u with pcp recommended.

## 2019-06-08 NOTE — Patient Instructions (Signed)
Website for therapists  [email or web address]   Also here are some other things that can help with anxiety/depression  How to help anxiety - without medication.   1) Regular Exercise - walking, jogging, cycling, dancing, strength training  2)  Begin a Mindfulness/Meditation practice -- this can take a little as 3 minutes and is helpful for all kinds of mood issues -- You can find resources in books -- Or you can download apps like  ---- Headspace App (which currently has free content called "Weathering the Storm") ---- Calm (which has a few free options)  ---- Insignt Timer ---- Stop, Breathe & Think  # With each of these Apps - you should decline the "start free trial" offer and as you search through the App should be able to access some of their free content. You can also chose to pay for the content if you find one that works well for you.   # Many of them also offer sleep specific content which may help with insomnia  3) Healthy Diet -- Avoid or decrease Caffeine -- Avoid or decrease Alcohol -- Drink plenty of water, have a balanced diet -- Avoid cigarettes and marijuana (as well as other recreational drugs)  4) Consider contacting a professional therapist

## 2019-06-08 NOTE — Progress Notes (Signed)
I connected with Anne Mcguire on 06/08/19 at 12:20 PM EDT by video and verified that I am speaking with the correct person using two identifiers.   I discussed the limitations, risks, security and privacy concerns of performing an evaluation and management service by video and the availability of in person appointments. I also discussed with the patient that there may be a patient responsible charge related to this service. The patient expressed understanding and agreed to proceed.  Patient location: Home Provider Location: [place] Participants: ALANII RAMER and MAKALA FETTEROLF   Subjective:     Anne Mcguire is a 48 y.o. female presenting for Depression/Anxiety (feeling worse. "no motivation.")     HPI   #Depression/Anxiety - currently taking prozac 40 mg - typically taking twice daily  - is taking hydroxyzine for severe symptoms - stopped taking trazodone - stopped taking bupropone- not sure if this helped, ran out of medication and then did not refill or restart - thinks she may have been on the buspirone during that time - depression seems to be worse than anxiety - difficulty with motivation and completing tasks or projects - think this has been worse for 6-8 months - not in therapy - has been in an out of therapy for years, last time was 8 years ago - has considered therapy - but not sure where to go   Review of Systems  Psychiatric/Behavioral: Positive for decreased concentration. The patient is nervous/anxious.     12/12/2017: Clinic - worsening depression - prozac 40 mg BID, wellbutrin 150 mg, and hydroxyzine prn  Social History   Tobacco Use  Smoking Status Never Smoker  Smokeless Tobacco Never Used        Objective:   BP Readings from Last 3 Encounters:  10/30/18 134/82  10/20/18 132/88  07/24/18 136/88   Wt Readings from Last 3 Encounters:  06/08/19 234 lb (106.1 kg)  10/30/18 240 lb (108.9 kg)  07/24/18 237 lb  (107.5 kg)    Temp 98 F (36.7 C) Comment: per patient  Wt 234 lb (106.1 kg) Comment: per patient  BMI 40.17 kg/m    Physical Exam Constitutional:      Appearance: Normal appearance. She is not ill-appearing.  HENT:     Head: Normocephalic and atraumatic.     Right Ear: External ear normal.     Left Ear: External ear normal.  Eyes:     Conjunctiva/sclera: Conjunctivae normal.  Pulmonary:     Effort: Pulmonary effort is normal. No respiratory distress.  Neurological:     Mental Status: She is alert. Mental status is at baseline.  Psychiatric:        Mood and Affect: Mood normal.        Behavior: Behavior normal.        Thought Content: Thought content normal.        Judgment: Judgment normal.       Depression screen [facility] 2/9 06/08/2019 10/30/2018 07/21/2018  Decreased Interest 3 0 0  Down, Depressed, Hopeless 2 0 0  PHQ - 2 Score 5 0 0  Altered sleeping 2 0 -  Tired, decreased energy 3 0 -  Change in appetite 1 0 -  Feeling bad or failure about yourself  3 0 -  Trouble concentrating 3 0 -  Moving slowly or fidgety/restless 0 0 -  Suicidal thoughts 0 0 -  PHQ-9 Score 17 0 -  Difficult doing work/chores Very difficult Not difficult at  all -   GAD 7 : Generalized Anxiety Score 06/08/2019 10/06/2015  Nervous, Anxious, on Edge 3 1  Control/stop worrying 3 1  Worry too much - different things 3 1  Trouble relaxing 3 0  Restless 0 0  Easily annoyed or irritable 1 0  Afraid - awful might happen 1 0  Total GAD 7 Score 14 3  Anxiety Difficulty Very difficult -      Adult ADHD Self Report Scale (most recent)    Adult ADHD Self-Report Scale (ASRS-v1.1) Symptom Checklist - 06/08/19 1212      Part A   1. How often do you have trouble wrapping up the final details of a project, once the challenging parts have been done?  (!) Often  2. How often do you have difficulty getting things done in order when you have to do a task that requires organization?  (!) Very Often    3.  How often do you have problems remembering appointments or obligations?  Never  4. When you have a task that requires a lot of thought, how often do you avoid or delay getting started?  (!) Very Often    5. How often do you fidget or squirm with your hands or feet when you have to sit down for a long time?  Never  6. How often do you feel overly active and compelled to do things, like you were driven by a motor?  Never      Part B   7. How often do you make careless mistakes when you have to work on a boring or difficult project?  Rarely  8. How often do you have difficulty keeping your attention when you are doing boring or repetitive work?  (!) Very Often    9. How often do you have difficulty concentrating on what people say to you, even when they are speaking to you directly?  (!) Sometimes  10. How often do you misplace or have difficulty finding things at home or at work?  Sometimes    11. How often are you distracted by activity or noise around you?  (!) Very Often  12. How often do you leave your seat in meetings or other situations in which you are expected to remain seated?  Never    13. How often do you feel restless or fidgety?  Never  14. How often do you have difficulty unwinding and relaxing when you have time to yourself?  (!) Often    15. How often do you find yourself talking too much when you are in social situations?  Never  16. When you are in a conversation, how often do you find yourself finishing the sentences of the people you are talking to, before they can finish them themselves?  (!) Sometimes    17. How often do you have difficulty waiting your turn in situations when turn taking is required?  (!) Very Often  57. How often do you interrupt others when they are busy?  (!) Sometimes           Assessment & Plan:   Problem List Items Addressed This Visit      Other   Severe episode of recurrent major depressive disorder, without psychotic features ([place])    Worsening  symptoms. Had previously had wellbutrin as adjunct, but stopped. Will restart. Discussed therapy and patient willing to try. Also discussed psych referral which she is open to if things are not improving. 6 wk f/u  with pcp recommended.       Relevant Medications   buPROPion (WELLBUTRIN SR) 150 MG 12 hr tablet   Other Relevant Orders   Ambulatory referral to Psychiatry   Ambulatory referral to Psychology   Generalized anxiety disorder - Primary   Relevant Medications   buPROPion (WELLBUTRIN SR) 150 MG 12 hr tablet   Other Relevant Orders   Ambulatory referral to Psychiatry   Ambulatory referral to Psychology       Return in about 6 weeks (around 07/20/2019) for depression f/u.  Lesleigh Noe, MD

## 2019-06-30 ENCOUNTER — Other Ambulatory Visit: Payer: Self-pay | Admitting: Family Medicine

## 2019-06-30 DIAGNOSIS — F411 Generalized anxiety disorder: Secondary | ICD-10-CM

## 2019-06-30 DIAGNOSIS — F332 Major depressive disorder, recurrent severe without psychotic features: Secondary | ICD-10-CM

## 2019-07-01 ENCOUNTER — Ambulatory Visit: Payer: Federal, State, Local not specified - PPO | Admitting: Psychology

## 2019-07-04 ENCOUNTER — Other Ambulatory Visit: Payer: Self-pay | Admitting: Internal Medicine

## 2019-07-05 ENCOUNTER — Other Ambulatory Visit: Payer: Self-pay | Admitting: Internal Medicine

## 2019-07-05 DIAGNOSIS — K219 Gastro-esophageal reflux disease without esophagitis: Secondary | ICD-10-CM

## 2019-07-14 ENCOUNTER — Other Ambulatory Visit: Payer: Self-pay

## 2019-07-14 ENCOUNTER — Other Ambulatory Visit: Payer: Self-pay | Admitting: Internal Medicine

## 2019-07-14 DIAGNOSIS — R112 Nausea with vomiting, unspecified: Secondary | ICD-10-CM

## 2019-07-16 MED ORDER — HYDROXYZINE HCL 10 MG PO TABS: 10.0000 mg | ORAL_TABLET | Freq: Every day | ORAL | 0 refills | Status: DC | PRN

## 2019-07-16 MED ORDER — ONDANSETRON 4 MG PO TBDP: 4.0000 mg | ORAL_TABLET | Freq: Three times a day (TID) | ORAL | 0 refills | Status: DC | PRN

## 2019-07-16 NOTE — Telephone Encounter (Signed)
Hydroxyzine last filled 12/12/2017 and Zofran last filled 03/2017... please advise

## 2019-07-16 NOTE — Telephone Encounter (Signed)
mychart msg sent to pt 

## 2019-07-16 NOTE — Telephone Encounter (Signed)
Seen by Dr. Einar Pheasant, advised to follow up with me in 6 weeks. Ok to set up for virtual visit. Meds refilled.

## 2019-07-28 ENCOUNTER — Other Ambulatory Visit: Payer: Self-pay | Admitting: Internal Medicine

## 2019-08-07 ENCOUNTER — Other Ambulatory Visit: Payer: Self-pay | Admitting: Internal Medicine

## 2019-08-08 ENCOUNTER — Other Ambulatory Visit: Payer: Self-pay | Admitting: Family Medicine

## 2019-08-08 DIAGNOSIS — F411 Generalized anxiety disorder: Secondary | ICD-10-CM

## 2019-08-08 DIAGNOSIS — F332 Major depressive disorder, recurrent severe without psychotic features: Secondary | ICD-10-CM

## 2019-08-10 NOTE — Telephone Encounter (Signed)
Patient is due for CPE. Left message for patient to call back to schedule. Bupropion was last prescribed by Dr Einar Pheasant in June but needs to follow up with PCP on this.

## 2019-08-11 NOTE — Telephone Encounter (Signed)
I will let pt know she needs to go ahead and schedule her CPE that is due after 08/27/2019.Marland KitchenMarland KitchenMarland Kitchen please advise if okay to refill as it was last filled by Dr Einar Pheasant

## 2019-09-14 ENCOUNTER — Other Ambulatory Visit: Payer: Self-pay | Admitting: Internal Medicine

## 2019-09-14 DIAGNOSIS — F411 Generalized anxiety disorder: Secondary | ICD-10-CM

## 2019-09-14 DIAGNOSIS — F332 Major depressive disorder, recurrent severe without psychotic features: Secondary | ICD-10-CM

## 2019-10-06 ENCOUNTER — Other Ambulatory Visit: Payer: Self-pay | Admitting: Internal Medicine

## 2019-10-06 DIAGNOSIS — K219 Gastro-esophageal reflux disease without esophagitis: Secondary | ICD-10-CM

## 2019-10-09 ENCOUNTER — Other Ambulatory Visit: Payer: Self-pay | Admitting: Internal Medicine

## 2019-10-09 DIAGNOSIS — F411 Generalized anxiety disorder: Secondary | ICD-10-CM

## 2019-10-09 DIAGNOSIS — F332 Major depressive disorder, recurrent severe without psychotic features: Secondary | ICD-10-CM

## 2019-10-12 ENCOUNTER — Other Ambulatory Visit: Payer: Self-pay | Admitting: Internal Medicine

## 2019-10-12 DIAGNOSIS — F332 Major depressive disorder, recurrent severe without psychotic features: Secondary | ICD-10-CM

## 2019-10-12 DIAGNOSIS — F411 Generalized anxiety disorder: Secondary | ICD-10-CM

## 2019-10-13 NOTE — Telephone Encounter (Signed)
I sent a letter in Sept 1st letting pt know that she is overdue for CPE and f/u... I have been attaching notes to Rx refills that pt must schedule office visit from 06/08/2019 w/ Dr Einar Pheasant, pt should have followed up with PCP

## 2019-10-28 DIAGNOSIS — Z20828 Contact with and (suspected) exposure to other viral communicable diseases: Secondary | ICD-10-CM | POA: Diagnosis not present

## 2019-10-28 DIAGNOSIS — J069 Acute upper respiratory infection, unspecified: Secondary | ICD-10-CM | POA: Diagnosis not present

## 2019-11-10 ENCOUNTER — Ambulatory Visit: Payer: Federal, State, Local not specified - PPO | Admitting: Internal Medicine

## 2019-11-10 ENCOUNTER — Encounter: Payer: Self-pay | Admitting: Internal Medicine

## 2019-11-10 ENCOUNTER — Other Ambulatory Visit: Payer: Self-pay

## 2019-11-10 ENCOUNTER — Other Ambulatory Visit: Payer: Self-pay | Admitting: Internal Medicine

## 2019-11-10 VITALS — BP 132/84 | HR 79 | Temp 98.2°F | Wt 230.0 lb

## 2019-11-10 DIAGNOSIS — I10 Essential (primary) hypertension: Secondary | ICD-10-CM

## 2019-11-10 DIAGNOSIS — K219 Gastro-esophageal reflux disease without esophagitis: Secondary | ICD-10-CM | POA: Diagnosis not present

## 2019-11-10 DIAGNOSIS — F329 Major depressive disorder, single episode, unspecified: Secondary | ICD-10-CM

## 2019-11-10 DIAGNOSIS — F419 Anxiety disorder, unspecified: Secondary | ICD-10-CM | POA: Diagnosis not present

## 2019-11-10 DIAGNOSIS — Z23 Encounter for immunization: Secondary | ICD-10-CM

## 2019-11-10 DIAGNOSIS — E039 Hypothyroidism, unspecified: Secondary | ICD-10-CM | POA: Diagnosis not present

## 2019-11-10 DIAGNOSIS — F5101 Primary insomnia: Secondary | ICD-10-CM

## 2019-11-10 DIAGNOSIS — E7800 Pure hypercholesterolemia, unspecified: Secondary | ICD-10-CM

## 2019-11-10 DIAGNOSIS — F32A Depression, unspecified: Secondary | ICD-10-CM

## 2019-11-10 DIAGNOSIS — B354 Tinea corporis: Secondary | ICD-10-CM

## 2019-11-10 DIAGNOSIS — G43C1 Periodic headache syndromes in child or adult, intractable: Secondary | ICD-10-CM

## 2019-11-10 MED ORDER — BUPROPION HCL ER (SR) 150 MG PO TB12: 150.0000 mg | ORAL_TABLET | Freq: Every day | ORAL | 3 refills | Status: DC

## 2019-11-10 MED ORDER — HYDROXYZINE HCL 10 MG PO TABS: 10.0000 mg | ORAL_TABLET | Freq: Every day | ORAL | 3 refills | Status: DC | PRN

## 2019-11-10 MED ORDER — LISINOPRIL 20 MG PO TABS: ORAL_TABLET | ORAL | 3 refills | Status: DC

## 2019-11-10 MED ORDER — OMEPRAZOLE 20 MG PO CPDR: 20.0000 mg | DELAYED_RELEASE_CAPSULE | Freq: Every day | ORAL | 2 refills | Status: DC

## 2019-11-10 MED ORDER — MELOXICAM 15 MG PO TABS: ORAL_TABLET | ORAL | 3 refills | Status: DC

## 2019-11-10 MED ORDER — NAFTIFINE HCL 1 % EX GEL: 1.0000 "application " | Freq: Two times a day (BID) | CUTANEOUS | 0 refills | Status: DC

## 2019-11-10 MED ORDER — FLUOXETINE HCL 40 MG PO CAPS: 40.0000 mg | ORAL_CAPSULE | Freq: Every day | ORAL | 3 refills | Status: DC

## 2019-11-10 NOTE — Assessment & Plan Note (Addendum)
Avoid triggers Continue Excedrin Migraine prn Will monitor

## 2019-11-10 NOTE — Assessment & Plan Note (Addendum)
Continue taking Lisinopril as prescribed Reinforced DASH diet and exercise for weight loss Will monitor

## 2019-11-10 NOTE — Assessment & Plan Note (Addendum)
Continue taking Fluoxetine, Wellbutrin and hydroxyzine as prescribed Support offered today Will monitor

## 2019-11-10 NOTE — Assessment & Plan Note (Addendum)
CBC and CMET today Continue Omeprazole Discussed how avoiding foods and weight loss could help reduce symptoms

## 2019-11-10 NOTE — Assessment & Plan Note (Addendum)
Recently stopped taking Trazodone, will d/c Encouraged calming sleep routine Will monitor

## 2019-11-10 NOTE — Assessment & Plan Note (Addendum)
TSH and Free T4 today Continue Levothyroxine at this time

## 2019-11-10 NOTE — Assessment & Plan Note (Addendum)
CMET, Lipid and A1C today Encouraged to consume low fat diet Will monitor

## 2019-11-10 NOTE — Progress Notes (Addendum)
Subjective:    Patient ID: Anne Mcguire, female    DOB: [birth year], 48 y.o.   MRN: [id number]  HPI  Pt presents to the clinic today for follow up of chronic conditions.   HTN: Her BP today is 132/84. She is taking Lisinopril daily as prescribed. She does not check her BP regularly. There is no ECG on file.   Anxiety and Depression: Chronic. Managed on Fluoxetine, Wellbutrin and Hydroxyzine. She is not currently seeing a therapist. She denies SI/HI.  Insomnia: She has difficulty falling asleep. She no longer takes the Trazadone. Sleep study from 08/2018 reviewed.  GERD, Hx of PUD: Triggered by spicy foods, sausage and tomato based foods. She denies breakthrough on Omeprazole. There is no upper GI on file.  HLD: Her last LDL was 91, Trig 172, 08/2017 . She is not taking any cholesterol lowering medication. She tries to consume a low fat diet.  Hypothyroidism: She denies any issues on her current dose of Levothyroxine. She does not follow up with endocrinology. She is due for repeat labs today.   Migraines: These occur infrequently. She takes Excedrin Migraine with good relief. She is not following with neurology.   She also c/o ringworm on her right forearm. She noticed this 1 week ago. It was itchy initially but that has resolved. It has increased in size. She has tried Lotrimin cream and tea tree oil with no relief.   Review of Systems  Past Medical History:  Diagnosis Date  . Anxiety   . Borderline hypertension    situational--  anxiety  . Depression   . GERD (gastroesophageal reflux disease)   . History of peptic ulcer    several years ago  . Hyperlipidemia   . Hypertension   . Hypothyroidism   . Menorrhagia   . Migraines   . Nocturia   . Wears glasses     Current Outpatient Medications  Medication Sig Dispense Refill  . aspirin-acetaminophen-caffeine (EXCEDRIN MIGRAINE) 250-250-65 MG tablet Take by mouth every 6 (six) hours as needed for headache.    Marland Kitchen  buPROPion (WELLBUTRIN SR) 150 MG 12 hr tablet Take 1 tablet (150 mg total) by mouth 2 (two) times daily. 60 tablet 0  . FLUoxetine (PROZAC) 40 MG capsule TAKE 1 CAPSULE BY MOUTH TWICE A DAY 180 capsule 0  . fluticasone (FLONASE) 50 MCG/ACT nasal spray Place 2 sprays into both nostrils as needed.     . hydrOXYzine (ATARAX/VISTARIL) 10 MG tablet TAKE 1 TABLET BY MOUTH EVERY DAY AS NEEDED 30 tablet 0  . levothyroxine (SYNTHROID) 100 MCG tablet TAKE 1 TABLET (100 MCG TOTAL) BY MOUTH DAILY BEFORE BREAKFAST **MUST SCHEDULE ANNUAL PHYSICAL** 90 tablet 0  . lisinopril (ZESTRIL) 20 MG tablet TAKE 1 TABLET (20 MG TOTAL) BY MOUTH DAILY. **MUST SCHEDULE ANNUAL PHYSICAL** 90 tablet 0  . meloxicam (MOBIC) 15 MG tablet meloxicam 15 mg tablet    . omeprazole (PRILOSEC) 20 MG capsule TAKE 1 CAPSULE (20 MG TOTAL) BY MOUTH DAILY. MUST SCHEDULE PHYSICAL 30 capsule 0  . ondansetron (ZOFRAN ODT) 4 MG disintegrating tablet Take 1 tablet (4 mg total) by mouth every 8 (eight) hours as needed for nausea or vomiting. 20 tablet 0  . traZODone (DESYREL) 50 MG tablet TAKE 0.5-1 TABLETS (25-50 MG TOTAL) BY MOUTH AT BEDTIME AS NEEDED FOR SLEEP (Patient not taking: Reported on 06/08/2019) 90 tablet 0   No current facility-administered medications for this visit.     Allergies  Allergen Reactions  . Azithromycin Hives  .  Eggs Or Egg-Derived Products Itching and Rash    THROAT ITCH    Family History  Problem Relation Age of Onset  . Mental illness Mother   . Arthritis Father   . Hyperlipidemia Father   . Hypertension Father   . Diabetes Father   . Arthritis Maternal Grandmother   . Diabetes Maternal Grandmother   . Arthritis Maternal Grandfather   . Diabetes Maternal Grandfather   . Arthritis Paternal Grandmother   . Breast cancer Paternal Grandmother     Social History   Socioeconomic History  . Marital status: Married    Spouse name: Not on file  . Number of children: Not on file  . Years of education: Not  on file  . Highest education level: Not on file  Occupational History  . Not on file  Social Needs  . Financial resource strain: Not on file  . Food insecurity    Worry: Not on file    Inability: Not on file  . Transportation needs    Medical: Not on file    Non-medical: Not on file  Tobacco Use  . Smoking status: Never Smoker  . Smokeless tobacco: Never Used  Substance and Sexual Activity  . Alcohol use: Yes    Comment: occasional  . Drug use: No  . Sexual activity: Yes    Birth control/protection: I.U.D.  Lifestyle  . Physical activity    Days per week: Not on file    Minutes per session: Not on file  . Stress: Not on file  Relationships  . Social Herbalist on phone: Not on file    Gets together: Not on file    Attends religious service: Not on file    Active member of club or organization: Not on file    Attends meetings of clubs or organizations: Not on file    Relationship status: Not on file  . Intimate partner violence    Fear of current or ex partner: Not on file    Emotionally abused: Not on file    Physically abused: Not on file    Forced sexual activity: Not on file  Other Topics Concern  . Not on file  Social History Narrative  . Not on file     Constitutional: Denies fever, malaise, fatigue, headache or abrupt weight changes.  HEENT: Denies eye pain, eye redness, ear pain, ringing in the ears, wax buildup, runny nose, nasal congestion, bloody nose, or sore throat. Respiratory: Denies difficulty breathing, shortness of breath, cough or sputum production.   Cardiovascular: Denies chest pain, chest tightness, palpitations or swelling in the hands or feet.  Gastrointestinal: Denies abdominal pain, bloating, constipation, diarrhea or blood in the stool.  GU: Denies urgency, frequency, pain with urination, burning sensation, blood in urine, odor or discharge. Musculoskeletal: Denies decrease in range of motion, difficulty with gait, muscle pain or  joint pain and swelling.  Skin: Pt reports a round lesion on right forearm. Denies ulcerations. Neurological: Pt reports insomnia. Denies dizziness, difficulty with memory, difficulty with speech or problems with balance and coordination.  Psych: Pt has a history of anxiety and depression, SI/HI.  No other specific complaints in a complete review of systems (except as listed in HPI above).     Objective:   Physical Exam  BP 132/84   Pulse 79   Temp 98.2 F (36.8 C) (Temporal)   Wt 230 lb (104.3 kg)   SpO2 98%   BMI 39.48 kg/m  Wt Readings from Last 3 Encounters:  06/08/19 234 lb (106.1 kg)  10/30/18 240 lb (108.9 kg)  07/24/18 237 lb (107.5 kg)    General: Appears her stated age, obese, in NAD. Skin: There is a circular, slightly raised lesion on Right mid-foream. It is erythematous with distinctive round border. It measures approx 1 cm in diameter. Neck:  Neck supple, trachea midline. No masses, lumps  present.  Cardiovascular: Normal rate and rhythm. S1,S2 noted.  No murmur, rubs or gallops noted. No JVD or BLE edema.  Pulmonary/Chest: Normal effort and positive vesicular breath sounds. No respiratory distress. No wheezes, rales or ronchi noted.  Abdomen: Soft and nontender. Normal bowel sounds. No distention or masses noted.  Musculoskeletal:  No difficulty with gait.  Neurological: Alert and oriented.  Psychiatric: Mood and affect normal. Behavior is normal. Judgment and thought content normal.    BMET    Component Value Date/Time   NA 140 04/07/2018 0828   K 4.2 04/07/2018 0828   CL 103 04/07/2018 0828   CO2 30 04/07/2018 0828   GLUCOSE 106 (H) 04/07/2018 0828   BUN 11 04/07/2018 0828   CREATININE 0.69 04/07/2018 0828   CALCIUM 9.0 04/07/2018 0828    Lipid Panel     Component Value Date/Time   CHOL 184 08/29/2017 0852   TRIG 172.0 (H) 08/29/2017 0852   HDL 58.60 08/29/2017 0852   CHOLHDL 3 08/29/2017 0852   VLDL 34.4 08/29/2017 0852   LDLCALC 91  08/29/2017 0852    CBC    Component Value Date/Time   WBC 6.0 04/07/2018 0828   RBC 4.43 04/07/2018 0828   HGB 13.4 04/07/2018 0828   HCT 40.2 04/07/2018 0828   PLT 157.0 04/07/2018 0828   MCV 90.7 04/07/2018 0828   MCHC 33.3 04/07/2018 0828   RDW 15.0 04/07/2018 0828    Hgb A1C Lab Results  Component Value Date   HGBA1C 5.9 04/07/2018            Assessment & Plan:  Tinea Corporis:  Rx for Naftifine gel BID x 4 weeks  Make an appt for your annual exam Webb Silversmith, NP This visit occurred during the SARS-CoV-2 public health emergency.  Safety protocols were in place, including screening questions prior to the visit, additional usage of staff PPE, and extensive cleaning of exam room while observing appropriate contact time as indicated for disinfecting solutions.

## 2019-11-11 ENCOUNTER — Encounter: Payer: Self-pay | Admitting: Internal Medicine

## 2019-11-11 LAB — COMPREHENSIVE METABOLIC PANEL
ALT: 12 U/L (ref 0–35)
AST: 13 U/L (ref 0–37)
Albumin: 4.2 g/dL (ref 3.5–5.2)
Alkaline Phosphatase: 58 U/L (ref 39–117)
BUN: 18 mg/dL (ref 6–23)
CO2: 28 mEq/L (ref 19–32)
Calcium: 9.3 mg/dL (ref 8.4–10.5)
Chloride: 105 mEq/L (ref 96–112)
Creatinine, Ser: 0.8 mg/dL (ref 0.40–1.20)
GFR: 76.63 mL/min (ref >60.00)
Glucose, Bld: 115 mg/dL — ABNORMAL HIGH (ref 70–99)
Potassium: 4.7 mEq/L (ref 3.5–5.1)
Sodium: 140 mEq/L (ref 135–145)
Total Bilirubin: 0.3 mg/dL (ref 0.2–1.2)
Total Protein: 6.9 g/dL (ref 6.0–8.3)

## 2019-11-11 LAB — CBC
HCT: 40.5 % (ref 36.0–46.0)
Hemoglobin: 13.3 g/dL (ref 12.0–15.0)
MCHC: 32.9 g/dL (ref 30.0–36.0)
MCV: 91.5 fl (ref 78.0–100.0)
Platelets: 188 10*3/uL (ref 150.0–400.0)
RBC: 4.43 Mil/uL (ref 3.87–5.11)
RDW: 14.6 % (ref 11.5–15.5)
WBC: 9.6 10*3/uL (ref 4.0–10.5)

## 2019-11-11 LAB — LIPID PANEL
Cholesterol: 218 mg/dL — ABNORMAL HIGH (ref 0–200)
HDL: 45.8 mg/dL (ref >39.00)
LDL Cholesterol: 133 mg/dL — ABNORMAL HIGH (ref 0–99)
NonHDL: 172.16
Total CHOL/HDL Ratio: 5
Triglycerides: 195 mg/dL — ABNORMAL HIGH (ref 0.0–149.0)
VLDL: 39 mg/dL (ref 0.0–40.0)

## 2019-11-11 LAB — HEMOGLOBIN A1C: Hgb A1c MFr Bld: 5.9 % (ref 4.6–6.5)

## 2019-11-11 LAB — TSH: TSH: 1.04 u[IU]/mL (ref 0.35–4.50)

## 2019-11-11 LAB — T4, FREE: Free T4: 0.9 ng/dL (ref 0.60–1.60)

## 2019-11-11 NOTE — Patient Instructions (Signed)

## 2019-11-13 ENCOUNTER — Encounter: Payer: Self-pay | Admitting: Internal Medicine

## 2019-11-16 MED ORDER — ATORVASTATIN CALCIUM 10 MG PO TABS: 10.0000 mg | ORAL_TABLET | Freq: Every day | ORAL | 2 refills | Status: DC

## 2019-11-16 MED ORDER — LEVOTHYROXINE SODIUM 100 MCG PO TABS: 100.0000 ug | ORAL_TABLET | Freq: Every day | ORAL | 1 refills | Status: DC

## 2020-01-07 ENCOUNTER — Ambulatory Visit (INDEPENDENT_AMBULATORY_CARE_PROVIDER_SITE_OTHER): Payer: Federal, State, Local not specified - PPO | Admitting: Internal Medicine

## 2020-01-07 ENCOUNTER — Encounter: Payer: Self-pay | Admitting: Internal Medicine

## 2020-01-07 ENCOUNTER — Other Ambulatory Visit: Payer: Self-pay

## 2020-01-07 VITALS — BP 130/82 | HR 90 | Temp 98.4°F | Ht 64.0 in | Wt 234.0 lb

## 2020-01-07 DIAGNOSIS — Z0000 Encounter for general adult medical examination without abnormal findings: Secondary | ICD-10-CM

## 2020-01-07 DIAGNOSIS — Z1211 Encounter for screening for malignant neoplasm of colon: Secondary | ICD-10-CM

## 2020-01-07 NOTE — Progress Notes (Signed)
Subjective:    Patient ID: Anne Mcguire, female    DOB: [birth year], 49 y.o.   MRN: [id number]  HPI  Pt presents to the clinic today for her annual exam.   Flu: 11/2019 Tetanus: 08/2012 Pap Smear: 01/2016 Mammogram: 08/2018 Colon Screening: never Vision Screening: annually Dentist: biannually  Diet: She eats meat, fruits and veggies. She avoids fried foods. She drinks coffee and Lipton tea, she will try to increase water consumption.  Exercise: No formal exercise.  Review of Systems      Past Medical History:  Diagnosis Date  . Anxiety   . Borderline hypertension    situational--  anxiety  . Depression   . GERD (gastroesophageal reflux disease)   . History of peptic ulcer    several years ago  . Hyperlipidemia   . Hypertension   . Hypothyroidism   . Menorrhagia   . Migraines   . Nocturia   . Wears glasses     Current Outpatient Medications  Medication Sig Dispense Refill  . aspirin-acetaminophen-caffeine (EXCEDRIN MIGRAINE) 250-250-65 MG tablet Take by mouth every 6 (six) hours as needed for headache.    Marland Kitchen atorvastatin (LIPITOR) 10 MG tablet Take 1 tablet (10 mg total) by mouth daily. 30 tablet 2  . buPROPion (WELLBUTRIN SR) 150 MG 12 hr tablet Take 1 tablet (150 mg total) by mouth daily. 90 tablet 3  . FLUoxetine (PROZAC) 40 MG capsule Take 1 capsule (40 mg total) by mouth daily. 90 capsule 3  . fluticasone (FLONASE) 50 MCG/ACT nasal spray Place 2 sprays into both nostrils as needed.     . hydrOXYzine (ATARAX/VISTARIL) 10 MG tablet Take 1 tablet (10 mg total) by mouth daily as needed. 90 tablet 3  . levothyroxine (SYNTHROID) 100 MCG tablet Take 1 tablet (100 mcg total) by mouth daily before breakfast. 90 tablet 1  . lisinopril (ZESTRIL) 20 MG tablet TAKE 1 TABLET (20 MG TOTAL) BY MOUTH DAILY. **MUST SCHEDULE ANNUAL PHYSICAL** 90 tablet 3  . meloxicam (MOBIC) 15 MG tablet meloxicam 15 mg tablet 90 tablet 3  . Naftifine HCl 1 % GEL Apply 1 application topically  2 (two) times daily. 30 g 0  . omeprazole (PRILOSEC) 20 MG capsule Take 1 capsule (20 mg total) by mouth daily. 90 capsule 2  . ondansetron (ZOFRAN ODT) 4 MG disintegrating tablet Take 1 tablet (4 mg total) by mouth every 8 (eight) hours as needed for nausea or vomiting. 20 tablet 0   No current facility-administered medications for this visit.    Allergies  Allergen Reactions  . Azithromycin Hives  . Eggs Or Egg-Derived Products Itching and Rash    THROAT ITCH    Family History  Problem Relation Age of Onset  . Mental illness Mother   . Arthritis Father   . Hyperlipidemia Father   . Hypertension Father   . Diabetes Father   . Arthritis Maternal Grandmother   . Diabetes Maternal Grandmother   . Arthritis Maternal Grandfather   . Diabetes Maternal Grandfather   . Arthritis Paternal Grandmother   . Breast cancer Paternal Grandmother     Social History   Socioeconomic History  . Marital status: Married    Spouse name: Not on file  . Number of children: Not on file  . Years of education: Not on file  . Highest education level: Not on file  Occupational History  . Not on file  Tobacco Use  . Smoking status: Never Smoker  . Smokeless tobacco: Never Used  Substance and Sexual Activity  . Alcohol use: Yes    Comment: occasional  . Drug use: No  . Sexual activity: Yes    Birth control/protection: I.U.D.  Other Topics Concern  . Not on file  Social History Narrative  . Not on file   Social Determinants of Health   Financial Resource Strain:   . Difficulty of Paying Living Expenses: Not on file  Food Insecurity:   . Worried About Charity fundraiser in the Last Year: Not on file  . Ran Out of Food in the Last Year: Not on file  Transportation Needs:   . Lack of Transportation (Medical): Not on file  . Lack of Transportation (Non-Medical): Not on file  Physical Activity:   . Days of Exercise per Week: Not on file  . Minutes of Exercise per Session: Not on file    Stress:   . Feeling of Stress : Not on file  Social Connections:   . Frequency of Communication with Friends and Family: Not on file  . Frequency of Social Gatherings with Friends and Family: Not on file  . Attends Religious Services: Not on file  . Active Member of Clubs or Organizations: Not on file  . Attends Archivist Meetings: Not on file  . Marital Status: Not on file  Intimate Partner Violence:   . Fear of Current or Ex-Partner: Not on file  . Emotionally Abused: Not on file  . Physically Abused: Not on file  . Sexually Abused: Not on file     Constitutional: Pt reports intermittent headaches. Denies fever, malaise, fatigue, or abrupt weight changes.  HEENT: Denies eye pain, eye redness, ear pain, ringing in the ears, wax buildup, runny nose, nasal congestion, bloody nose, or sore throat. Respiratory: Denies difficulty breathing, shortness of breath, cough or sputum production.   Cardiovascular: Denies chest pain, chest tightness, palpitations or swelling in the hands or feet.  Gastrointestinal: Pt reports intermittent reflux. Denies abdominal pain, bloating, constipation, diarrhea or blood in the stool.  GU: Denies urgency, frequency, pain with urination, burning sensation, blood in urine, odor or discharge. Musculoskeletal: Denies decrease in range of motion, difficulty with gait, muscle pain or joint pain and swelling.  Skin: Denies redness, rashes, lesions or ulcercations.  Neurological: Pt reports insomnia, feelings of jitteriness. Denies dizziness, difficulty with memory, difficulty with speech or problems with balance and coordination.  Psych: Pt has a history of anxiety and depression. Denies SI/HI.   No other specific complaints in a complete review of systems (except as listed in HPI above).  Objective:   Physical Exam  BP 130/82   Pulse 90   Temp 98.4 F (36.9 C) (Temporal)   Ht 5\' 4"  (1.626 m)   Wt 234 lb (106.1 kg)   SpO2 98%   BMI 40.17 kg/m    Wt Readings from Last 3 Encounters:  11/10/19 230 lb (104.3 kg)  06/08/19 234 lb (106.1 kg)  10/30/18 240 lb (108.9 kg)    General: Appears her stated age, obese, in NAD. Skin: Warm, dry and intact. No rashes noted. HEENT: Head: normal shape and size; Eyes: sclera white, no icterus, conjunctiva pink, PERRLA and EOMs intact. Cardiovascular: Normal rate and rhythm. S1,S2 noted.  No murmur, rubs or gallops noted. No JVD or BLE edema.  Pulmonary/Chest: Normal effort and positive vesicular breath sounds. No respiratory distress. No wheezes, rales or ronchi noted.  Abdomen: Soft and nontender. Normal bowel sounds. No distention or masses noted. Liver, spleen  and kidneys non palpable. Musculoskeletal: Strength 5/5 BUE/BLE.  No difficulty with gait.  Neurological: Alert and oriented. Cranial nerves II-XII grossly intact. Coordination normal.  Psychiatric: Mood and affect normal. Behavior is normal. Judgment and thought content normal.    BMET    Component Value Date/Time   NA 140 11/10/2019 1643   K 4.7 11/10/2019 1643   CL 105 11/10/2019 1643   CO2 28 11/10/2019 1643   GLUCOSE 115 (H) 11/10/2019 1643   BUN 18 11/10/2019 1643   CREATININE 0.80 11/10/2019 1643   CALCIUM 9.3 11/10/2019 1643    Lipid Panel     Component Value Date/Time   CHOL 218 (H) 11/10/2019 1643   TRIG 195.0 (H) 11/10/2019 1643   HDL 45.80 11/10/2019 1643   CHOLHDL 5 11/10/2019 1643   VLDL 39.0 11/10/2019 1643   LDLCALC 133 (H) 11/10/2019 1643    CBC    Component Value Date/Time   WBC 9.6 11/10/2019 1643   RBC 4.43 11/10/2019 1643   HGB 13.3 11/10/2019 1643   HCT 40.5 11/10/2019 1643   PLT 188.0 11/10/2019 1643   MCV 91.5 11/10/2019 1643   MCHC 32.9 11/10/2019 1643   RDW 14.6 11/10/2019 1643    Hgb A1C Lab Results  Component Value Date   HGBA1C 5.9 11/10/2019          Assessment & Plan:   Preventative Health Maintenance:  Flu shot UTD Tetanus UTD Pap Smear UTD Mammogram due, she  will call to schedule Referral to GI for screening colonoscopy Encouraged her to consume a balanced diet and exercise regimen Advised her to see an eye doctor and dentist annually Labs from 11/2019 reviewed  RTC in 6 months, follow up chronic conditions Webb Silversmith, NP This visit occurred during the SARS-CoV-2 public health emergency.  Safety protocols were in place, including screening questions prior to the visit, additional usage of staff PPE, and extensive cleaning of exam room while observing appropriate contact time as indicated for disinfecting solutions.

## 2020-01-07 NOTE — Patient Instructions (Signed)
Health Maintenance, Female Adopting a healthy lifestyle and getting preventive care are important in promoting health and wellness. Ask your health care provider about:  The right schedule for you to have regular tests and exams.  Things you can do on your own to prevent diseases and keep yourself healthy. What should I know about diet, weight, and exercise? Eat a healthy diet   Eat a diet that includes plenty of vegetables, fruits, low-fat dairy products, and lean protein.  Do not eat a lot of foods that are high in solid fats, added sugars, or sodium. Maintain a healthy weight Body mass index (BMI) is used to identify weight problems. It estimates body fat based on height and weight. Your health care provider can help determine your BMI and help you achieve or maintain a healthy weight. Get regular exercise Get regular exercise. This is one of the most important things you can do for your health. Most adults should:  Exercise for at least 150 minutes each week. The exercise should increase your heart rate and make you sweat (moderate-intensity exercise).  Do strengthening exercises at least twice a week. This is in addition to the moderate-intensity exercise.  Spend less time sitting. Even light physical activity can be beneficial. Watch cholesterol and blood lipids Have your blood tested for lipids and cholesterol at 48 years of age, then have this test every 5 years. Have your cholesterol levels checked more often if:  Your lipid or cholesterol levels are high.  You are older than 48 years of age.  You are at high risk for heart disease. What should I know about cancer screening? Depending on your health history and family history, you may need to have cancer screening at various ages. This may include screening for:  Breast cancer.  Cervical cancer.  Colorectal cancer.  Skin cancer.  Lung cancer. What should I know about heart disease, diabetes, and high blood  pressure? Blood pressure and heart disease  High blood pressure causes heart disease and increases the risk of stroke. This is more likely to develop in people who have high blood pressure readings, are of African descent, or are overweight.  Have your blood pressure checked: ? Every 3-5 years if you are 18-39 years of age. ? Every year if you are 40 years old or older. Diabetes Have regular diabetes screenings. This checks your fasting blood sugar level. Have the screening done:  Once every three years after age 40 if you are at a normal weight and have a low risk for diabetes.  More often and at a younger age if you are overweight or have a high risk for diabetes. What should I know about preventing infection? Hepatitis B If you have a higher risk for hepatitis B, you should be screened for this virus. Talk with your health care provider to find out if you are at risk for hepatitis B infection. Hepatitis C Testing is recommended for:  Everyone born from 1945 through 1965.  Anyone with known risk factors for hepatitis C. Sexually transmitted infections (STIs)  Get screened for STIs, including gonorrhea and chlamydia, if: ? You are sexually active and are younger than 48 years of age. ? You are older than 48 years of age and your health care provider tells you that you are at risk for this type of infection. ? Your sexual activity has changed since you were last screened, and you are at increased risk for chlamydia or gonorrhea. Ask your health care provider if   you are at risk.  Ask your health care provider about whether you are at high risk for HIV. Your health care provider may recommend a prescription medicine to help prevent HIV infection. If you choose to take medicine to prevent HIV, you should first get tested for HIV. You should then be tested every 3 months for as long as you are taking the medicine. Pregnancy  If you are about to stop having your period (premenopausal) and  you may become pregnant, seek counseling before you get pregnant.  Take 400 to 800 micrograms (mcg) of folic acid every day if you become pregnant.  Ask for birth control (contraception) if you want to prevent pregnancy. Osteoporosis and menopause Osteoporosis is a disease in which the bones lose minerals and strength with aging. This can result in bone fractures. If you are 65 years old or older, or if you are at risk for osteoporosis and fractures, ask your health care provider if you should:  Be screened for bone loss.  Take a calcium or vitamin D supplement to lower your risk of fractures.  Be given hormone replacement therapy (HRT) to treat symptoms of menopause. Follow these instructions at home: Lifestyle  Do not use any products that contain nicotine or tobacco, such as cigarettes, e-cigarettes, and chewing tobacco. If you need help quitting, ask your health care provider.  Do not use street drugs.  Do not share needles.  Ask your health care provider for help if you need support or information about quitting drugs. Alcohol use  Do not drink alcohol if: ? Your health care provider tells you not to drink. ? You are pregnant, may be pregnant, or are planning to become pregnant.  If you drink alcohol: ? Limit how much you use to 0-1 drink a day. ? Limit intake if you are breastfeeding.  Be aware of how much alcohol is in your drink. In the [place] one drink equals one 12 oz bottle of beer (355 mL), one 5 oz glass of wine (148 mL), or one 1 oz glass of hard liquor (44 mL). General instructions  Schedule regular health, dental, and eye exams.  Stay current with your vaccines.  Tell your health care provider if: ? You often feel depressed. ? You have ever been abused or do not feel safe at home. Summary  Adopting a healthy lifestyle and getting preventive care are important in promoting health and wellness.  Follow your health care provider's instructions about healthy  diet, exercising, and getting tested or screened for diseases.  Follow your health care provider's instructions on monitoring your cholesterol and blood pressure. This information is not intended to replace advice given to you by your health care provider. Make sure you discuss any questions you have with your health care provider. Document Revised: 11/19/2018 Document Reviewed: 11/19/2018 [employer] Patient Education  2020 [employer]  

## 2020-01-19 ENCOUNTER — Other Ambulatory Visit: Payer: Self-pay | Admitting: Internal Medicine

## 2020-01-19 DIAGNOSIS — K219 Gastro-esophageal reflux disease without esophagitis: Secondary | ICD-10-CM

## 2020-02-12 ENCOUNTER — Other Ambulatory Visit: Payer: Self-pay | Admitting: Internal Medicine

## 2020-02-12 DIAGNOSIS — E7800 Pure hypercholesterolemia, unspecified: Secondary | ICD-10-CM

## 2020-02-24 ENCOUNTER — Other Ambulatory Visit: Payer: Federal, State, Local not specified - PPO

## 2020-03-25 ENCOUNTER — Other Ambulatory Visit (INDEPENDENT_AMBULATORY_CARE_PROVIDER_SITE_OTHER): Payer: Federal, State, Local not specified - PPO

## 2020-03-25 ENCOUNTER — Other Ambulatory Visit: Payer: Self-pay

## 2020-03-25 DIAGNOSIS — E7800 Pure hypercholesterolemia, unspecified: Secondary | ICD-10-CM

## 2020-03-25 LAB — COMPREHENSIVE METABOLIC PANEL
ALT: 12 U/L (ref 0–35)
AST: 13 U/L (ref 0–37)
Albumin: 4.2 g/dL (ref 3.5–5.2)
Alkaline Phosphatase: 64 U/L (ref 39–117)
BUN: 15 mg/dL (ref 6–23)
CO2: 28 mEq/L (ref 19–32)
Calcium: 9 mg/dL (ref 8.4–10.5)
Chloride: 101 mEq/L (ref 96–112)
Creatinine, Ser: 0.81 mg/dL (ref 0.40–1.20)
GFR: 75.42 mL/min (ref >60.00)
Glucose, Bld: 129 mg/dL — ABNORMAL HIGH (ref 70–99)
Potassium: 4.4 mEq/L (ref 3.5–5.1)
Sodium: 137 mEq/L (ref 135–145)
Total Bilirubin: 0.4 mg/dL (ref 0.2–1.2)
Total Protein: 6.9 g/dL (ref 6.0–8.3)

## 2020-03-25 LAB — LIPID PANEL
Cholesterol: 155 mg/dL (ref 0–200)
HDL: 50.6 mg/dL (ref >39.00)
LDL Cholesterol: 83 mg/dL (ref 0–99)
NonHDL: 104.89
Total CHOL/HDL Ratio: 3
Triglycerides: 107 mg/dL (ref 0.0–149.0)
VLDL: 21.4 mg/dL (ref 0.0–40.0)

## 2020-04-14 ENCOUNTER — Other Ambulatory Visit: Payer: Self-pay | Admitting: Internal Medicine

## 2020-04-19 ENCOUNTER — Other Ambulatory Visit: Payer: Self-pay | Admitting: Internal Medicine

## 2020-05-11 DIAGNOSIS — Z01419 Encounter for gynecological examination (general) (routine) without abnormal findings: Secondary | ICD-10-CM | POA: Diagnosis not present

## 2020-05-11 DIAGNOSIS — Z1231 Encounter for screening mammogram for malignant neoplasm of breast: Secondary | ICD-10-CM | POA: Diagnosis not present

## 2020-05-26 ENCOUNTER — Encounter: Payer: Self-pay | Admitting: Internal Medicine

## 2020-06-24 ENCOUNTER — Other Ambulatory Visit: Payer: Self-pay | Admitting: Internal Medicine

## 2020-07-19 ENCOUNTER — Other Ambulatory Visit: Payer: Self-pay | Admitting: Internal Medicine

## 2020-08-02 DIAGNOSIS — L918 Other hypertrophic disorders of the skin: Secondary | ICD-10-CM | POA: Diagnosis not present

## 2020-08-02 DIAGNOSIS — L821 Other seborrheic keratosis: Secondary | ICD-10-CM | POA: Diagnosis not present

## 2020-08-02 DIAGNOSIS — L720 Epidermal cyst: Secondary | ICD-10-CM | POA: Diagnosis not present

## 2020-08-13 ENCOUNTER — Other Ambulatory Visit: Payer: Self-pay | Admitting: Internal Medicine

## 2020-09-05 ENCOUNTER — Other Ambulatory Visit: Payer: Self-pay

## 2020-09-05 ENCOUNTER — Ambulatory Visit: Payer: Federal, State, Local not specified - PPO | Admitting: Podiatry

## 2020-09-05 DIAGNOSIS — M722 Plantar fascial fibromatosis: Secondary | ICD-10-CM

## 2020-09-05 MED ORDER — MELOXICAM 15 MG PO TABS: 15.0000 mg | ORAL_TABLET | Freq: Every day | ORAL | 1 refills | Status: DC

## 2020-09-05 MED ORDER — METHYLPREDNISOLONE 4 MG PO TBPK: ORAL_TABLET | ORAL | 0 refills | Status: DC

## 2020-09-05 NOTE — Progress Notes (Signed)
   Subjective: 49 y.o. female presenting today for evaluation of acute flareup to the plantar fascia of the right foot.  Patient has a history of plantar fasciitis.  She states that she has had right foot pain for approximately 2-3 months now.  She has not done anything for treatment other than recently beginning to take meloxicam which is helped minimally.  She presents for further treatment evaluation   Past Medical History:  Diagnosis Date  . Anxiety   . Borderline hypertension    situational--  anxiety  . Depression   . GERD (gastroesophageal reflux disease)   . History of peptic ulcer    several years ago  . Hyperlipidemia   . Hypertension   . Hypothyroidism   . Menorrhagia   . Migraines   . Nocturia   . Wears glasses      Objective: Physical Exam General: The patient is alert and oriented x3 in no acute distress.  Dermatology: Skin is warm, dry and supple bilateral lower extremities. Negative for open lesions or macerations bilateral.   Vascular: Dorsalis Pedis and Posterior Tibial pulses palpable bilateral.  Capillary fill time is immediate to all digits.  Neurological: Epicritic and protective threshold intact bilateral.   Musculoskeletal: Tenderness to palpation to the plantar aspect of the right plantar fascia. All other joints range of motion within normal limits bilateral. Strength 5/5 in all groups bilateral.   Assessment: 1. Plantar fasciitis right-mid substance  Plan of Care:  1. Patient evaluated.   2. Injection of 0.5cc Celestone soluspan injected into the right plantar fascia  3. Rx for Medrol Dose Pack placed 4. Rx for Meloxicam ordered for patient. 5.  Patient has had custom orthotics in the past and did not like them.  Continue wearing good [employer]y shoes 6. Instructed patient regarding therapies and modalities at home to alleviate symptoms.  7. Return to clinic in 4 weeks.    *Administrative for [place] service   Edrick Kins, DPM [facility]  Dr. Edrick Kins, DPM    [address], [place] [address]                Office [phone]  Fax [phone]

## 2020-09-21 DIAGNOSIS — K080 Exfoliation of teeth due to systemic causes: Secondary | ICD-10-CM | POA: Diagnosis not present

## 2020-10-03 ENCOUNTER — Ambulatory Visit: Payer: Federal, State, Local not specified - PPO | Admitting: Podiatry

## 2020-11-01 ENCOUNTER — Other Ambulatory Visit: Payer: Self-pay | Admitting: Internal Medicine

## 2020-11-08 ENCOUNTER — Other Ambulatory Visit: Payer: Self-pay | Admitting: Internal Medicine

## 2020-12-01 ENCOUNTER — Other Ambulatory Visit: Payer: Self-pay | Admitting: Internal Medicine

## 2020-12-10 DIAGNOSIS — U071 COVID-19: Secondary | ICD-10-CM

## 2020-12-10 HISTORY — DX: COVID-19: U07.1

## 2020-12-19 ENCOUNTER — Other Ambulatory Visit: Payer: Self-pay

## 2020-12-19 ENCOUNTER — Encounter: Payer: Self-pay | Admitting: Internal Medicine

## 2020-12-19 ENCOUNTER — Ambulatory Visit: Payer: Federal, State, Local not specified - PPO | Admitting: Internal Medicine

## 2020-12-19 VITALS — BP 136/94 | HR 83 | Temp 98.5°F

## 2020-12-19 DIAGNOSIS — R631 Polydipsia: Secondary | ICD-10-CM

## 2020-12-19 DIAGNOSIS — E039 Hypothyroidism, unspecified: Secondary | ICD-10-CM

## 2020-12-19 DIAGNOSIS — Z1159 Encounter for screening for other viral diseases: Secondary | ICD-10-CM | POA: Diagnosis not present

## 2020-12-19 DIAGNOSIS — R42 Dizziness and giddiness: Secondary | ICD-10-CM | POA: Diagnosis not present

## 2020-12-19 DIAGNOSIS — R5383 Other fatigue: Secondary | ICD-10-CM

## 2020-12-19 DIAGNOSIS — R7309 Other abnormal glucose: Secondary | ICD-10-CM | POA: Diagnosis not present

## 2020-12-19 DIAGNOSIS — F419 Anxiety disorder, unspecified: Secondary | ICD-10-CM

## 2020-12-19 DIAGNOSIS — R6889 Other general symptoms and signs: Secondary | ICD-10-CM

## 2020-12-19 DIAGNOSIS — G4733 Obstructive sleep apnea (adult) (pediatric): Secondary | ICD-10-CM

## 2020-12-19 DIAGNOSIS — H538 Other visual disturbances: Secondary | ICD-10-CM

## 2020-12-19 DIAGNOSIS — R3915 Urgency of urination: Secondary | ICD-10-CM | POA: Diagnosis not present

## 2020-12-19 DIAGNOSIS — R350 Frequency of micturition: Secondary | ICD-10-CM

## 2020-12-19 DIAGNOSIS — F32A Depression, unspecified: Secondary | ICD-10-CM

## 2020-12-19 DIAGNOSIS — I10 Essential (primary) hypertension: Secondary | ICD-10-CM

## 2020-12-19 LAB — POC URINALSYSI DIPSTICK (AUTOMATED)
Bilirubin, UA: NEGATIVE
Blood, UA: NEGATIVE
Glucose, UA: NEGATIVE
Ketones, UA: NEGATIVE
Leukocytes, UA: NEGATIVE
Nitrite, UA: NEGATIVE
Protein, UA: NEGATIVE
Spec Grav, UA: 1.015 (ref 1.010–1.025)
Urobilinogen, UA: 0.2 E.U./dL
pH, UA: 6.5 (ref 5.0–8.0)

## 2020-12-19 LAB — TSH: TSH: 1.22 u[IU]/mL (ref 0.35–4.50)

## 2020-12-19 LAB — POCT GLYCOSYLATED HEMOGLOBIN (HGB A1C): Hemoglobin A1C: 6.1 % — AB (ref 4.0–5.6)

## 2020-12-19 LAB — VITAMIN D 25 HYDROXY (VIT D DEFICIENCY, FRACTURES): VITD: 22.1 ng/mL — ABNORMAL LOW (ref 30.00–100.00)

## 2020-12-19 LAB — T4, FREE: Free T4: 1.05 ng/dL (ref 0.60–1.60)

## 2020-12-19 LAB — VITAMIN B12: Vitamin B-12: 289 pg/mL (ref 211–911)

## 2020-12-19 NOTE — Patient Instructions (Signed)
You are still prediabetic but not diabetic yet. A1C was . Try to consume a low carb diet and exercise for weight loss. We could consider using an injectable medication for Saxenda to aid in blood sugar control and weight loss.  Your fatigue and exhaustion could be from your untreated sleep apnea. We will check thyroid, vit d and b12 today and let you know if you need supplements or a change in your levothyroxine dose. If these values are normal, I would advise you to pick up the CPAP and start using this.  Please see your eye doctor for a vision exam.    Benign Positional Vertigo Vertigo is the feeling that you or your surroundings are moving when they are not. Benign positional vertigo is the most common form of vertigo. This is usually a harmless condition (benign). This condition is positional. This means that symptoms are triggered by certain movements and positions. This condition can be dangerous if it occurs while you are doing something that could cause harm to you or others. This includes activities such as driving or operating machinery. What are the causes? The inner ear has fluid-filled canals that help your brain sense movement and balance. When the fluid moves, the brain receives messages about your body's position. With benign positional vertigo, crystals in the inner ear break free and disturb the inner ear area. This causes your brain to receive confusing messages about your body's position. What increases the risk? You are more likely to develop this condition if:  You are a woman.  You are 49 years of age or older.  You have recently had a head injury.  You have an inner ear disease. What are the signs or symptoms? Symptoms of this condition usually happen when you move your head or your eyes in different directions. Symptoms may start suddenly, and usually last for less than a minute. They include:  Loss of balance and falling.  Feeling like you are spinning or  moving.  Feeling like your surroundings are spinning or moving.  Nausea and vomiting.  Blurred vision.  Dizziness.  Involuntary eye movement (nystagmus). Symptoms can be mild and cause only minor problems, or they can be severe and interfere with daily life. Episodes of benign positional vertigo may return (recur) over time. Symptoms may improve over time. How is this diagnosed? This condition may be diagnosed based on:  Your medical history.  Physical exam of the head, neck, and ears.  Positional tests to check for or stimulate vertigo. You may be asked to turn your head and change positions, such as going from sitting to lying down. A health care provider will watch for symptoms of vertigo. You may be referred to a health care provider who specializes in ear, nose, and throat problems (ENT, or otolaryngologist) or a provider who specializes in disorders of the nervous system (neurologist). How is this treated? This condition may be treated in a session in which your health care provider moves your head in specific positions to help the displaced crystals in your inner ear move. Treatment for this condition may take several sessions. Surgery may be needed in severe cases, but this is rare. In some cases, benign positional vertigo may resolve on its own in 2-4 weeks.   Follow these instructions at home: Safety  Move slowly. Avoid sudden body or head movements or certain positions, as told by your health care provider.  Avoid driving until your health care provider says it is safe for you  to do so.  Avoid operating heavy machinery until your health care provider says it is safe for you to do so.  Avoid doing any tasks that would be dangerous to you or others if vertigo occurs.  If you have trouble walking or keeping your balance, try using a cane for stability. If you feel dizzy or unstable, sit down right away.  Return to your normal activities as told by your health care  provider. Ask your health care provider what activities are safe for you. General instructions  Take over-the-counter and prescription medicines only as told by your health care provider.  Drink enough fluid to keep your urine pale yellow.  Keep all follow-up visits as told by your health care provider. This is important. Contact a health care provider if:  You have a fever.  Your condition gets worse or you develop new symptoms.  Your family or friends notice any behavioral changes.  You have nausea or vomiting that gets worse.  You have numbness or a prickling and tingling sensation. Get help right away if you:  Have difficulty speaking or moving.  Are always dizzy.  Faint.  Develop severe headaches.  Have weakness in your legs or arms.  Have changes in your hearing or vision.  Develop a stiff neck.  Develop sensitivity to light. Summary  Vertigo is the feeling that you or your surroundings are moving when they are not. Benign positional vertigo is the most common form of vertigo.  This condition is caused by crystals in the inner ear that become displaced. This causes a disturbance in an area of the inner ear that helps your brain sense movement and balance.  Symptoms include loss of balance and falling, feeling that you or your surroundings are moving, nausea and vomiting, and blurred vision.  This condition can be diagnosed based on symptoms, a physical exam, and positional tests.  Follow safety instructions as told by your health care provider. You will also be told when to contact your health care provider in case of problems. This information is not intended to replace advice given to you by your health care provider. Make sure you discuss any questions you have with your health care provider. Document Revised: 10/20/2019 Document Reviewed: 05/07/2018 Elsevier Patient Education  2021 [employer].

## 2020-12-19 NOTE — Addendum Note (Signed)
Addended by: Lurlean Nanny on: 12/19/2020 02:26 PM   Modules accepted: Orders

## 2020-12-19 NOTE — Progress Notes (Signed)
Subjective:    Patient ID: Anne Mcguire, female    DOB: 1971/05/10, 50 y.o.   MRN: [id number]  HPI  Patient presents to the clinic today with multiple complaints.  She reports intermittent dizzy spells.  She reports this is more like lightheadedness as opposed to the room spinning.  It does not occur with position changes, most often occurs while she is just standing up.  She is unsure if it was related to head movement.  She has not had any falls secondary to this.  She has not taken any medication over-the-counter for this.  Of note, her BP today is 136/94.  She is taking her Lisinopril as prescribed.    She also reports blurred vision, increased thirst urinary frequency, urinary urgency, increase in sweet cravings and 18 pound weight gain in the last year.  She knows she is not eating healthfully and does not have an exercise regimen.  She does wear glasses but has not seen her eye doctor in the last 1 to 2 years.  She has had cataract removal in the past.  She denies numbness or tingling of her upper or lower extremities or nonhealing wounds.  She does have a history of prediabetes, last A1c 5.9%.  She also reports that her anxiety is slightly worse.  This is due to situational stress.  She feels like her depression is the best it is ever been.  She is concerned about fatigue and exhaustion.  She reports she had a sleep study and was diagnosed with sleep apnea but never went to go pick up the CPAP machine so she is not using this.  She is currently taking Wellbutrin, Fluoxetine as prescribed.  She does take hydroxyzine which works intermittently for her anxiety but does not take this routinely.  She is not currently seeing a therapist.  She denies SI/HI.    Review of Systems      Past Medical History:  Diagnosis Date  . Anxiety   . Borderline hypertension    situational--  anxiety  . Depression   . GERD (gastroesophageal reflux disease)   . History of peptic ulcer    several years  ago  . Hyperlipidemia   . Hypertension   . Hypothyroidism   . Menorrhagia   . Migraines   . Nocturia   . Wears glasses     Current Outpatient Medications  Medication Sig Dispense Refill  . atorvastatin (LIPITOR) 10 MG tablet TAKE 1 TABLET BY MOUTH ONCE A DAY *SCHEDULE PHYSICAL EXAM* 30 tablet 0  . buPROPion (WELLBUTRIN SR) 150 MG 12 hr tablet Take 1 tablet (150 mg total) by mouth daily. 90 tablet 3  . FLUoxetine (PROZAC) 40 MG capsule TAKE 1 CAPSULE BY MOUTH DAILY 30 capsule 0  . hydrOXYzine (ATARAX/VISTARIL) 10 MG tablet TAKE ONE TABLET BY MOUTH DAILY AS NEEDED 30 tablet 0  . levothyroxine (SYNTHROID) 100 MCG tablet TAKE 1 TAB BY MOUTH ONCE DAILY. TAKE ON AN EMPTY STOMACH WITH A GLASS OF WATER ATLEAST 30-60 MINUTES BEFORE BREAKFAST 30 tablet 0  . lisinopril (ZESTRIL) 20 MG tablet TAKE 1 TABLET BY MOUTH ONCE DAILY 30 tablet 0  . meloxicam (MOBIC) 15 MG tablet Take 1 tablet (15 mg total) by mouth daily. 30 tablet 1  . omeprazole (PRILOSEC) 20 MG capsule TAKE 1 CAPSULE BY MOUTH ONCE DAILY. MUSTSCHEDULE PHYSICAL 30 capsule 4   No current facility-administered medications for this visit.    Allergies  Allergen Reactions  . Azithromycin Hives  .  Eggs Or Egg-Derived Products Itching and Rash    THROAT ITCH    Family History  Problem Relation Age of Onset  . Mental illness Mother   . Arthritis Father   . Hyperlipidemia Father   . Hypertension Father   . Diabetes Father   . Arthritis Maternal Grandmother   . Diabetes Maternal Grandmother   . Arthritis Maternal Grandfather   . Diabetes Maternal Grandfather   . Arthritis Paternal Grandmother   . Breast cancer Paternal Grandmother     Social History   Socioeconomic History  . Marital status: Married    Spouse name: Not on file  . Number of children: Not on file  . Years of education: Not on file  . Highest education level: Not on file  Occupational History  . Not on file  Tobacco Use  . Smoking status: Never Smoker  .  Smokeless tobacco: Never Used  Substance and Sexual Activity  . Alcohol use: Yes    Comment: occasional  . Drug use: No  . Sexual activity: Yes    Birth control/protection: I.U.D.  Other Topics Concern  . Not on file  Social History Narrative  . Not on file   Social Determinants of Health   Financial Resource Strain: Not on file  Food Insecurity: Not on file  Transportation Needs: Not on file  Physical Activity: Not on file  Stress: Not on file  Social Connections: Not on file  Intimate Partner Violence: Not on file     Constitutional: Patient reports fatigue and weight gain.  Denies fever, malaise, headache.  HEENT: Denies eye pain, eye redness, ear pain, ringing in the ears, wax buildup, runny nose, nasal congestion, bloody nose, or sore throat. Respiratory: Denies difficulty breathing, shortness of breath, cough or sputum production.   Cardiovascular: Denies chest pain, chest tightness, palpitations or swelling in the hands or feet.  Gastrointestinal: Patient reports increased thirst.  Denies abdominal pain, bloating, constipation, diarrhea or blood in the stool.  GU: Patient reports urinary frequency and urgency.  Denies pain with urination, burning sensation, blood in urine, odor or discharge. Musculoskeletal: Denies decrease in range of motion, difficulty with gait, muscle pain or joint pain and swelling.  Skin: Denies redness, rashes, lesions or ulcercations.  Neurological: Patient reports intermittent lightheadedness.  Denies difficulty with memory, difficulty with speech or problems with balance and coordination.  Psych: Patient has a history of anxiety and depression.  Denies SI/HI.  No other specific complaints in a complete review of systems (except as listed in HPI above).  Objective:   Physical Exam   BP (!) 136/94   Pulse 83   Temp 98.5 F (36.9 C) (Temporal)  Wt Readings from Last 3 Encounters:  01/07/20 234 lb (106.1 kg)  11/10/19 230 lb (104.3 kg)   06/08/19 234 lb (106.1 kg)    General: Appears her stated age, obese, in NAD. Skin: Warm, dry and intact. No ulcerations noted. HEENT: Head: normal shape and size; Eyes: sclera white, no icterus, conjunctiva pink, PERRLA and EOMs intact; Ears: Tm's gray and intact, normal light reflex, no effusions noted;  Neck:  Neck supple, trachea midline. No masses, lumps present.  Cardiovascular: Normal rate and rhythm. S1,S2 noted.  No murmur, rubs or gallops noted. No JVD or BLE edema. No carotid bruits noted. Pulmonary/Chest: Normal effort and positive vesicular breath sounds. No respiratory distress. No wheezes, rales or ronchi noted.  Neurological: Alert and oriented. Coordination normal.  Psychiatric: Mood and affect normal. Behavior is normal.  Judgment and thought content normal.    BMET    Component Value Date/Time   NA 137 03/25/2020 0749   K 4.4 03/25/2020 0749   CL 101 03/25/2020 0749   CO2 28 03/25/2020 0749   GLUCOSE 129 (H) 03/25/2020 0749   BUN 15 03/25/2020 0749   CREATININE 0.81 03/25/2020 0749   CALCIUM 9.0 03/25/2020 0749    Lipid Panel     Component Value Date/Time   CHOL 155 03/25/2020 0749   TRIG 107.0 03/25/2020 0749   HDL 50.60 03/25/2020 0749   CHOLHDL 3 03/25/2020 0749   VLDL 21.4 03/25/2020 0749   LDLCALC 83 03/25/2020 0749    CBC    Component Value Date/Time   WBC 9.6 11/10/2019 1643   RBC 4.43 11/10/2019 1643   HGB 13.3 11/10/2019 1643   HCT 40.5 11/10/2019 1643   PLT 188.0 11/10/2019 1643   MCV 91.5 11/10/2019 1643   MCHC 32.9 11/10/2019 1643   RDW 14.6 11/10/2019 1643    Hgb A1C Lab Results  Component Value Date   HGBA1C 6.1 (A) 12/19/2020           Assessment & Plan:   Fatigue: Lightheadedness, Hypertension, OSA, Hypothyroidism:  Encourage adequate water intake Advised her to see if this seems related to position changes of the head Handout given on BPPV Continue Lisinopril as prescribed We will check TSH, free T4, vitamin D  and B12 Advised her if labs were normal today that I would encourage her to get her CPAP machine and start wearing this  Increased Thirst, Blurred Vision, Urinary Frequency, Urinary Urgency, Cold Intolerance:  Urinalysis normal We will check A1c, TSH and free T4 today Encouraged low-carb diet and exercise for weight loss Consider use of Saxenda to aid in blood sugar control and weight loss Encouraged her to see her eye doctor for eye exam to make sure that cataracts were not returning We will adjust levothyroxine if needed based on labs  Need for Hep C Screening:  Hep C antibody today

## 2020-12-20 ENCOUNTER — Ambulatory Visit: Payer: Federal, State, Local not specified - PPO | Admitting: Internal Medicine

## 2020-12-20 ENCOUNTER — Telehealth: Payer: Self-pay | Admitting: Internal Medicine

## 2020-12-20 LAB — HEPATITIS C ANTIBODY
Hepatitis C Ab: NONREACTIVE
SIGNAL TO CUT-OFF: 0.02 (ref <1.00)

## 2020-12-20 NOTE — Telephone Encounter (Signed)
Called and spoke to patient, who is requesting order for cpap. She stated that she was never setup on cpap after sleep study.  Patient last seen in 2019. She is aware that an appt is needed prior to ordering cpap.  appt scheduled for 01/06/2021 at 4:00. Nothing further needed.

## 2020-12-22 ENCOUNTER — Encounter: Payer: Self-pay | Admitting: Internal Medicine

## 2020-12-22 DIAGNOSIS — E559 Vitamin D deficiency, unspecified: Secondary | ICD-10-CM

## 2020-12-22 DIAGNOSIS — E538 Deficiency of other specified B group vitamins: Secondary | ICD-10-CM

## 2021-01-01 ENCOUNTER — Other Ambulatory Visit: Payer: Self-pay

## 2021-01-01 ENCOUNTER — Emergency Department
Admission: EM | Admit: 2021-01-01 | Discharge: 2021-01-01 | Disposition: A | Discharge status: Home / Self Care | Payer: Federal, State, Local not specified - PPO | Attending: Emergency Medicine | Admitting: Emergency Medicine

## 2021-01-01 ENCOUNTER — Emergency Department: Payer: Federal, State, Local not specified - PPO

## 2021-01-01 DIAGNOSIS — D251 Intramural leiomyoma of uterus: Secondary | ICD-10-CM | POA: Diagnosis not present

## 2021-01-01 DIAGNOSIS — Z20822 Contact with and (suspected) exposure to covid-19: Secondary | ICD-10-CM | POA: Insufficient documentation

## 2021-01-01 DIAGNOSIS — R9389 Abnormal findings on diagnostic imaging of other specified body structures: Secondary | ICD-10-CM

## 2021-01-01 DIAGNOSIS — U071 COVID-19: Secondary | ICD-10-CM

## 2021-01-01 DIAGNOSIS — Z79899 Other long term (current) drug therapy: Secondary | ICD-10-CM | POA: Diagnosis not present

## 2021-01-01 DIAGNOSIS — D259 Leiomyoma of uterus, unspecified: Secondary | ICD-10-CM | POA: Diagnosis not present

## 2021-01-01 DIAGNOSIS — R1031 Right lower quadrant pain: Secondary | ICD-10-CM

## 2021-01-01 DIAGNOSIS — I10 Essential (primary) hypertension: Secondary | ICD-10-CM | POA: Insufficient documentation

## 2021-01-01 DIAGNOSIS — N8500 Endometrial hyperplasia, unspecified: Secondary | ICD-10-CM | POA: Diagnosis not present

## 2021-01-01 DIAGNOSIS — K760 Fatty (change of) liver, not elsewhere classified: Secondary | ICD-10-CM | POA: Diagnosis not present

## 2021-01-01 DIAGNOSIS — S301XXA Contusion of abdominal wall, initial encounter: Secondary | ICD-10-CM

## 2021-01-01 DIAGNOSIS — Y33XXXA Other specified events, undetermined intent, initial encounter: Secondary | ICD-10-CM | POA: Insufficient documentation

## 2021-01-01 DIAGNOSIS — E039 Hypothyroidism, unspecified: Secondary | ICD-10-CM | POA: Diagnosis not present

## 2021-01-01 LAB — COMPREHENSIVE METABOLIC PANEL
ALT: 17 U/L (ref 0–44)
AST: 17 U/L (ref 15–41)
Albumin: 3.7 g/dL (ref 3.5–5.0)
Alkaline Phosphatase: 63 U/L (ref 38–126)
Anion gap: 13 (ref 5–15)
BUN: 15 mg/dL (ref 6–20)
CO2: 25 mmol/L (ref 22–32)
Calcium: 9 mg/dL (ref 8.9–10.3)
Chloride: 103 mmol/L (ref 98–111)
Creatinine, Ser: 0.72 mg/dL (ref 0.44–1.00)
GFR, Estimated: >60 mL/min (ref >60)
Glucose, Bld: 94 mg/dL (ref 70–99)
Potassium: 3.5 mmol/L (ref 3.5–5.1)
Sodium: 141 mmol/L (ref 135–145)
Total Bilirubin: 0.7 mg/dL (ref 0.3–1.2)
Total Protein: 7 g/dL (ref 6.5–8.1)

## 2021-01-01 LAB — URINALYSIS, COMPLETE (UACMP) WITH MICROSCOPIC
Bilirubin Urine: NEGATIVE
Glucose, UA: NEGATIVE mg/dL
Ketones, ur: NEGATIVE mg/dL
Leukocytes,Ua: NEGATIVE
Nitrite: NEGATIVE
Protein, ur: NEGATIVE mg/dL
Specific Gravity, Urine: 1.018 (ref 1.005–1.030)
pH: 7 (ref 5.0–8.0)

## 2021-01-01 LAB — CBC
HCT: 40.5 % (ref 36.0–46.0)
Hemoglobin: 12.8 g/dL (ref 12.0–15.0)
MCH: 29 pg (ref 26.0–34.0)
MCHC: 31.6 g/dL (ref 30.0–36.0)
MCV: 91.6 fL (ref 80.0–100.0)
Platelets: 176 10*3/uL (ref 150–400)
RBC: 4.42 MIL/uL (ref 3.87–5.11)
RDW: 13.8 % (ref 11.5–15.5)
WBC: 7 10*3/uL (ref 4.0–10.5)
nRBC: 0 % (ref 0.0–0.2)

## 2021-01-01 LAB — PREGNANCY, URINE: Preg Test, Ur: NEGATIVE

## 2021-01-01 LAB — LIPASE, BLOOD: Lipase: 29 U/L (ref 11–51)

## 2021-01-01 MED ORDER — IOHEXOL 300 MG/ML  SOLN
100.0000 mL | Freq: Once | INTRAMUSCULAR | Status: AC | PRN
  Administered 2021-01-01: 100 mL via INTRAVENOUS
  Filled 2021-01-01: qty 100

## 2021-01-01 NOTE — ED Provider Notes (Signed)
[facility] Emergency Department Provider Note  ____________________________________________  Time seen: Approximately 11:59 PM  I have reviewed the triage vital signs and the nursing notes.   HISTORY  Chief Complaint Abdominal Pain    HPI Anne Mcguire is a 50 y.o. female that presents to the emergency department for evaluation of right lower quadrant pain for 4 days.  Patient became symptomatic of COVID 1 week ago and tested positive on Friday.  Her COVID symptoms overall have been improving.  She was coughing and vomiting a lot at the beginning but is not any longer.  She has not had any recent fevers.  She did have some slight vaginal bleeding this morning that she is unsure if it is related to her menstrual cycle.  She does still have fairly regular menstrual cycles, however she cannot recall her last cycle.  No diarrhea, vaginal discharge, urinary symptoms.  Past Medical History:  Diagnosis Date  . Anxiety   . Borderline hypertension    situational--  anxiety  . Depression   . GERD (gastroesophageal reflux disease)   . History of peptic ulcer    several years ago  . Hyperlipidemia   . Hypertension   . Hypothyroidism   . Menorrhagia   . Migraines   . Nocturia   . Wears glasses     Patient Active Problem List   Diagnosis Date Noted  . HLD (hyperlipidemia) 08/26/2017  . Migraines 08/26/2017  . Essential hypertension 01/28/2017  . Insomnia 04/02/2014  . Anxiety and depression 02/19/2014  . Hypothyroidism 02/19/2014  . GERD (gastroesophageal reflux disease) 02/19/2014    Past Surgical History:  Procedure Laterality Date  . CATARACT EXTRACTION, BILATERAL  07/11/2017  . DILITATION & CURRETTAGE/HYSTROSCOPY WITH NOVASURE ABLATION N/A 01/10/2017   Procedure: DILATATION & CURETTAGE/HYSTEROSCOPY WITH NOVASURE ABLATION;  Surgeon: [name], DO;  Location: [place];  Service: Gynecology;  Laterality: N/A;  . LAPAROSCOPIC TUBAL  LIGATION Bilateral 01/10/2017   Procedure: LAPAROSCOPIC TUBAL LIGATION with Filshie Clips;  Surgeon: [name], DO;  Location: [place];  Service: Gynecology;  Laterality: Bilateral;  . [place] EXTRACTION  2013    Prior to Admission medications   Medication Sig Start Date End Date Taking? Authorizing Provider  atorvastatin (LIPITOR) 10 MG tablet TAKE 1 TABLET BY MOUTH ONCE A DAY *SCHEDULE PHYSICAL EXAM* 11/09/20   Jearld Fenton, NP  buPROPion (WELLBUTRIN SR) 150 MG 12 hr tablet Take 1 tablet (150 mg total) by mouth daily. 11/10/19   Jearld Fenton, NP  FLUoxetine (PROZAC) 40 MG capsule TAKE 1 CAPSULE BY MOUTH DAILY 12/01/20   Jearld Fenton, NP  hydrOXYzine (ATARAX/VISTARIL) 10 MG tablet TAKE ONE TABLET BY MOUTH DAILY AS NEEDED 12/01/20   Jearld Fenton, NP  levothyroxine (SYNTHROID) 100 MCG tablet TAKE 1 TAB BY MOUTH ONCE DAILY. TAKE ON AN EMPTY STOMACH WITH A GLASS OF WATER ATLEAST 30-60 MINUTES BEFORE BREAKFAST 11/09/20   Jearld Fenton, NP  lisinopril (ZESTRIL) 20 MG tablet TAKE 1 TABLET BY MOUTH ONCE DAILY 11/09/20   Jearld Fenton, NP  meloxicam (MOBIC) 15 MG tablet Take 1 tablet (15 mg total) by mouth daily. 09/05/20   Edrick Kins, DPM  omeprazole (PRILOSEC) 20 MG capsule TAKE 1 CAPSULE BY MOUTH ONCE DAILY. [place] PHYSICAL 01/20/20   Jearld Fenton, NP    Allergies Azithromycin and Eggs or egg-derived products  Family History  Problem Relation Age of Onset  . Mental illness Mother   .  Arthritis Father   . Hyperlipidemia Father   . Hypertension Father   . Diabetes Father   . Arthritis Maternal Grandmother   . Diabetes Maternal Grandmother   . Arthritis Maternal Grandfather   . Diabetes Maternal Grandfather   . Arthritis Paternal Grandmother   . Breast cancer Paternal Grandmother     Social History Social History   Tobacco Use  . Smoking status: Never Smoker  . Smokeless tobacco: Never Used  Substance Use Topics  . Alcohol use: Yes     Comment: occasional  . Drug use: No     Review of Systems  Constitutional: No fever/chills Cardiovascular: No chest pain. Respiratory: No cough. No SOB. Gastrointestinal: Positive for abdominal pain.  No nausea, no vomiting.  Musculoskeletal: Negative for musculoskeletal pain. Skin: Negative for rash, abrasions, lacerations, ecchymosis. Neurological: Negative for headaches   ____________________________________________   PHYSICAL EXAM:  VITAL SIGNS: ED Triage Vitals [01/01/21 1643]  Enc Vitals Group     BP (!) 194/105     Pulse Rate 94     Resp 18     Temp 98.4 F (36.9 C)     Temp Source Oral     SpO2 97 %     Weight 245 lb (111.1 kg)     Height 5\' 4"  (1.626 m)     Head Circumference      Peak Flow      Pain Score 7     Pain Loc      Pain Edu?      Excl. in [place]?      Constitutional: Alert and oriented. Well appearing and in no acute distress. Eyes: Conjunctivae are normal. PERRL. EOMI. Head: Atraumatic. ENT:      Ears:      Nose: No congestion/rhinnorhea.      Mouth/Throat: Mucous membranes are moist.  Neck: No stridor.   Cardiovascular: Normal rate, regular rhythm.  Good peripheral circulation. Respiratory: Normal respiratory effort without tachypnea or retractions. Lungs CTAB. Good air entry to the bases with no decreased or absent breath sounds. Gastrointestinal: Bowel sounds 4 quadrants.  Mild right lower quadrant tenderness to palpation.  No guarding or rigidity. No palpable masses. No distention.  Musculoskeletal: Full range of motion to all extremities. No gross deformities appreciated. Neurologic:  Normal speech and language. No gross focal neurologic deficits are appreciated.  Skin:  Skin is warm, dry and intact. No rash noted. Psychiatric: Mood and affect are normal. Speech and behavior are normal. Patient exhibits appropriate insight and judgement.   ____________________________________________   LABS (all labs ordered are listed, but only  abnormal results are displayed)  Labs Reviewed  URINALYSIS, COMPLETE (UACMP) WITH MICROSCOPIC - Abnormal; Notable for the following components:      Result Value   Color, Urine YELLOW (*)    APPearance HAZY (*)    Hgb urine dipstick LARGE (*)    Bacteria, UA RARE (*)    All other components within normal limits  LIPASE, BLOOD  COMPREHENSIVE METABOLIC PANEL  CBC  PREGNANCY, URINE  POC URINE PREG, ED   ____________________________________________  EKG   ____________________________________________  RADIOLOGY Robinette Haines, personally viewed and evaluated these images (plain radiographs) as part of my medical decision making, as well as reviewing the written report by the radiologist.  CT ABDOMEN PELVIS W CONTRAST  Result Date: 01/01/2021 CLINICAL DATA:  50 year old female with right lower quadrant abdominal pain. EXAM: CT ABDOMEN AND PELVIS WITH CONTRAST TECHNIQUE: Multidetector CT imaging of the abdomen and  pelvis was performed using the standard protocol following bolus administration of intravenous contrast. CONTRAST:  116mL OMNIPAQUE IOHEXOL 300 MG/ML  SOLN COMPARISON:  None. FINDINGS: Lower chest: The visualized lung bases are clear. No intra-abdominal free air or free fluid. Hepatobiliary: Fatty liver. No intrahepatic biliary dilatation. The gallbladder is unremarkable. Pancreas: Unremarkable. No pancreatic ductal dilatation or surrounding inflammatory changes. Spleen: Normal in size without focal abnormality. Adrenals/Urinary Tract: The adrenal glands unremarkable. The kidneys, visualized ureters, and urinary bladder appear unremarkable. Stomach/Bowel: There is no bowel obstruction or active inflammation. The appendix is normal. Vascular/Lymphatic: The abdominal aorta and IVC unremarkable. No portal venous gas. There is no adenopathy. Reproductive: The uterus is anteverted and grossly unremarkable. Bilateral tubal ligation clips noted. No adnexal masses. Other: Right rectus sheath  hematoma measuring approximately 3.4 x 2.6 cm in greatest axial dimensions and 3.7 cm in craniocaudal length. Musculoskeletal: Small fat containing umbilical hernia. No acute osseous pathology. IMPRESSION: 1. Right rectus sheath hematoma. 2. No bowel obstruction. Normal appendix. 3. Fatty liver. Electronically Signed   By: Anner Crete M.D.   On: 01/01/2021 22:33   US PELVIC COMPLETE W TRANSVAGINAL AND TORSION R/O  Result Date: 01/01/2021 CLINICAL DATA:  Initial evaluation for acute right upper quadrant pain, vaginal bleeding. EXAM: TRANSABDOMINAL AND TRANSVAGINAL ULTRASOUND OF PELVIS DOPPLER ULTRASOUND OF OVARIES TECHNIQUE: Both transabdominal and transvaginal ultrasound examinations of the pelvis were performed. Transabdominal technique was performed for global imaging of the pelvis including uterus, ovaries, adnexal regions, and pelvic cul-de-sac. It was necessary to proceed with endovaginal exam following the transabdominal exam to visualize the uterus, endometrium, and ovaries. Color and duplex Doppler ultrasound was utilized to evaluate blood flow to the ovaries. COMPARISON:  None. FINDINGS: Uterus Measurements: 7.5 x 3.6 x 4.5 cm = volume: 65 mL. Uterus is anteverted. 0.8 x 0.5 x 0.6 cm intramural fibroid present at the anterior uterine body. 0.8 x 0.7 x 0.8 cm intramural fibroid present at the anterior uterine fundus. Additional 1.2 x 1.2 x 1.1 cm intramural fibroid present at the right posterior uterine fundus. Endometrium Thickness: 6.7 mm. Endometrial stripe somewhat ill-defined without focal abnormality. Scattered areas of internal vascularity seen within the endometrial complex (image 40). Right ovary Measurements: 2.6 x 1.3 x 1.6 cm = volume: 2.7 mL. Normal appearance/no adnexal mass. Left ovary Measurements: 2.0 x 1.3 x 1.4 cm = volume: 1.9 mL. Normal appearance/no adnexal mass. Pulsed Doppler evaluation of both ovaries demonstrates normal low-resistance arterial and venous waveforms. Other  findings No abnormal free fluid. IMPRESSION: 1. Ill-defined endometrial stripe measuring up to 6.7 mm with associated vascularity. Findings are nonspecific, but could reflect changes of endometrial hyperplasia. Endometrial carcinoma is not excluded. Further evaluation with dedicated sonohysterography or hysteroscopy with possible endometrial sampling recommended. 2. Underlying fibroid uterus as above. 3. Otherwise negative and normal pelvic ultrasound. No evidence for ovarian torsion or other acute abnormality. Electronically Signed   By: Jeannine Boga M.D.   On: 01/01/2021 21:51    ____________________________________________    PROCEDURES  Procedure(s) performed:    Procedures    Medications  iohexol (OMNIPAQUE) 300 MG/ML solution 100 mL (100 mLs Intravenous Contrast Given 01/01/21 2218)     ____________________________________________   INITIAL IMPRESSION / ASSESSMENT AND PLAN / ED COURSE  Pertinent labs & imaging results that were available during my care of the patient were reviewed by me and considered in my medical decision making (see chart for details).  Review of the [place] CSRS was performed in accordance of the [place] prior  to dispensing any controlled drugs.    Patient presented to emergency department for evaluation of right lower quadrant pain for 4 days and COVID for 7 days.  Vital signs and exam are reassuring.  Patient's COVID symptoms have been improving over the course of the last week.  Given patient's slight vaginal bleeding this morning, ultrasound was initially ordered to evaluate.  Ultrasound shows a thickened endometrial stripe with a fibrous uterus.  These do not explain patient's symptoms so a CT scan was ordered to further evaluate.  Bleeding this morning is likely due to her menstrual cycle.  CT scan shows a right rectus sheath hematoma.  Patient likely developed this from coughing and vomiting last week due to COVID-19.  Scans were discussed with Dr.  Joni Fears, who is in agreement with plan of care for outpatient follow-up.  Patient will follow-up with gynecology for her GYN findings.  She understands that she needs further imaging to rule out cancerous causes of a thickened endometrial stripe.  Patient is to follow up with gynecology and primary care as directed. Patient is given ED precautions to return to the ED for any worsening or new symptoms.   ADHIRA KELLAMS was evaluated in Emergency Department on 01/02/2021 for the symptoms described in the history of present illness. She was evaluated in the context of the global COVID-19 pandemic, which necessitated consideration that the patient might be at risk for infection with the SARS-CoV-2 virus that causes COVID-19. Institutional protocols and algorithms that pertain to the evaluation of patients at risk for COVID-19 are in a state of rapid change based on information released by regulatory bodies including the CDC and federal and state organizations. These policies and algorithms were followed during the patient's care in the ED.  ____________________________________________  FINAL CLINICAL IMPRESSION(S) / ED DIAGNOSES  Final diagnoses:  Right lower quadrant abdominal pain  Hematoma of rectus sheath, initial encounter  Increased endometrial stripe  Uterine leiomyoma, unspecified location  COVID      NEW MEDICATIONS STARTED DURING THIS VISIT:  ED Discharge Orders    None          This chart was dictated using voice recognition software/Dragon. Despite best efforts to proofread, errors can occur which can change the meaning. Any change was purely unintentional.    Laban Emperor, PA-C 01/02/21 0003    Carrie Mew, MD 01/02/21 2340

## 2021-01-01 NOTE — ED Triage Notes (Addendum)
Pt comes pov with right sided lower abd pain. Dx with covid yesterday but states her RLQ pain is increasing with each day. Pt also states she has had vaginal bleeding. Hx of cysts rupturing and states it feels similar.

## 2021-01-01 NOTE — Discharge Instructions (Addendum)
Please follow up with gynecology for your uterine fibroids and the increased endometrial stipe of your uterus.  You will need some additional tests to be sure there is no chance of cancer.  Your CT scan shows a large bruise in your stomach, which is likely what is causing your pain, likely from your coughing and vomiting during COVID.  Please call primary care tomorrow for a follow-up appointment.

## 2021-01-03 ENCOUNTER — Encounter: Payer: Self-pay | Admitting: Internal Medicine

## 2021-01-04 MED ORDER — VITAMIN D (ERGOCALCIFEROL) 1.25 MG (50000 UNIT) PO CAPS: 50000.0000 [IU] | ORAL_CAPSULE | ORAL | 0 refills | Status: DC

## 2021-01-05 ENCOUNTER — Other Ambulatory Visit: Payer: Self-pay | Admitting: Internal Medicine

## 2021-01-05 DIAGNOSIS — F419 Anxiety disorder, unspecified: Secondary | ICD-10-CM

## 2021-01-05 DIAGNOSIS — F32A Depression, unspecified: Secondary | ICD-10-CM

## 2021-01-05 DIAGNOSIS — K219 Gastro-esophageal reflux disease without esophagitis: Secondary | ICD-10-CM

## 2021-01-06 ENCOUNTER — Ambulatory Visit: Payer: Federal, State, Local not specified - PPO | Admitting: Pulmonary Disease

## 2021-01-11 ENCOUNTER — Other Ambulatory Visit: Payer: Self-pay | Admitting: Podiatry

## 2021-01-11 ENCOUNTER — Other Ambulatory Visit: Payer: Self-pay | Admitting: Internal Medicine

## 2021-01-11 NOTE — Telephone Encounter (Signed)
Please advise 

## 2021-01-17 DIAGNOSIS — N920 Excessive and frequent menstruation with regular cycle: Secondary | ICD-10-CM | POA: Diagnosis not present

## 2021-01-17 DIAGNOSIS — N946 Dysmenorrhea, unspecified: Secondary | ICD-10-CM | POA: Diagnosis not present

## 2021-01-18 ENCOUNTER — Other Ambulatory Visit: Payer: Self-pay

## 2021-01-18 ENCOUNTER — Ambulatory Visit: Payer: Federal, State, Local not specified - PPO | Admitting: Primary Care

## 2021-01-18 ENCOUNTER — Encounter: Payer: Self-pay | Admitting: Primary Care

## 2021-01-18 VITALS — BP 128/76 | HR 91 | Temp 97.3°F | Ht 64.0 in | Wt 247.6 lb

## 2021-01-18 DIAGNOSIS — G4730 Sleep apnea, unspecified: Secondary | ICD-10-CM

## 2021-01-18 NOTE — Patient Instructions (Addendum)
Recommendations: Aim to wear CPAP every night for minimum 4-6 hours or longer  Do not drive if experiencing excessive daytime fatigue/somolence   Orders: Cpap 5-15cm h20, mask of choice, humidification, supplies and enroll in airview  Follow-up 6 months with Dr. Mortimer Fries or APP    CPAP and BPAP Information CPAP and BPAP are methods that use air pressure to keep your airways open and to help you breathe well. CPAP and BPAP use different amounts of pressure. Your health care provider will tell you whether CPAP or BPAP would be more helpful for you.  CPAP stands for "continuous positive airway pressure." With CPAP, the amount of pressure stays the same while you breathe in and out.  BPAP stands for "bi-level positive airway pressure." With BPAP, the amount of pressure will be higher when you breathe in (inhale) and lower when you breathe out(exhale). This allows you to take larger breaths. CPAP or BPAP may be used in the hospital, or your health care provider may want you to use it at home. You may need to have a sleep study before your health care provider can order a machine for you to use at home. Why are CPAP and BPAP treatments used? CPAP or BPAP can be helpful if you have:  Sleep apnea.  Chronic obstructive pulmonary disease (COPD).  Heart failure.  Medical conditions that cause muscle weakness, including muscular dystrophy or amyotrophic lateral sclerosis (ALS).  Other problems that cause breathing to be shallow, weak, abnormal, or difficult. CPAP and BPAP are most commonly used for obstructive sleep apnea (OSA) to keep the airways from collapsing when the muscles relax during sleep. How is CPAP or BPAP administered? Both CPAP and BPAP are provided by a small machine with a flexible plastic tube that attaches to a plastic mask that you wear. Air is blown through the mask into your nose or mouth. The amount of pressure that is used to blow the air can be adjusted on the machine. Your  health care provider will set the pressure setting and help you find the best mask for you. When should CPAP or BPAP be used? In most cases, the mask only needs to be worn during sleep. Generally, the mask needs to be worn throughout the night and during any daytime naps. People with certain medical conditions may also need to wear the mask at other times when they are awake. Follow instructions from your health care provider about when to use the machine. What are some tips for using the mask?  Because the mask needs to be snug, some people feel trapped or closed-in (claustrophobic) when first using the mask. If you feel this way, you may need to get used to the mask. One way to do this is to hold the mask loosely over your nose or mouth and then gradually apply the mask more snugly. You can also gradually increase the amount of time that you use the mask.  Masks are available in various types and sizes. If your mask does not fit well, talk with your health care provider about getting a different one. Some common types of masks include: ? Full face masks, which fit over the mouth and nose. ? Nasal masks, which fit over the nose. ? Nasal pillow or prong masks, which fit into the nostrils.  If you are using a mask that fits over your nose and you tend to breathe through your mouth, a chin strap may be applied to help keep your mouth closed.  Some CPAP and BPAP machines have alarms that may sound if the mask comes off or develops a leak.  If you have trouble with the mask, it is very important that you talk with your health care provider about finding a way to make the mask easier to tolerate. Do not stop using the mask. There could be a negative impact to your health if you stop using the mask.   What are some tips for using the machine?  Place your CPAP or BPAP machine on a secure table or stand near an electrical outlet.  Know where the on/off switch is on the machine.  Follow instructions  from your health care provider about how to set the pressure on your machine and when you should use it.  Do not eat or drink while the CPAP or BPAP machine is on. Food or fluids could get pushed into your lungs by the pressure of the CPAP or BPAP.  For home use, CPAP and BPAP machines can be rented or purchased through home health care companies. Many different brands of machines are available. Renting a machine before purchasing may help you find out which particular machine works well for you. Your insurance may also decide which machine you may get.  Keep the CPAP or BPAP machine and attachments clean. Ask your health care provider for specific instructions. Follow these instructions at home:  Do not use any products that contain nicotine or tobacco, such as cigarettes, e-cigarettes, and chewing tobacco. If you need help quitting, ask your health care provider.  Keep all follow-up visits as told by your health care provider. This is important. Contact a health care provider if:  You have redness or pressure sores on your head, face, mouth, or nose from the mask or head gear.  You have trouble using the CPAP or BPAP machine.  You cannot tolerate wearing the CPAP or BPAP mask.  Someone tells you that you snore even when wearing your CPAP or BPAP. Get help right away if:  You have trouble breathing.  You feel confused. Summary  CPAP and BPAP are methods that use air pressure to keep your airways open and to help you breathe well.  You may need to have a sleep study before your health care provider can order a machine for home use.  If you have trouble with the mask, it is very important that you talk with your health care provider about finding a way to make the mask easier to tolerate. Do not stop using the mask. There could be a negative impact to your health if you stop using the mask.  Follow instructions from your health care provider about when to use the machine. This  information is not intended to replace advice given to you by your health care provider. Make sure you discuss any questions you have with your health care provider. Document Revised: 12/18/2019 Document Reviewed: 12/21/2019 Elsevier Patient Education  2021 [employer].

## 2021-01-18 NOTE — Assessment & Plan Note (Signed)
-   Split-night sleep study in September 2019 showed severe obstructive sleep apnea, AHI was 139 with SPO2 O2 low in the 70s.  Events were eliminated at CPAP pressure of 9 cm h20. She was never started on CPAP due to cost.  - Patient continues to have symptoms of loud snoring, restless sleep and daytime sleepiness. Placing order for auto CPAP 5-15cm h20. Advised patient to aim to wear every night for 4-6 hours or longer. FU in 4-6 months.

## 2021-01-18 NOTE — Progress Notes (Signed)
@Patient  ID: Anne Mcguire, female    DOB: 07-Aug-1971, 50 y.o.   MRN: [id number]  Chief Complaint  Patient presents with  . Follow-up    Sleep study 2019--no cpap. C/o loud snoring, daytime sleepiness and restless sleep.     Referring provider: Jearld Fenton, NP  HPI: 50 year old female, smoked.  Past medical history sniffing for severe obstructive sleep apnea, pretension, migraines, GERD, hypothyroidism, hyperlipidemia.  Patient of Dr. Mortimer Fries last seen in August 2019 for sleep consult.  01/18/2021 Patient presents today for overdue follow-up OSA. She had a split-night sleep study in September 2019 that showed severe obstructive sleep apnea, AHI was 139 with SPO2 O2 low in the 70s.  Events were eliminated at CPAP pressure of 9 cm H2O.  At that time she was unable to afford CPAP due to paying off sleep study.  She continues to have symptoms loud snoring, daytime sleepiness and restless sleep.  Her weight is up 10 pounds since last visit. She goes to bed between 10:30-11pm. No issues falling asleep, she has issues staying asleep. States that she often sleeps better in recliner. She finds herself gasping for air at night and falls asleep at her desk at work. Denies narcolepsy or cataplexy.    Allergies  Allergen Reactions  . Azithromycin Hives  . Eggs Or Egg-Derived Products Itching and Rash    THROAT ITCH    Immunization History  Administered Date(s) Administered  . Influenza,inj,Quad PF,6+ Mos 10/04/2014, 09/10/2015, 10/23/2016, 10/17/2018, 11/10/2019  . Influenza-Unspecified 10/10/2013, 11/21/2020  . PFIZER(Purple Top)SARS-COV-2 Vaccination 02/12/2020, 03/11/2020  . Tdap 09/01/2012    Past Medical History:  Diagnosis Date  . Anxiety   . Borderline hypertension    situational--  anxiety  . Depression   . GERD (gastroesophageal reflux disease)   . History of peptic ulcer    several years ago  . Hyperlipidemia   . Hypertension   . Hypothyroidism   . Menorrhagia   .  Migraines   . Nocturia   . Wears glasses     Tobacco History: Social History   Tobacco Use  Smoking Status Never Smoker  Smokeless Tobacco Never Used   Counseling given: Not Answered   Outpatient Medications Prior to Visit  Medication Sig Dispense Refill  . atorvastatin (LIPITOR) 10 MG tablet TAKE 1 TABLET BY MOUTH ONCE A DAY *SCHEDULE PHYSICAL EXAM* 30 tablet 0  . buPROPion (WELLBUTRIN SR) 150 MG 12 hr tablet TAKE 1 TABLET BY MOUTH DAILY 30 tablet 0  . hydrOXYzine (ATARAX/VISTARIL) 10 MG tablet TAKE ONE TABLET BY MOUTH DAILY AS NEEDED 30 tablet 0  . levothyroxine (SYNTHROID) 100 MCG tablet TAKE 1 TAB BY MOUTH ONCE DAILY. TAKE ON AN EMPTY STOMACH WITH A GLASS OF WATER ATLEAST 30-60 MINUTES BEFORE BREAKFAST 30 tablet 0  . lisinopril (ZESTRIL) 20 MG tablet TAKE 1 TABLET BY MOUTH ONCE DAILY 30 tablet 0  . meloxicam (MOBIC) 15 MG tablet Take 1 tablet (15 mg total) by mouth daily. 30 tablet 1  . omeprazole (PRILOSEC) 20 MG capsule TAKE 1 CAPSULE BY MOUTH ONCE DAILY 30 capsule 0  . Vitamin D, Ergocalciferol, (DRISDOL) 1.25 MG (50000 UNIT) CAPS capsule Take 1 capsule (50,000 Units total) by mouth every 7 (seven) days. 12 capsule 0  . FLUoxetine (PROZAC) 40 MG capsule TAKE 1 CAPSULE BY MOUTH DAILY 30 capsule 0   No facility-administered medications prior to visit.   Review of Systems  Review of Systems  Constitutional: Positive for fatigue.  Respiratory: Negative.  Psychiatric/Behavioral: Positive for sleep disturbance.   Physical Exam  BP 128/76 (BP Location: Left Arm, Cuff Size: Normal)   Pulse 91   Temp (!) 97.3 F (36.3 C) (Temporal)   Ht 5\' 4"  (1.626 m)   Wt 247 lb 9.6 oz (112.3 kg)   SpO2 97%   BMI 42.50 kg/m  Physical Exam Constitutional:      General: She is not in acute distress.    Appearance: Normal appearance. She is not ill-appearing.  HENT:     Head: Normocephalic and atraumatic.     Mouth/Throat:     Mouth: Mucous membranes are moist.     Pharynx:  Oropharynx is clear.  Cardiovascular:     Rate and Rhythm: Normal rate and regular rhythm.  Pulmonary:     Effort: Pulmonary effort is normal.     Breath sounds: Normal breath sounds.  Musculoskeletal:        General: Normal range of motion.  Skin:    General: Skin is warm and dry.  Neurological:     General: No focal deficit present.     Mental Status: She is alert and oriented to person, place, and time. Mental status is at baseline.  Psychiatric:        Mood and Affect: Mood normal.        Behavior: Behavior normal.        Thought Content: Thought content normal.        Judgment: Judgment normal.      Lab Results:  CBC    Component Value Date/Time   WBC 7.0 01/01/2021 1645   RBC 4.42 01/01/2021 1645   HGB 12.8 01/01/2021 1645   HCT 40.5 01/01/2021 1645   PLT 176 01/01/2021 1645   MCV 91.6 01/01/2021 1645   MCH 29.0 01/01/2021 1645   MCHC 31.6 01/01/2021 1645   RDW 13.8 01/01/2021 1645    BMET    Component Value Date/Time   NA 141 01/01/2021 1645   K 3.5 01/01/2021 1645   CL 103 01/01/2021 1645   CO2 25 01/01/2021 1645   GLUCOSE 94 01/01/2021 1645   BUN 15 01/01/2021 1645   CREATININE 0.72 01/01/2021 1645   CALCIUM 9.0 01/01/2021 1645   GFRNONAA >60 01/01/2021 1645    BNP No results found for: BNP  ProBNP No results found for: PROBNP  Imaging: CT ABDOMEN PELVIS W CONTRAST  Result Date: 01/01/2021 CLINICAL DATA:  50 year old female with right lower quadrant abdominal pain. EXAM: CT ABDOMEN AND PELVIS WITH CONTRAST TECHNIQUE: Multidetector CT imaging of the abdomen and pelvis was performed using the standard protocol following bolus administration of intravenous contrast. CONTRAST:  181mL OMNIPAQUE IOHEXOL 300 MG/ML  SOLN COMPARISON:  None. FINDINGS: Lower chest: The visualized lung bases are clear. No intra-abdominal free air or free fluid. Hepatobiliary: Fatty liver. No intrahepatic biliary dilatation. The gallbladder is unremarkable. Pancreas:  Unremarkable. No pancreatic ductal dilatation or surrounding inflammatory changes. Spleen: Normal in size without focal abnormality. Adrenals/Urinary Tract: The adrenal glands unremarkable. The kidneys, visualized ureters, and urinary bladder appear unremarkable. Stomach/Bowel: There is no bowel obstruction or active inflammation. The appendix is normal. Vascular/Lymphatic: The abdominal aorta and IVC unremarkable. No portal venous gas. There is no adenopathy. Reproductive: The uterus is anteverted and grossly unremarkable. Bilateral tubal ligation clips noted. No adnexal masses. Other: Right rectus sheath hematoma measuring approximately 3.4 x 2.6 cm in greatest axial dimensions and 3.7 cm in craniocaudal length. Musculoskeletal: Small fat containing umbilical hernia. No acute osseous pathology. IMPRESSION:  1. Right rectus sheath hematoma. 2. No bowel obstruction. Normal appendix. 3. Fatty liver. Electronically Signed   By: Anner Crete M.D.   On: 01/01/2021 22:33   US PELVIC COMPLETE W TRANSVAGINAL AND TORSION R/O  Result Date: 01/01/2021 CLINICAL DATA:  Initial evaluation for acute right upper quadrant pain, vaginal bleeding. EXAM: TRANSABDOMINAL AND TRANSVAGINAL ULTRASOUND OF PELVIS DOPPLER ULTRASOUND OF OVARIES TECHNIQUE: Both transabdominal and transvaginal ultrasound examinations of the pelvis were performed. Transabdominal technique was performed for global imaging of the pelvis including uterus, ovaries, adnexal regions, and pelvic cul-de-sac. It was necessary to proceed with endovaginal exam following the transabdominal exam to visualize the uterus, endometrium, and ovaries. Color and duplex Doppler ultrasound was utilized to evaluate blood flow to the ovaries. COMPARISON:  None. FINDINGS: Uterus Measurements: 7.5 x 3.6 x 4.5 cm = volume: 65 mL. Uterus is anteverted. 0.8 x 0.5 x 0.6 cm intramural fibroid present at the anterior uterine body. 0.8 x 0.7 x 0.8 cm intramural fibroid present at the  anterior uterine fundus. Additional 1.2 x 1.2 x 1.1 cm intramural fibroid present at the right posterior uterine fundus. Endometrium Thickness: 6.7 mm. Endometrial stripe somewhat ill-defined without focal abnormality. Scattered areas of internal vascularity seen within the endometrial complex (image 40). Right ovary Measurements: 2.6 x 1.3 x 1.6 cm = volume: 2.7 mL. Normal appearance/no adnexal mass. Left ovary Measurements: 2.0 x 1.3 x 1.4 cm = volume: 1.9 mL. Normal appearance/no adnexal mass. Pulsed Doppler evaluation of both ovaries demonstrates normal low-resistance arterial and venous waveforms. Other findings No abnormal free fluid. IMPRESSION: 1. Ill-defined endometrial stripe measuring up to 6.7 mm with associated vascularity. Findings are nonspecific, but could reflect changes of endometrial hyperplasia. Endometrial carcinoma is not excluded. Further evaluation with dedicated sonohysterography or hysteroscopy with possible endometrial sampling recommended. 2. Underlying fibroid uterus as above. 3. Otherwise negative and normal pelvic ultrasound. No evidence for ovarian torsion or other acute abnormality. Electronically Signed   By: Jeannine Boga M.D.   On: 01/01/2021 21:51     Assessment & Plan:   Severe sleep apnea - Split-night sleep study in September 2019 showed severe obstructive sleep apnea, AHI was 139 with SPO2 O2 low in the 70s.  Events were eliminated at CPAP pressure of 9 cm h20. She was never started on CPAP due to cost.  - Patient continues to have symptoms of loud snoring, restless sleep and daytime sleepiness. Placing order for auto CPAP 5-15cm h20. Advised patient to aim to wear every night for 4-6 hours or longer. FU in 4-6 months.    Martyn Ehrich, NP 01/18/2021

## 2021-01-19 MED ORDER — SAXENDA 18 MG/3ML ~~LOC~~ SOPN: PEN_INJECTOR | SUBCUTANEOUS | 0 refills | Status: DC

## 2021-01-19 MED ORDER — PEN NEEDLES 30G X 5 MM MISC: 1.0000 | Freq: Every day | 0 refills | Status: DC

## 2021-01-19 NOTE — Addendum Note (Signed)
Addended by: Jearld Fenton on: 01/19/2021 12:30 AM   Modules accepted: Orders

## 2021-01-27 ENCOUNTER — Encounter: Payer: Federal, State, Local not specified - PPO | Admitting: Internal Medicine

## 2021-01-30 DIAGNOSIS — N393 Stress incontinence (female) (male): Secondary | ICD-10-CM | POA: Diagnosis not present

## 2021-01-30 DIAGNOSIS — G4733 Obstructive sleep apnea (adult) (pediatric): Secondary | ICD-10-CM | POA: Diagnosis not present

## 2021-01-31 DIAGNOSIS — G4733 Obstructive sleep apnea (adult) (pediatric): Secondary | ICD-10-CM | POA: Diagnosis not present

## 2021-02-03 ENCOUNTER — Other Ambulatory Visit: Payer: Self-pay | Admitting: Internal Medicine

## 2021-02-03 DIAGNOSIS — K219 Gastro-esophageal reflux disease without esophagitis: Secondary | ICD-10-CM

## 2021-02-03 DIAGNOSIS — F32A Depression, unspecified: Secondary | ICD-10-CM

## 2021-02-03 DIAGNOSIS — F419 Anxiety disorder, unspecified: Secondary | ICD-10-CM

## 2021-02-17 ENCOUNTER — Telehealth: Payer: Self-pay

## 2021-02-17 ENCOUNTER — Encounter: Payer: Self-pay | Admitting: Internal Medicine

## 2021-02-17 NOTE — Telephone Encounter (Signed)
noted 

## 2021-02-17 NOTE — Telephone Encounter (Signed)
I spoke with pt who has had SOB since Covid + in Jan but has worsened. Pt is SOB sitting and resting or with exertion; pt has lightheadedness on and off but no CP. Pt said she is presently at [facility] in [place]. Sending note to Avie Echevaria NP.

## 2021-02-17 NOTE — Telephone Encounter (Signed)
[facility] - Client TELEPHONE ADVICE RECORD [facility] Patient Name: Anne Mcguire Gender: Female DOB: Feb 07, 1971 Age: 50 Y 22 D Return Phone Number: [phone] (Primary) Address: City/State/Zip: [place] [address] Client [facility] - Client Client Site [facility] - Day Physician Webb Silversmith - NP Contact Type Call Who Is Calling Patient / Member / Family / Caregiver Call Type Triage / Clinical Relationship To Patient Self Return Phone Number [phone] (Primary) Chief Complaint BREATHING - shortness of breath or sounds breathless Reason for Call Symptomatic / Request for [facility] states she has swelling in her feet and shortness of breath. Caller states the office is completely booked. Translation No Nurse Assessment Nurse: Lenon Curt, RN, Melanie Date/Time (Eastern Time): 02/17/2021 12:31:58 PM Confirm and document reason for call. If symptomatic, describe symptoms. ---Caller states she has swelling in her ankles and feet and shortness of breath. Since COVID at end of January. For 2 weeks. SOB - winded easily when walking and carrying something. Omeprazole long term use and now has to take a Tums too. Does the patient have any new or worsening symptoms? ---Yes Will a triage be completed? ---Yes Related visit to physician within the last 2 weeks? ---No Does the PT have any chronic conditions? (i.e. diabetes, asthma, this includes High risk factors for pregnancy, etc.) ---Yes List chronic conditions. ---GERD, HTN, anxiety, thyroid, cholesterol. New = Vit B12 and Vit D - labs were low - F/U in 6 months Is the patient pregnant or possibly pregnant? (Ask all females between the ages of 2-55) ---No Is this a behavioral health or substance abuse call? ---No Guidelines Guideline Title Affirmed Question Affirmed Notes Nurse Date/Time (Eastern Time) Breathing  Difficulty [1] MILD difficulty breathing (e.g., minimal/ no SOB at rest, SOB with walking, pulse <100) AND [2] NEW-onset or WORSE than normal Lenon Curt, RN, Threasa Beards 02/17/2021 12:37:12 PM PLEASE NOTE: All timestamps contained within this report are represented as [place] Standard Time. CONFIDENTIALTY NOTICE: This fax transmission is intended only for the addressee. It contains information that is legally privileged, confidential or otherwise protected from use or disclosure. If you are not the intended recipient, you are strictly prohibited from reviewing, disclosing, copying using or disseminating any of this information or taking any action in reliance on or regarding this information. If you have received this fax in error, please notify us immediately by telephone so that we can arrange for its return to [place]. Phone: [phone], Toll-Free: [phone], Fax: [phone] Page: 2 of 2 Call Id: [id number] Guidelines Guideline Title Affirmed Question Affirmed Notes Nurse Date/Time Eilene Ghazi Time) Leg Swelling and Edema [1] Difficulty breathing with exertion (e.g., walking) AND [2] newonset or worsening Allean Found 02/17/2021 12:42:11 PM Disp. Time Eilene Ghazi Time) Disposition Final User 02/17/2021 12:29:48 PM Send to Urgent Queue Peggye Fothergill 02/17/2021 12:40:34 PM See HCP within 4 Hours (or PCP triage) Lenon Curt, RN, Melanie 02/17/2021 12:44:24 PM Go to ED Now (or PCP triage) Yes Lenon Curt, RN, Melanie Caller Disagree/Comply Comply Caller Understands Yes PreDisposition Call Doctor Care Advice Given Per Guideline SEE HCP (OR PCP TRIAGE) WITHIN 4 HOURS: * IF OFFICE WILL BE OPEN: You need to be seen within the next 3 or 4 hours. Call your doctor (or NP/PA) now or as soon as the office opens. * OFFICE: If patient sounds stable and not seriously ill, consult PCP (or follow your office policy) to see if patient can be seen NOW in office. * UCC:  Some UCCs can manage patients who are stable and have  less serious symptoms (e.g., minor illnesses and injuries). The triager must know the [facility] capabilities before sending a patient there. If unsure, call ahead. CALL BACK IF: * You become worse CARE ADVICE given per Breathing Difficulty (Adult) guideline. GO TO ED NOW (OR PCP TRIAGE): * IF PCP SECOND-LEVEL TRIAGE REQUIRED: You may need to be seen. Your doctor (or NP/PA) will want to talk with you to decide what's best. I'll page the provider on-call now. If you haven't heard from the provider (or me) within 30 minutes, go directly to the [facility] at _____________ [place]: * It is better and safer if another adult drives instead of you. CARE ADVICE given per Leg Swelling and Edema (Adult) guideline. Comments User: Erik Obey, RN Date/Time Eilene Ghazi Time): 02/17/2021 12:38:09 PM The SOB comes and goes even with activity User: Erik Obey, RN Date/Time (Eastern Time): 02/17/2021 12:38:57 PM CPAP machine started 3 weeks ago. Referrals GO TO FACILITY UNDECIDED REFERRED TO PCP OFFICE

## 2021-02-17 NOTE — Telephone Encounter (Signed)
Fabio Asa said pt called and was advised by [facility] to call and schedule appt with PCP or card and if need be seen prior to FU appt with PCP to go to ED. Fabio Asa scheduled appt with Avie Echevaria NP on 02/21/21. Sending note to Avie Echevaria NP and [facility] CMA.

## 2021-02-19 NOTE — Telephone Encounter (Cosign Needed)
Should not be scheduled in the 4:15 for a ER followup. Appt needs to be changed.

## 2021-02-20 NOTE — Telephone Encounter (Signed)
It looks like this has already been rescheduled to 3/17.

## 2021-02-21 ENCOUNTER — Ambulatory Visit: Payer: Federal, State, Local not specified - PPO | Admitting: Internal Medicine

## 2021-02-23 ENCOUNTER — Ambulatory Visit: Payer: Federal, State, Local not specified - PPO | Admitting: Internal Medicine

## 2021-02-23 ENCOUNTER — Other Ambulatory Visit: Payer: Self-pay

## 2021-02-23 ENCOUNTER — Encounter: Payer: Self-pay | Admitting: Internal Medicine

## 2021-02-23 VITALS — BP 128/90 | HR 88 | Temp 98.1°F | Wt 258.0 lb

## 2021-02-23 DIAGNOSIS — E039 Hypothyroidism, unspecified: Secondary | ICD-10-CM

## 2021-02-23 DIAGNOSIS — R0600 Dyspnea, unspecified: Secondary | ICD-10-CM | POA: Diagnosis not present

## 2021-02-23 DIAGNOSIS — I10 Essential (primary) hypertension: Secondary | ICD-10-CM

## 2021-02-23 DIAGNOSIS — Z6841 Body Mass Index (BMI) 40.0 and over, adult: Secondary | ICD-10-CM

## 2021-02-23 DIAGNOSIS — R609 Edema, unspecified: Secondary | ICD-10-CM

## 2021-02-23 DIAGNOSIS — R0609 Other forms of dyspnea: Secondary | ICD-10-CM

## 2021-02-23 LAB — BASIC METABOLIC PANEL
BUN: 15 mg/dL (ref 6–23)
CO2: 29 mEq/L (ref 19–32)
Calcium: 9.1 mg/dL (ref 8.4–10.5)
Chloride: 102 mEq/L (ref 96–112)
Creatinine, Ser: 0.69 mg/dL (ref 0.40–1.20)
GFR: 102.15 mL/min (ref >60.00)
Glucose, Bld: 98 mg/dL (ref 70–99)
Potassium: 4.5 mEq/L (ref 3.5–5.1)
Sodium: 139 mEq/L (ref 135–145)

## 2021-02-23 NOTE — Progress Notes (Unsigned)
Subjective:    Patient ID: Anne Mcguire, female    DOB: 28-Feb-1971, 50 y.o.   MRN: [id number]  HPI  Patient presents to the clinic today for urgent care follow-up.  She presented to the ER 3/11 with complaint of swelling.  She reports she has had some associated chest tightness and shortness of breath prior but that was not a major issue at the ER.  She reports the swelling feels tight, making her legs feel like jelly.  ECG was obtained, unchanged from prior.  There were no labs drawn.  She was discharged 3/11 and advised to follow-up with her PCP.  She reports she had accidentally missed her levothyroxine for 2 weeks prior to the leg swelling.  She got restarted on this a few days ago and noticed that the swelling initially seem to be better but has since persisted.  She denies numbness, tingling or weakness of her lower extremities.  She denies cough but has had intermittent shortness of breath, worse with exertion.  She denies chest pain at this time.  She is not on any medications that should cause significant swelling.  She has not tried anything OTC for this.  Review of Systems      Past Medical History:  Diagnosis Date  . Anxiety   . Borderline hypertension    situational--  anxiety  . Depression   . GERD (gastroesophageal reflux disease)   . History of peptic ulcer    several years ago  . Hyperlipidemia   . Hypertension   . Hypothyroidism   . Menorrhagia   . Migraines   . Nocturia   . Wears glasses     Current Outpatient Medications  Medication Sig Dispense Refill  . atorvastatin (LIPITOR) 10 MG tablet TAKE 1 TABLET BY MOUTH ONCE A DAY *SCHEDULE PHYSICAL EXAM* 30 tablet 0  . buPROPion (WELLBUTRIN SR) 150 MG 12 hr tablet TAKE 1 TABLET BY MOUTH DAILY 30 tablet 0  . FLUoxetine (PROZAC) 40 MG capsule TAKE 1 CAPSULE BY MOUTH DAILY 30 capsule 0  . hydrOXYzine (ATARAX/VISTARIL) 10 MG tablet TAKE ONE TABLET BY MOUTH DAILY AS NEEDED 30 tablet 0  . levothyroxine (SYNTHROID)  100 MCG tablet TAKE 1 TAB BY MOUTH ONCE DAILY. TAKE ON AN EMPTY STOMACH WITH A GLASS OF WATER ATLEAST 30-60 MINUTES BEFORE BREAKFAST 30 tablet 0  . lisinopril (ZESTRIL) 20 MG tablet TAKE 1 TABLET BY MOUTH ONCE DAILY 30 tablet 0  . meloxicam (MOBIC) 15 MG tablet Take 1 tablet (15 mg total) by mouth daily. 30 tablet 1  . omeprazole (PRILOSEC) 20 MG capsule TAKE 1 CAPSULE BY MOUTH ONCE DAILY 30 capsule 0  . Vitamin D, Ergocalciferol, (DRISDOL) 1.25 MG (50000 UNIT) CAPS capsule Take 1 capsule (50,000 Units total) by mouth every 7 (seven) days. 12 capsule 0   No current facility-administered medications for this visit.    Allergies  Allergen Reactions  . Azithromycin Hives  . Eggs Or Egg-Derived Products Itching and Rash    THROAT ITCH    Family History  Problem Relation Age of Onset  . Mental illness Mother   . Arthritis Father   . Hyperlipidemia Father   . Hypertension Father   . Diabetes Father   . Arthritis Maternal Grandmother   . Diabetes Maternal Grandmother   . Arthritis Maternal Grandfather   . Diabetes Maternal Grandfather   . Arthritis Paternal Grandmother   . Breast cancer Paternal Grandmother     Social History   Socioeconomic History  .  Marital status: Married    Spouse name: Not on file  . Number of children: Not on file  . Years of education: Not on file  . Highest education level: Not on file  Occupational History  . Not on file  Tobacco Use  . Smoking status: Never Smoker  . Smokeless tobacco: Never Used  Vaping Use  . Vaping Use: Never used  Substance and Sexual Activity  . Alcohol use: Yes    Comment: occasional  . Drug use: No  . Sexual activity: Yes    Birth control/protection: I.U.D.  Other Topics Concern  . Not on file  Social History Narrative  . Not on file   Social Determinants of Health   Financial Resource Strain: Not on file  Food Insecurity: Not on file  Transportation Needs: Not on file  Physical Activity: Not on file  Stress:  Not on file  Social Connections: Not on file  Intimate Partner Violence: Not on file     Constitutional: Pt reports weight gain. Denies fever, malaise, fatigue, headache.  HEENT: Denies eye pain, eye redness, ear pain, ringing in the ears, wax buildup, runny nose, nasal congestion, bloody nose, or sore throat. Respiratory: Pt reports shortness of breath exertion. Denies difficulty breathing, cough or sputum production.   Cardiovascular: Pt reports swelling in feet. Denies chest pain, chest tightness, palpitations or swelling in the hands.  Musculoskeletal: Denies decrease in range of motion, difficulty with gait, muscle pain or joint pain and swelling.  Skin: Denies redness, rashes, lesions or ulcercations.  Neurological: Denies dizziness, difficulty with memory, difficulty with speech or problems with balance and coordination.    No other specific complaints in a complete review of systems (except as listed in HPI above).  BP 128/90   Pulse 88   Temp 98.1 F (36.7 C) (Temporal)   Wt 258 lb (117 kg)   SpO2 98%   BMI 44.29 kg/m   Wt Readings from Last 3 Encounters:  01/18/21 247 lb 9.6 oz (112.3 kg)  01/01/21 245 lb (111.1 kg)  01/07/20 234 lb (106.1 kg)    General: Appears her stated age, obese, in NAD. Skin: Warm, dry and intact. No rashes, lesions or ulcerations noted. HEENT: Head: normal shape and size; Eyes: sclera white, no icterus, conjunctiva pink, PERRLA and EOMs intact;  Neck:  No JVD noted. Cardiovascular: Normal rate and rhythm. S1,S2 noted.  No murmur, rubs or gallops noted. 1 + nonpitting BLE edema.  Pulmonary/Chest: Normal effort and positive vesicular breath sounds. No respiratory distress. No wheezes, rales or ronchi noted.  Musculoskeletal:  No difficulty with gait.  Neurological: Alert and oriented.   BMET    Component Value Date/Time   NA 141 01/01/2021 1645   K 3.5 01/01/2021 1645   CL 103 01/01/2021 1645   CO2 25 01/01/2021 1645   GLUCOSE 94  01/01/2021 1645   BUN 15 01/01/2021 1645   CREATININE 0.72 01/01/2021 1645   CALCIUM 9.0 01/01/2021 1645   GFRNONAA >60 01/01/2021 1645    Lipid Panel     Component Value Date/Time   CHOL 155 03/25/2020 0749   TRIG 107.0 03/25/2020 0749   HDL 50.60 03/25/2020 0749   CHOLHDL 3 03/25/2020 0749   VLDL 21.4 03/25/2020 0749   LDLCALC 83 03/25/2020 0749    CBC    Component Value Date/Time   WBC 7.0 01/01/2021 1645   RBC 4.42 01/01/2021 1645   HGB 12.8 01/01/2021 1645   HCT 40.5 01/01/2021 1645  PLT 176 01/01/2021 1645   MCV 91.6 01/01/2021 1645   MCH 29.0 01/01/2021 1645   MCHC 31.6 01/01/2021 1645   RDW 13.8 01/01/2021 1645    Hgb A1C Lab Results  Component Value Date   HGBA1C 6.1 (A) 12/19/2020           Assessment & Plan:   UC Follow up for Peripheral Edema, Obesity, Dyspnea on Exertion:  UC notes and procedures reviewed Wonder if this swelling and DOE is related to her weight, encouraged her to consume a low carb, low fat diet and exercise for weight loss Reinforced DASH diet and exercise for weight loss BMET today Will likely add diuretic to her Lisinopril pending labs  Hypothyroidism:   TSH today Will likely not adjust Levothyroxine as she just got restarted back on this after missing it for 2 weeks Will monitor  Will follow up after labs, RTC in 2 weeks for recheck edema, HTN, BMET Webb Silversmith, NP This visit occurred during the SARS-CoV-2 public health emergency.  Safety protocols were in place, including screening questions prior to the visit, additional usage of staff PPE, and extensive cleaning of exam room while observing appropriate contact time as indicated for disinfecting solutions.

## 2021-02-24 ENCOUNTER — Encounter: Payer: Self-pay | Admitting: Internal Medicine

## 2021-02-24 LAB — TSH: TSH: 1.08 u[IU]/mL (ref 0.35–4.50)

## 2021-02-24 MED ORDER — LISINOPRIL-HYDROCHLOROTHIAZIDE 20-12.5 MG PO TABS: 1.0000 | ORAL_TABLET | Freq: Every day | ORAL | 0 refills | Status: DC

## 2021-02-24 NOTE — Patient Instructions (Signed)
Peripheral Edema  Peripheral edema is swelling that is caused by a buildup of fluid. Peripheral edema most often affects the lower legs, ankles, and feet. It can also develop in the arms, hands, and face. The area of the body that has peripheral edema will look swollen. It may also feel heavy or warm. Your clothes may start to feel tight. Pressing on the area may make a temporary dent in your skin. You may not be able to move your swollen arm or leg as much as usual. There are many causes of peripheral edema. It can happen because of a complication of other conditions such as congestive heart failure, kidney disease, or a problem with your blood circulation. It also can be a side effect of certain medicines or because of an infection. It often happens to women during pregnancy. Sometimes, the cause is not known. Follow these instructions at home: Managing pain, stiffness, and swelling  Raise (elevate) your legs while you are sitting or lying down.  Move around often to prevent stiffness and to lessen swelling.  Do not sit or stand for long periods of time.  Wear support stockings as told by your health care provider.   Medicines  Take over-the-counter and prescription medicines only as told by your health care provider.  Your health care provider may prescribe medicine to help your body get rid of excess water (diuretic). General instructions  Pay attention to any changes in your symptoms.  Follow instructions from your health care provider about limiting salt (sodium) in your diet. Sometimes, eating less salt may reduce swelling.  Moisturize skin daily to help prevent skin from cracking and draining.  Keep all follow-up visits as told by your health care provider. This is important. Contact a health care provider if you have:  A fever.  Edema that starts suddenly or is getting worse, especially if you are pregnant or have a medical condition.  Swelling in only one leg.  Increased  swelling, redness, or pain in one or both of your legs.  Drainage or sores at the area where you have edema. Get help right away if you:  Develop shortness of breath, especially when you are lying down.  Have pain in your chest or abdomen.  Feel weak.  Feel faint. Summary  Peripheral edema is swelling that is caused by a buildup of fluid. Peripheral edema most often affects the lower legs, ankles, and feet.  Move around often to prevent stiffness and to lessen swelling. Do not sit or stand for long periods of time.  Pay attention to any changes in your symptoms.  Contact a health care provider if you have edema that starts suddenly or is getting worse, especially if you are pregnant or have a medical condition.  Get help right away if you develop shortness of breath, especially when lying down. This information is not intended to replace advice given to you by your health care provider. Make sure you discuss any questions you have with your health care provider. Document Revised: 08/20/2018 Document Reviewed: 08/20/2018 Elsevier Patient Education  2021 [employer].

## 2021-02-27 DIAGNOSIS — G4733 Obstructive sleep apnea (adult) (pediatric): Secondary | ICD-10-CM | POA: Diagnosis not present

## 2021-03-09 ENCOUNTER — Other Ambulatory Visit: Payer: Self-pay

## 2021-03-09 ENCOUNTER — Ambulatory Visit: Payer: Federal, State, Local not specified - PPO | Admitting: Internal Medicine

## 2021-03-09 ENCOUNTER — Encounter: Payer: Self-pay | Admitting: Internal Medicine

## 2021-03-09 VITALS — BP 124/78 | HR 82 | Temp 98.2°F | Wt 252.0 lb

## 2021-03-09 DIAGNOSIS — R609 Edema, unspecified: Secondary | ICD-10-CM

## 2021-03-09 DIAGNOSIS — Z6841 Body Mass Index (BMI) 40.0 and over, adult: Secondary | ICD-10-CM | POA: Diagnosis not present

## 2021-03-09 DIAGNOSIS — I10 Essential (primary) hypertension: Secondary | ICD-10-CM

## 2021-03-09 LAB — BASIC METABOLIC PANEL
BUN: 22 mg/dL (ref 6–23)
CO2: 30 mEq/L (ref 19–32)
Calcium: 9.3 mg/dL (ref 8.4–10.5)
Chloride: 103 mEq/L (ref 96–112)
Creatinine, Ser: 0.8 mg/dL (ref 0.40–1.20)
GFR: 86.7 mL/min (ref >60.00)
Glucose, Bld: 110 mg/dL — ABNORMAL HIGH (ref 70–99)
Potassium: 4.4 mEq/L (ref 3.5–5.1)
Sodium: 139 mEq/L (ref 135–145)

## 2021-03-09 NOTE — Patient Instructions (Signed)

## 2021-03-09 NOTE — Progress Notes (Signed)
Subjective:    Patient ID: Anne Mcguire, female    DOB: 09-09-1971, 50 y.o.   MRN: [id number]  HPI  Pt presents to the clinic today for follow up HTN and edema. At her last visit, HCTZ was added to her Lisinopril for increased swelling in her legs. She has been taking the medication as prescribed. She has not noticed any improvement in the swelling in her legs but denies muscle cramps. She has lost 6 lbs in the last 2 weeks. Her BP today is 124/78.   Review of Systems      Past Medical History:  Diagnosis Date  . Anxiety   . Borderline hypertension    situational--  anxiety  . Depression   . GERD (gastroesophageal reflux disease)   . History of peptic ulcer    several years ago  . Hyperlipidemia   . Hypertension   . Hypothyroidism   . Menorrhagia   . Migraines   . Nocturia   . Wears glasses     Current Outpatient Medications  Medication Sig Dispense Refill  . atorvastatin (LIPITOR) 10 MG tablet TAKE 1 TABLET BY MOUTH ONCE A DAY *SCHEDULE PHYSICAL EXAM* 30 tablet 0  . buPROPion (WELLBUTRIN SR) 150 MG 12 hr tablet TAKE 1 TABLET BY MOUTH DAILY 30 tablet 0  . FLUoxetine (PROZAC) 40 MG capsule TAKE 1 CAPSULE BY MOUTH DAILY (Patient not taking: Reported on 02/23/2021) 30 capsule 0  . hydrOXYzine (ATARAX/VISTARIL) 10 MG tablet TAKE ONE TABLET BY MOUTH DAILY AS NEEDED 30 tablet 0  . levothyroxine (SYNTHROID) 100 MCG tablet TAKE 1 TAB BY MOUTH ONCE DAILY. TAKE ON AN EMPTY STOMACH WITH A GLASS OF WATER ATLEAST 30-60 MINUTES BEFORE BREAKFAST 30 tablet 0  . lisinopril-hydrochlorothiazide (ZESTORETIC) 20-12.5 MG tablet Take 1 tablet by mouth daily. 30 tablet 0  . meloxicam (MOBIC) 15 MG tablet Take 1 tablet (15 mg total) by mouth daily. 30 tablet 1  . omeprazole (PRILOSEC) 20 MG capsule TAKE 1 CAPSULE BY MOUTH ONCE DAILY 30 capsule 0  . Vitamin D, Ergocalciferol, (DRISDOL) 1.25 MG (50000 UNIT) CAPS capsule Take 1 capsule (50,000 Units total) by mouth every 7 (seven) days. 12 capsule  0   No current facility-administered medications for this visit.    Allergies  Allergen Reactions  . Azithromycin Hives  . Eggs Or Egg-Derived Products Itching and Rash    THROAT ITCH    Family History  Problem Relation Age of Onset  . Mental illness Mother   . Arthritis Father   . Hyperlipidemia Father   . Hypertension Father   . Diabetes Father   . Arthritis Maternal Grandmother   . Diabetes Maternal Grandmother   . Arthritis Maternal Grandfather   . Diabetes Maternal Grandfather   . Arthritis Paternal Grandmother   . Breast cancer Paternal Grandmother     Social History   Socioeconomic History  . Marital status: Married    Spouse name: Not on file  . Number of children: Not on file  . Years of education: Not on file  . Highest education level: Not on file  Occupational History  . Not on file  Tobacco Use  . Smoking status: Never Smoker  . Smokeless tobacco: Never Used  Vaping Use  . Vaping Use: Never used  Substance and Sexual Activity  . Alcohol use: Yes    Comment: occasional  . Drug use: No  . Sexual activity: Yes    Birth control/protection: I.U.D.  Other Topics Concern  .  Not on file  Social History Narrative  . Not on file   Social Determinants of Health   Financial Resource Strain: Not on file  Food Insecurity: Not on file  Transportation Needs: Not on file  Physical Activity: Not on file  Stress: Not on file  Social Connections: Not on file  Intimate Partner Violence: Not on file     Constitutional: Denies fever, malaise, fatigue, headache or abrupt weight changes.  Respiratory: Denies difficulty breathing, shortness of breath, cough or sputum production.   Cardiovascular: Pt reports swelling in legs. Denies chest pain, chest tightness, palpitations or swelling in the hands Neurological: Denies dizziness, difficulty with memory, difficulty with speech or problems with balance and coordination.    No other specific complaints in a  complete review of systems (except as listed in HPI above).  Objective:   Physical Exam  BP 124/78   Pulse 82   Temp 98.2 F (36.8 C) (Temporal)   Wt 252 lb (114.3 kg)   SpO2 98%   BMI 43.26 kg/m   Wt Readings from Last 3 Encounters:  02/23/21 258 lb (117 kg)  01/18/21 247 lb 9.6 oz (112.3 kg)  01/01/21 245 lb (111.1 kg)    General: Appears her stated age, obese, in NAD. Skin: Warm, dry and intact. No redness or warmth noted. Cardiovascular: Normal rate and rhythm. S1,S2 noted.  No murmur, rubs or gallops noted. 1+ pitting BLE edema.  Pulmonary/Chest: Normal effort and positive vesicular breath sounds. No respiratory distress. No wheezes, rales or ronchi noted.  Neurological: Alert and oriented.   BMET    Component Value Date/Time   NA 139 02/23/2021 0955   K 4.5 02/23/2021 0955   CL 102 02/23/2021 0955   CO2 29 02/23/2021 0955   GLUCOSE 98 02/23/2021 0955   BUN 15 02/23/2021 0955   CREATININE 0.69 02/23/2021 0955   CALCIUM 9.1 02/23/2021 0955   GFRNONAA >60 01/01/2021 1645    Lipid Panel     Component Value Date/Time   CHOL 155 03/25/2020 0749   TRIG 107.0 03/25/2020 0749   HDL 50.60 03/25/2020 0749   CHOLHDL 3 03/25/2020 0749   VLDL 21.4 03/25/2020 0749   LDLCALC 83 03/25/2020 0749    CBC    Component Value Date/Time   WBC 7.0 01/01/2021 1645   RBC 4.42 01/01/2021 1645   HGB 12.8 01/01/2021 1645   HCT 40.5 01/01/2021 1645   PLT 176 01/01/2021 1645   MCV 91.6 01/01/2021 1645   MCH 29.0 01/01/2021 1645   MCHC 31.6 01/01/2021 1645   RDW 13.8 01/01/2021 1645    Hgb A1C Lab Results  Component Value Date   HGBA1C 6.1 (A) 12/19/2020            Assessment & Plan:   Peripheral Edema:  Improved but persistent BMET today If kidney function/potassium normal, will add Furosemide 20 mg daily x 5 days She is considering wearing compression stockings Encouraged elevation, low salt diet and exercise for weight loss  Will follow up after labs,  return precautions discussed Webb Silversmith, NP This visit occurred during the SARS-CoV-2 public health emergency.  Safety protocols were in place, including screening questions prior to the visit, additional usage of staff PPE, and extensive cleaning of exam room while observing appropriate contact time as indicated for disinfecting solutions.

## 2021-03-09 NOTE — Assessment & Plan Note (Signed)
Improved on Lisinopril HCT BMET today Reinforced DASH diet and exercise for weight loss

## 2021-03-10 ENCOUNTER — Other Ambulatory Visit: Payer: Self-pay | Admitting: Internal Medicine

## 2021-03-10 DIAGNOSIS — F419 Anxiety disorder, unspecified: Secondary | ICD-10-CM

## 2021-03-10 DIAGNOSIS — F32A Depression, unspecified: Secondary | ICD-10-CM

## 2021-03-11 ENCOUNTER — Other Ambulatory Visit: Payer: Self-pay | Admitting: Internal Medicine

## 2021-03-11 MED ORDER — FUROSEMIDE 20 MG PO TABS
20.0000 mg | ORAL_TABLET | Freq: Every day | ORAL | 0 refills | Status: DC
Start: 2021-03-11 — End: 2021-06-01

## 2021-03-15 ENCOUNTER — Other Ambulatory Visit: Payer: Self-pay | Admitting: Internal Medicine

## 2021-03-15 DIAGNOSIS — K219 Gastro-esophageal reflux disease without esophagitis: Secondary | ICD-10-CM

## 2021-03-15 MED ORDER — OMEPRAZOLE 20 MG PO CPDR: 1.0000 | DELAYED_RELEASE_CAPSULE | Freq: Every day | ORAL | 0 refills | Status: DC

## 2021-03-15 MED ORDER — LISINOPRIL-HYDROCHLOROTHIAZIDE 20-12.5 MG PO TABS: 1.0000 | ORAL_TABLET | Freq: Every day | ORAL | 0 refills | Status: DC

## 2021-03-20 ENCOUNTER — Telehealth: Payer: Self-pay | Admitting: Primary Care

## 2021-03-20 DIAGNOSIS — G4730 Sleep apnea, unspecified: Secondary | ICD-10-CM

## 2021-03-20 NOTE — Telephone Encounter (Signed)
Called and spoke to patient.  Patient stated that she is not tolerating cpap.  She has tried two different mask without success. She is removing the mask during sleep. She has tried f30 and a mask that covers mask and nose.  Patient is wearing cpap avg 2hr nightly. She is receiving calls for compliance department.  She is questioning if oral appliance would be a option for her.   Beth, please advise. Thanks

## 2021-03-20 NOTE — Telephone Encounter (Signed)
Referral has been placed for desensitization mask fit. Download has been printed and placed to Beth's look at folder.  Patient is aware and voiced her understanding.  Nothing further needed at this time.

## 2021-03-20 NOTE — Telephone Encounter (Signed)
Needs mask desensitization study. She has severe OSA, I do not want to stop wearing CPAP. Also pull a download for me and ill look at it tomorr, maybe we can adjust her pressure setting

## 2021-03-30 DIAGNOSIS — G4733 Obstructive sleep apnea (adult) (pediatric): Secondary | ICD-10-CM | POA: Diagnosis not present

## 2021-04-03 ENCOUNTER — Encounter: Payer: Self-pay | Admitting: Internal Medicine

## 2021-04-03 DIAGNOSIS — F419 Anxiety disorder, unspecified: Secondary | ICD-10-CM

## 2021-04-03 DIAGNOSIS — F32A Depression, unspecified: Secondary | ICD-10-CM

## 2021-04-03 MED ORDER — FLUOXETINE HCL 40 MG PO CAPS: 40.0000 mg | ORAL_CAPSULE | Freq: Every day | ORAL | 0 refills | Status: DC

## 2021-04-06 ENCOUNTER — Other Ambulatory Visit: Payer: Self-pay | Admitting: Internal Medicine

## 2021-04-06 DIAGNOSIS — K219 Gastro-esophageal reflux disease without esophagitis: Secondary | ICD-10-CM

## 2021-04-10 ENCOUNTER — Other Ambulatory Visit: Payer: Self-pay | Admitting: Internal Medicine

## 2021-04-10 DIAGNOSIS — K219 Gastro-esophageal reflux disease without esophagitis: Secondary | ICD-10-CM

## 2021-04-10 DIAGNOSIS — F32A Depression, unspecified: Secondary | ICD-10-CM

## 2021-04-11 ENCOUNTER — Ambulatory Visit (HOSPITAL_BASED_OUTPATIENT_CLINIC_OR_DEPARTMENT_OTHER): Payer: Federal, State, Local not specified - PPO | Attending: Primary Care | Admitting: Internal Medicine

## 2021-04-11 ENCOUNTER — Other Ambulatory Visit: Payer: Self-pay

## 2021-04-11 DIAGNOSIS — G4730 Sleep apnea, unspecified: Secondary | ICD-10-CM

## 2021-04-11 NOTE — Telephone Encounter (Signed)
Notes to clinic:  Patient was seen for office visit on 03/09/2021 Review medications for refill   Requested Prescriptions  Pending Prescriptions Disp Refills   levothyroxine (SYNTHROID) 100 MCG tablet [Pharmacy Med Name: LEVOTHYROXINE SODIUM 100 MCG TAB] 30 tablet 0    Sig: TAKE 1 TAB BY MOUTH ONCE DAILY. TAKE ON AN EMPTY STOMACH WITH A GLASS OF WATER ATLEAST 30-60 MINUTES BEFORE BREAKFAST      Endocrinology:  Hypothyroid Agents Failed - 04/06/2021 10:42 AM      Failed - TSH needs to be rechecked within 3 months after an abnormal result. Refill until TSH is due.      Failed - Valid encounter within last 12 months    Recent Outpatient Visits   None              Passed - TSH in normal range and within 360 days    TSH  Date Value Ref Range Status  02/23/2021 1.08 0.35 - 4.50 uIU/mL Final            atorvastatin (LIPITOR) 10 MG tablet [Pharmacy Med Name: ATORVASTATIN CALCIUM 10 MG TAB] 30 tablet 0    Sig: TAKE 1 TABLET BY MOUTH ONCE A DAY      Cardiovascular:  Antilipid - Statins Failed - 04/06/2021 10:42 AM      Failed - Total Cholesterol in normal range and within 360 days    Cholesterol  Date Value Ref Range Status  03/25/2020 155 0 - 200 mg/dL Final    Comment:    ATP III Classification       Desirable:  < 200 mg/dL               Borderline High:  200 - 239 mg/dL          High:  > = 240 mg/dL          Failed - LDL in normal range and within 360 days    LDL Cholesterol  Date Value Ref Range Status  03/25/2020 83 0 - 99 mg/dL Final          Failed - HDL in normal range and within 360 days    HDL  Date Value Ref Range Status  03/25/2020 50.60 >39.00 mg/dL Final          Failed - Triglycerides in normal range and within 360 days    Triglycerides  Date Value Ref Range Status  03/25/2020 107.0 0.0 - 149.0 mg/dL Final    Comment:    Normal:  <150 mg/dLBorderline High:  150 - 199 mg/dL          Failed - Valid encounter within last 12 months    Recent  Outpatient Visits   None              Passed - Patient is not pregnant        lisinopril-hydrochlorothiazide (ZESTORETIC) 20-12.5 MG tablet [Pharmacy Med Name: LISINOPRIL-HCTZ 20-12.5 MG TAB] 30 tablet     Sig: TAKE 1 TABLET BY MOUTH ONCE A DAY      Cardiovascular:  ACEI + Diuretic Combos Failed - 04/06/2021 10:42 AM      Failed - Valid encounter within last 6 months    Recent Outpatient Visits   None              Passed - Na in normal range and within 180 days    Sodium  Date Value Ref Range Status  03/09/2021 139 135 - 145  mEq/L Final          Passed - K in normal range and within 180 days    Potassium  Date Value Ref Range Status  03/09/2021 4.4 3.5 - 5.1 mEq/L Final          Passed - Cr in normal range and within 180 days    Creatinine, Ser  Date Value Ref Range Status  03/09/2021 0.80 0.40 - 1.20 mg/dL Final          Passed - Ca in normal range and within 180 days    Calcium  Date Value Ref Range Status  03/09/2021 9.3 8.4 - 10.5 mg/dL Final          Passed - Patient is not pregnant      Passed - Last BP in normal range    BP Readings from Last 1 Encounters:  03/09/21 124/78            omeprazole (PRILOSEC) 20 MG capsule [Pharmacy Med Name: OMEPRAZOLE DR 20 MG CAP] 30 capsule     Sig: TAKE 1 CAPSULE BY MOUTH ONCE DAILY      Gastroenterology: Proton Pump Inhibitors Failed - 04/06/2021 10:42 AM      Failed - Valid encounter within last 12 months    Recent Outpatient Visits   None

## 2021-04-12 MED ORDER — LEVOTHYROXINE SODIUM 100 MCG PO TABS: 100.0000 ug | ORAL_TABLET | Freq: Every day | ORAL | 0 refills | Status: DC

## 2021-04-12 MED ORDER — LISINOPRIL-HYDROCHLOROTHIAZIDE 20-12.5 MG PO TABS: 1.0000 | ORAL_TABLET | Freq: Every day | ORAL | 0 refills | Status: DC

## 2021-04-12 MED ORDER — BUPROPION HCL ER (SR) 150 MG PO TB12: 150.0000 mg | ORAL_TABLET | Freq: Every day | ORAL | 0 refills | Status: DC

## 2021-04-12 MED ORDER — OMEPRAZOLE 20 MG PO CPDR: 1.0000 | DELAYED_RELEASE_CAPSULE | Freq: Every day | ORAL | 0 refills | Status: DC

## 2021-04-12 MED ORDER — ATORVASTATIN CALCIUM 10 MG PO TABS: 1.0000 | ORAL_TABLET | Freq: Every day | ORAL | 0 refills | Status: DC

## 2021-04-29 DIAGNOSIS — G4733 Obstructive sleep apnea (adult) (pediatric): Secondary | ICD-10-CM | POA: Diagnosis not present

## 2021-05-29 DIAGNOSIS — N946 Dysmenorrhea, unspecified: Secondary | ICD-10-CM | POA: Diagnosis not present

## 2021-05-29 DIAGNOSIS — N393 Stress incontinence (female) (male): Secondary | ICD-10-CM | POA: Diagnosis not present

## 2021-05-30 DIAGNOSIS — G4733 Obstructive sleep apnea (adult) (pediatric): Secondary | ICD-10-CM | POA: Diagnosis not present

## 2021-06-01 ENCOUNTER — Encounter (HOSPITAL_BASED_OUTPATIENT_CLINIC_OR_DEPARTMENT_OTHER): Payer: Self-pay | Admitting: Obstetrics & Gynecology

## 2021-06-01 ENCOUNTER — Other Ambulatory Visit: Payer: Self-pay

## 2021-06-01 DIAGNOSIS — N920 Excessive and frequent menstruation with regular cycle: Secondary | ICD-10-CM

## 2021-06-01 DIAGNOSIS — R351 Nocturia: Secondary | ICD-10-CM

## 2021-06-01 DIAGNOSIS — F419 Anxiety disorder, unspecified: Secondary | ICD-10-CM

## 2021-06-01 DIAGNOSIS — E039 Hypothyroidism, unspecified: Secondary | ICD-10-CM

## 2021-06-01 DIAGNOSIS — K219 Gastro-esophageal reflux disease without esophagitis: Secondary | ICD-10-CM

## 2021-06-01 DIAGNOSIS — F32A Depression, unspecified: Secondary | ICD-10-CM

## 2021-06-01 DIAGNOSIS — I10 Essential (primary) hypertension: Secondary | ICD-10-CM

## 2021-06-01 DIAGNOSIS — Z973 Presence of spectacles and contact lenses: Secondary | ICD-10-CM

## 2021-06-01 DIAGNOSIS — G43909 Migraine, unspecified, not intractable, without status migrainosus: Secondary | ICD-10-CM

## 2021-06-01 DIAGNOSIS — G4730 Sleep apnea, unspecified: Secondary | ICD-10-CM

## 2021-06-01 DIAGNOSIS — E785 Hyperlipidemia, unspecified: Secondary | ICD-10-CM

## 2021-06-01 DIAGNOSIS — N393 Stress incontinence (female) (male): Secondary | ICD-10-CM

## 2021-06-01 DIAGNOSIS — M722 Plantar fascial fibromatosis: Secondary | ICD-10-CM

## 2021-06-01 HISTORY — DX: Anxiety disorder, unspecified: F41.9

## 2021-06-01 HISTORY — DX: Depression, unspecified: F32.A

## 2021-06-01 HISTORY — DX: Nocturia: R35.1

## 2021-06-01 HISTORY — DX: Plantar fascial fibromatosis: M72.2

## 2021-06-01 HISTORY — DX: Presence of spectacles and contact lenses: Z97.3

## 2021-06-01 HISTORY — DX: Hypothyroidism, unspecified: E03.9

## 2021-06-01 HISTORY — DX: Excessive and frequent menstruation with regular cycle: N92.0

## 2021-06-01 HISTORY — DX: Essential (primary) hypertension: I10

## 2021-06-01 HISTORY — DX: Sleep apnea, unspecified: G47.30

## 2021-06-01 HISTORY — DX: Stress incontinence (female) (male): N39.3

## 2021-06-01 HISTORY — DX: Hyperlipidemia, unspecified: E78.5

## 2021-06-01 HISTORY — DX: Gastro-esophageal reflux disease without esophagitis: K21.9

## 2021-06-01 HISTORY — DX: Migraine, unspecified, not intractable, without status migrainosus: G43.909

## 2021-06-01 NOTE — Progress Notes (Signed)
Spoke w/ via phone for pre-op interview---pt Lab needs dos---- urine preg              Lab results------has lab appt 06-05-2021 900 for cbc cmp T & S and ekg COVID test -----06-05-2021 1015 (overnight stay) Arrive at -------530 am 06-07-2021 NPO after MN NO Solid Food.  Clear liquids from MN until---430 am then npo Med rec completed Medications to take morning of surgery -----bupropion, atorvastatin, fluoxetine, omeprazole, hydroxyazine prn Diabetic medication -----n/a Patient instructed no nail polish to be worn day of surgery Patient instructed to bring photo id and insurance card day of surgery Patient aware to have Driver (ride ) / caregiver   husband nathan   for 24 hours after surgery  Patient Special Instructions -----bring cpap mask tubing and machine Pre-Op special Istructions -----pt given overnight stay instructions Patient verbalized understanding of instructions that were given at this phone interview. Patient denies shortness of breath, chest pain, fever, cough at this phone interview.   Sleep study 08-14-2018 epic

## 2021-06-01 NOTE — Progress Notes (Signed)
YOU ARE SCHEDULED FOR A COVID TEST ON  06-05-2021 at 94 AM.  THIS TEST MUST BE DONE BEFORE SURGERY. GO TO  [facility]. [place], [place], IT IS APPROXIMATELY 2 MINUTES PAST [employer] ON THE RIGHT AND REMAIN IN YOUR CAR, THIS IS A DRIVE UP TEST.       Your procedure is scheduled on 06-07-2021  Report to [facility]   Call this number if you have problems the morning of surgery  :[phone].   OUR ADDRESS IS [address].  WE ARE LOCATED IN [facility].  PLEASE BRING YOUR INSURANCE CARD AND PHOTO ID DAY OF SURGERY.  ONLY ONE PERSON ALLOWED IN FACILITY WAITING AREA.                                     REMEMBER:  DO NOT EAT FOOD, CANDY GUM OR MINTS  AFTER MIDNIGHT . YOU MAY HAVE CLEAR LIQUIDS FROM MIDNIGHT UNTIL 430 AM. NO CLEAR LIQUIDS AFTER 430 AM DAY OF SURGERY.   YOU MAY  BRUSH YOUR TEETH MORNING OF SURGERY AND RINSE YOUR MOUTH OUT, NO CHEWING GUM CANDY OR MINTS.    CLEAR LIQUID DIET   Foods Allowed                                                                     Foods Excluded  Coffee and tea, regular and decaf                             liquids that you cannot  Plain Jell-O any favor except red or purple                                           see through such as: Fruit ices (not with fruit pulp)                                     milk, soups, orange juice  Iced Popsicles                                    All solid food Carbonated beverages, regular and diet                                    Cranberry, grape and apple juices Sports drinks like Gatorade Lightly seasoned clear broth or consume(fat free) Sugar, honey syrup  Sample Menu Breakfast                                Lunch  Supper Cranberry juice                    Beef broth                            Chicken broth Jell-O                                     Grape juice                           Apple  juice Coffee or tea                        Jell-O                                      Popsicle                                                Coffee or tea                        Coffee or tea  _____________________________________________________________________     TAKE THESE MEDICATIONS MORNING OF SURGERY WITH A SIP OF WATER:   BUPROPION, ATORVASTATIN, FLUOXETINE, OMEPRAZOLE, HYDROXYAZINE IF NEEDED, DO NOT TAKE YOUR BLOOD PRESSURE MEDICATION MORNING OF SURGERY.  PLEASE BRING CPAP MASK TUBING AND MACHINE DAY OF SURGERY.  ONE VISITOR IS ALLOWED IN WAITING ROOM ONLY DAY OF SURGERY.  NO VISITOR MAY SPEND THE NIGHT.  VISITOR ARE ALLOWED TO STAY UNTIL 800 PM.                                    DO NOT WEAR JEWERLY, MAKE UP. DO NOT WEAR LOTIONS, POWDERS, PERFUMES OR NAIL POLISH. DO NOT SHAVE FOR 24 HOURS PRIOR TO DAY OF SURGERY. MEN MAY SHAVE FACE AND NECK. CONTACTS, GLASSES, OR DENTURES MAY NOT BE WORN TO SURGERY.                                    Anne Mcguire IS NOT RESPONSIBLE  FOR ANY BELONGINGS.                                                                    Marland Kitchen           [facility] - Preparing for Surgery Before surgery, you can play an important role.  Because skin is not sterile, your skin needs to be as free of germs as possible.  You can reduce the number of germs on your skin by washing with CHG (chlorahexidine gluconate) soap before surgery.  CHG is an antiseptic cleaner which kills germs and bonds with the  skin to continue killing germs even after washing. Please DO NOT use if you have an allergy to CHG or antibacterial soaps.  If your skin becomes reddened/irritated stop using the CHG and inform your nurse when you arrive at Short Stay. Do not shave (including legs and underarms) for at least 48 hours prior to the first CHG shower.  You may shave your face/neck. Please follow these instructions carefully:  1.  Shower with CHG Soap the night before surgery and the  morning of  Surgery.  2.  If you choose to wash your hair, wash your hair first as usual with your  normal  shampoo.  3.  After you shampoo, rinse your hair and body thoroughly to remove the  shampoo.                            4.  Use CHG as you would any other liquid soap.  You can apply chg directly  to the skin and wash                      Gently with a scrungie or clean washcloth.  5.  Apply the CHG Soap to your body ONLY FROM THE NECK DOWN.   Do not use on face/ open                           Wound or open sores. Avoid contact with eyes, ears mouth and genitals (private parts).                       Wash face,  Genitals (private parts) with your normal soap.             6.  Wash thoroughly, paying special attention to the area where your surgery  will be performed.  7.  Thoroughly rinse your body with warm water from the neck down.  8.  DO NOT shower/wash with your normal soap after using and rinsing off  the CHG Soap.                9.  Pat yourself dry with a clean towel.            10.  Wear clean pajamas.            11.  Place clean sheets on your bed the night of your first shower and do not  sleep with pets. Day of Surgery : Do not apply any lotions/deodorants the morning of surgery.  Please wear clean clothes to the hospital/surgery center.  FAILURE TO FOLLOW THESE INSTRUCTIONS MAY RESULT IN THE CANCELLATION OF YOUR SURGERY PATIENT SIGNATURE_________________________________  NURSE SIGNATURE__________________________________  ________________________________________________________________________                                                        QUESTIONS Anne Mcguire PRE OP NURSE PHONE [phone].

## 2021-06-05 ENCOUNTER — Other Ambulatory Visit (HOSPITAL_COMMUNITY)
Admission: RE | Admit: 2021-06-05 | Discharge: 2021-06-05 | Disposition: A | Discharge status: Home / Self Care | Payer: Federal, State, Local not specified - PPO | Source: Ambulatory Visit | Attending: Obstetrics & Gynecology | Admitting: Obstetrics & Gynecology

## 2021-06-05 ENCOUNTER — Other Ambulatory Visit: Payer: Self-pay

## 2021-06-05 ENCOUNTER — Encounter (HOSPITAL_COMMUNITY)
Admission: RE | Admit: 2021-06-05 | Discharge: 2021-06-05 | Disposition: A | Discharge status: Home / Self Care | Payer: Federal, State, Local not specified - PPO | Source: Ambulatory Visit | Attending: Obstetrics & Gynecology | Admitting: Obstetrics & Gynecology

## 2021-06-05 DIAGNOSIS — Z20822 Contact with and (suspected) exposure to covid-19: Secondary | ICD-10-CM | POA: Insufficient documentation

## 2021-06-05 DIAGNOSIS — Z01818 Encounter for other preprocedural examination: Secondary | ICD-10-CM | POA: Insufficient documentation

## 2021-06-05 LAB — CBC
HCT: 41.1 % (ref 36.0–46.0)
Hemoglobin: 13 g/dL (ref 12.0–15.0)
MCH: 29.5 pg (ref 26.0–34.0)
MCHC: 31.6 g/dL (ref 30.0–36.0)
MCV: 93.4 fL (ref 80.0–100.0)
Platelets: 161 10*3/uL (ref 150–400)
RBC: 4.4 MIL/uL (ref 3.87–5.11)
RDW: 14.6 % (ref 11.5–15.5)
WBC: 7.3 10*3/uL (ref 4.0–10.5)
nRBC: 0 % (ref 0.0–0.2)

## 2021-06-05 LAB — COMPREHENSIVE METABOLIC PANEL
ALT: 17 U/L (ref 0–44)
AST: 16 U/L (ref 15–41)
Albumin: 4.2 g/dL (ref 3.5–5.0)
Alkaline Phosphatase: 58 U/L (ref 38–126)
Anion gap: 5 (ref 5–15)
BUN: 18 mg/dL (ref 6–20)
CO2: 27 mmol/L (ref 22–32)
Calcium: 9 mg/dL (ref 8.9–10.3)
Chloride: 107 mmol/L (ref 98–111)
Creatinine, Ser: 0.78 mg/dL (ref 0.44–1.00)
GFR, Estimated: >60 mL/min (ref >60)
Glucose, Bld: 115 mg/dL — ABNORMAL HIGH (ref 70–99)
Potassium: 4.3 mmol/L (ref 3.5–5.1)
Sodium: 139 mmol/L (ref 135–145)
Total Bilirubin: 0.7 mg/dL (ref 0.3–1.2)
Total Protein: 7.2 g/dL (ref 6.5–8.1)

## 2021-06-05 LAB — SARS CORONAVIRUS 2 (TAT 6-24 HRS): SARS Coronavirus 2: NEGATIVE

## 2021-06-05 NOTE — H&P (Signed)
Anne Mcguire is an 50 y.o. female G68 with dysmenorrhea, menorrhagia desiring definitive management.  Patient has struggled with this c/o for many years and has had Mirena x 2 and most recently in 2018 had H/S, D&C, Novasure with BTL.  Patient did well for a couple of years but over time, symptoms have recurred.  Recent u/s was done at [facility] showed 65 cc uterine volume with 6.7 mm EMS.  0.8 cm and 1.2 cm intramural fibroids were seen.  Patient also c/o SUI which requires that she wear a daily pad.  She underwent urodynamic studies in our office which confirmed SUI.    Pertinent Gynecological History: Menses: flow is excessive with use of 6 pads or tampons on heaviest days Bleeding: regular Contraception: tubal ligation DES exposure: unknown Blood transfusions: none Sexually transmitted diseases: no past history Previous GYN Procedures: DNC and ablation   Last mammogram: normal Date: 05/11/20 Last pap: normal Date: 09/01/18 OB History: G0   Menstrual History: Menarche age: n/a Patient's last menstrual period was 05/15/2021.    Past Medical History:  Diagnosis Date   Anxiety 06/01/2021   COVID 12/2020   headache, fever, stuffy nose, loss of taste and smell x few weeks all sx resolved except taste is altered   Depression 06/01/2021   Gastric ulcer    yrs ago no current problems per pt on 06-01-2021   GERD (gastroesophageal reflux disease) 06/01/2021   Hyperlipidemia 06/01/2021   Hypertension 06/01/2021   Hypothyroidism 06/01/2021   Menorrhagia 06/01/2021   Migraines 06/01/2021   regulae headaches also @ times   Nocturia 06/01/2021   Plantar fasciitis 06/01/2021   both feet right worse than left   Sleep apnea 06/01/2021   cpap severe osa does not know cpap settings   SUI (stress urinary incontinence, female) 06/01/2021   Wears glasses 06/01/2021    Past Surgical History:  Procedure Laterality Date   CATARACT EXTRACTION, BILATERAL  07/11/2017   DILITATION &  CURRETTAGE/HYSTROSCOPY WITH NOVASURE ABLATION N/A 01/10/2017   Procedure: DILATATION & CURETTAGE/HYSTEROSCOPY WITH NOVASURE ABLATION;  Surgeon: [name], DO;  Location: [place];  Service: Gynecology;  Laterality: N/A;   LAPAROSCOPIC TUBAL LIGATION Bilateral 01/10/2017   Procedure: LAPAROSCOPIC TUBAL LIGATION with Filshie Clips;  Surgeon: [name], DO;  Location: [place];  Service: Gynecology;  Laterality: Bilateral;   WISDOM TOOTH EXTRACTION  2013    Family History  Problem Relation Age of Onset   Mental illness Mother    Arthritis Father    Hyperlipidemia Father    Hypertension Father    Diabetes Father    Arthritis Maternal Grandmother    Diabetes Maternal Grandmother    Arthritis Maternal Grandfather    Diabetes Maternal Grandfather    Arthritis Paternal Grandmother    Breast cancer Paternal Grandmother     Social History:  reports that she has never smoked. She has never used smokeless tobacco. She reports current alcohol use. She reports that she does not use drugs.  Allergies:  Allergies  Allergen Reactions   Azithromycin Hives    No medications prior to admission.    Review of Systems  Height 5\' 4"  (1.626 m), weight 112 kg, last menstrual period 05/15/2021. Physical Exam Constitutional:      Appearance: Normal appearance.  HENT:     Head: Normocephalic and atraumatic.  Pulmonary:     Effort: Pulmonary effort is normal.  Abdominal:     General: Abdomen is flat.     Palpations:  Abdomen is soft.  Musculoskeletal:        General: Normal range of motion.     Cervical back: Normal range of motion.  Skin:    General: Skin is warm and dry.  Neurological:     General: No focal deficit present.     Mental Status: She is alert and oriented to person, place, and time.  Psychiatric:        Mood and Affect: Mood normal.        Behavior: Behavior normal.    No results found for this or any previous visit (from the past 24  hour(s)).  No results found.  Assessment/Plan: 50yo G0 with dysmenorrhea, menorrhagia s/p endometrial ablation and stress urinary incontinence -Plan LAVH, B/L salpingectomy and TOT ([place]blin) -Patient is counseled re: risk of bleeding, infection, scarring and damage to surrounding structures.  She is counseled that if procedure is unable to be completed laparoscopically, will convert to abdominal procedures.  She is counseled re: recovery and expectations.  All questions were answered and patient wishes to proceed.   [name] 06/05/2021, 8:09 AM

## 2021-06-06 NOTE — Anesthesia Preprocedure Evaluation (Addendum)
Anesthesia Evaluation  Patient identified by MRN, date of birth, ID band Patient awake    Reviewed: Allergy & Precautions, H&P , NPO status , Patient's Chart, lab work & pertinent test results  Airway Mallampati: III  TM Distance: >3 FB Neck ROM: Full    Dental no notable dental hx. (+) Teeth Intact, Dental Advisory Given   Pulmonary sleep apnea and Continuous Positive Airway Pressure Ventilation ,    Pulmonary exam normal breath sounds clear to auscultation       Cardiovascular Exercise Tolerance: Good hypertension, Pt. on medications  Rhythm:Regular Rate:Normal     Neuro/Psych  Headaches, Anxiety Depression    GI/Hepatic Neg liver ROS, PUD, GERD  Medicated,  Endo/Other  Hypothyroidism Morbid obesity  Renal/GU negative Renal ROS  negative genitourinary   Musculoskeletal   Abdominal   Peds  Hematology negative hematology ROS (+)   Anesthesia Other Findings   Reproductive/Obstetrics negative OB ROS                            Anesthesia Physical Anesthesia Plan  ASA: 3  Anesthesia Plan: General   Post-op Pain Management:    Induction: Intravenous  PONV Risk Score and Plan: 3 and Ondansetron, Dexamethasone and Midazolam  Airway Management Planned: Oral ETT  Additional Equipment:   Intra-op Plan:   Post-operative Plan: Extubation in OR  Informed Consent: I have reviewed the patients History and Physical, chart, labs and discussed the procedure including the risks, benefits and alternatives for the proposed anesthesia with the patient or authorized representative who has indicated his/her understanding and acceptance.     Dental advisory given  Plan Discussed with: CRNA  Anesthesia Plan Comments:        Anesthesia Quick Evaluation

## 2021-06-07 ENCOUNTER — Encounter (HOSPITAL_BASED_OUTPATIENT_CLINIC_OR_DEPARTMENT_OTHER)
Admission: RE | Disposition: A | Discharge status: Home / Self Care | Payer: Self-pay | Source: Home / Self Care | Attending: Obstetrics & Gynecology

## 2021-06-07 ENCOUNTER — Observation Stay (HOSPITAL_BASED_OUTPATIENT_CLINIC_OR_DEPARTMENT_OTHER)
Admission: RE | Admit: 2021-06-07 | Discharge: 2021-06-08 | Disposition: A | Discharge status: Home / Self Care | Payer: Federal, State, Local not specified - PPO | Attending: Obstetrics & Gynecology | Admitting: Obstetrics & Gynecology

## 2021-06-07 ENCOUNTER — Observation Stay (HOSPITAL_BASED_OUTPATIENT_CLINIC_OR_DEPARTMENT_OTHER): Payer: Federal, State, Local not specified - PPO | Admitting: Anesthesiology

## 2021-06-07 ENCOUNTER — Other Ambulatory Visit: Payer: Self-pay

## 2021-06-07 ENCOUNTER — Encounter (HOSPITAL_BASED_OUTPATIENT_CLINIC_OR_DEPARTMENT_OTHER): Payer: Self-pay | Admitting: Obstetrics & Gynecology

## 2021-06-07 DIAGNOSIS — Z8616 Personal history of COVID-19: Secondary | ICD-10-CM | POA: Diagnosis not present

## 2021-06-07 DIAGNOSIS — E039 Hypothyroidism, unspecified: Secondary | ICD-10-CM | POA: Diagnosis not present

## 2021-06-07 DIAGNOSIS — N393 Stress incontinence (female) (male): Secondary | ICD-10-CM | POA: Diagnosis not present

## 2021-06-07 DIAGNOSIS — Z90710 Acquired absence of both cervix and uterus: Secondary | ICD-10-CM | POA: Diagnosis present

## 2021-06-07 DIAGNOSIS — I10 Essential (primary) hypertension: Secondary | ICD-10-CM | POA: Insufficient documentation

## 2021-06-07 DIAGNOSIS — N920 Excessive and frequent menstruation with regular cycle: Secondary | ICD-10-CM | POA: Diagnosis present

## 2021-06-07 DIAGNOSIS — N800 Endometriosis of uterus: Principal | ICD-10-CM | POA: Insufficient documentation

## 2021-06-07 DIAGNOSIS — N946 Dysmenorrhea, unspecified: Secondary | ICD-10-CM | POA: Diagnosis not present

## 2021-06-07 HISTORY — DX: Gastric ulcer, unspecified as acute or chronic, without hemorrhage or perforation: K25.9

## 2021-06-07 HISTORY — PX: LAPAROSCOPIC VAGINAL HYSTERECTOMY WITH SALPINGECTOMY: SHX6680

## 2021-06-07 HISTORY — PX: BLADDER SUSPENSION: SHX72

## 2021-06-07 LAB — TYPE AND SCREEN
ABO/RH(D): A POS
Antibody Screen: NEGATIVE

## 2021-06-07 LAB — POCT PREGNANCY, URINE: Preg Test, Ur: NEGATIVE

## 2021-06-07 LAB — ABO/RH: ABO/RH(D): A POS

## 2021-06-07 SURGERY — HYSTERECTOMY, VAGINAL, LAPAROSCOPY-ASSISTED, WITH SALPINGECTOMY
Anesthesia: General | Site: Abdomen

## 2021-06-07 MED ORDER — PHENYLEPHRINE HCL (PRESSORS) 10 MG/ML IV SOLN
INTRAVENOUS | Status: DC | PRN
  Administered 2021-06-07 (×2): 80 ug via INTRAVENOUS

## 2021-06-07 MED ORDER — LEVOTHYROXINE SODIUM 100 MCG PO TABS
100.0000 ug | ORAL_TABLET | Freq: Every day | ORAL | Status: DC
  Administered 2021-06-08: 100 ug via ORAL
  Filled 2021-06-07: qty 1

## 2021-06-07 MED ORDER — ONDANSETRON HCL 4 MG/2ML IJ SOLN
INTRAMUSCULAR | Status: DC | PRN
  Administered 2021-06-07: 4 mg via INTRAVENOUS

## 2021-06-07 MED ORDER — KETOROLAC TROMETHAMINE 30 MG/ML IJ SOLN
INTRAMUSCULAR | Status: AC
  Filled 2021-06-07: qty 1

## 2021-06-07 MED ORDER — DOCUSATE SODIUM 100 MG PO CAPS
100.0000 mg | ORAL_CAPSULE | Freq: Two times a day (BID) | ORAL | Status: DC
  Administered 2021-06-07 (×2): 100 mg via ORAL

## 2021-06-07 MED ORDER — OXYCODONE-ACETAMINOPHEN 5-325 MG PO TABS
ORAL_TABLET | ORAL | Status: AC
  Filled 2021-06-07: qty 1

## 2021-06-07 MED ORDER — ROCURONIUM BROMIDE 10 MG/ML (PF) SYRINGE
PREFILLED_SYRINGE | INTRAVENOUS | Status: AC
  Filled 2021-06-07: qty 10

## 2021-06-07 MED ORDER — PROPOFOL 10 MG/ML IV BOLUS
INTRAVENOUS | Status: AC
  Filled 2021-06-07: qty 40

## 2021-06-07 MED ORDER — BUPIVACAINE HCL (PF) 0.25 % IJ SOLN
INTRAMUSCULAR | Status: DC | PRN
  Administered 2021-06-07: 2 mL

## 2021-06-07 MED ORDER — EPHEDRINE 5 MG/ML INJ
INTRAVENOUS | Status: AC
  Filled 2021-06-07: qty 10

## 2021-06-07 MED ORDER — DOCUSATE SODIUM 100 MG PO CAPS
ORAL_CAPSULE | ORAL | Status: AC
  Filled 2021-06-07: qty 1

## 2021-06-07 MED ORDER — ROCURONIUM BROMIDE 100 MG/10ML IV SOLN
INTRAVENOUS | Status: DC | PRN
  Administered 2021-06-07: 20 mg via INTRAVENOUS
  Administered 2021-06-07: 80 mg via INTRAVENOUS

## 2021-06-07 MED ORDER — SODIUM CHLORIDE 0.9 % IV SOLN
INTRAVENOUS | Status: AC | PRN
  Administered 2021-06-07: 300 mL

## 2021-06-07 MED ORDER — ALBUMIN HUMAN 5 % IV SOLN
INTRAVENOUS | Status: AC
  Filled 2021-06-07: qty 500

## 2021-06-07 MED ORDER — CEFAZOLIN SODIUM-DEXTROSE 2-4 GM/100ML-% IV SOLN
INTRAVENOUS | Status: AC
  Filled 2021-06-07: qty 100

## 2021-06-07 MED ORDER — FENTANYL CITRATE (PF) 100 MCG/2ML IJ SOLN
INTRAMUSCULAR | Status: AC
  Filled 2021-06-07: qty 2

## 2021-06-07 MED ORDER — ONDANSETRON HCL 4 MG PO TABS: 4.0000 mg | ORAL_TABLET | Freq: Four times a day (QID) | ORAL | Status: DC | PRN

## 2021-06-07 MED ORDER — POVIDONE-IODINE 10 % EX SWAB: 2.0000 "application " | Freq: Once | CUTANEOUS | Status: DC

## 2021-06-07 MED ORDER — LIDOCAINE HCL (PF) 2 % IJ SOLN
INTRAMUSCULAR | Status: AC
  Filled 2021-06-07: qty 5

## 2021-06-07 MED ORDER — CEFAZOLIN SODIUM-DEXTROSE 2-4 GM/100ML-% IV SOLN
2.0000 g | INTRAVENOUS | Status: AC
  Administered 2021-06-07: 2 g via INTRAVENOUS

## 2021-06-07 MED ORDER — LACTATED RINGERS IV SOLN: INTRAVENOUS | Status: DC

## 2021-06-07 MED ORDER — MENTHOL 3 MG MT LOZG
LOZENGE | OROMUCOSAL | Status: AC
  Filled 2021-06-07: qty 9

## 2021-06-07 MED ORDER — OXYCODONE-ACETAMINOPHEN 5-325 MG PO TABS
1.0000 | ORAL_TABLET | ORAL | Status: DC | PRN
  Administered 2021-06-07: 2 via ORAL
  Administered 2021-06-07: 1 via ORAL
  Administered 2021-06-07: 2 via ORAL
  Administered 2021-06-07: 1 via ORAL
  Administered 2021-06-08 (×2): 2 via ORAL

## 2021-06-07 MED ORDER — SCOPOLAMINE 1 MG/3DAYS TD PT72
MEDICATED_PATCH | TRANSDERMAL | Status: DC | PRN
  Administered 2021-06-07: 1 via TRANSDERMAL

## 2021-06-07 MED ORDER — EPHEDRINE SULFATE 50 MG/ML IJ SOLN
INTRAMUSCULAR | Status: DC | PRN
  Administered 2021-06-07: 40 mg via INTRAVENOUS
  Administered 2021-06-07: 20 mg via INTRAVENOUS

## 2021-06-07 MED ORDER — PROPOFOL 10 MG/ML IV BOLUS
INTRAVENOUS | Status: DC | PRN
  Administered 2021-06-07 (×2): 50 mg via INTRAVENOUS
  Administered 2021-06-07: 200 mg via INTRAVENOUS

## 2021-06-07 MED ORDER — LACTATED RINGERS IV SOLN
INTRAVENOUS | Status: DC
  Administered 2021-06-07: 1 mL via INTRAVENOUS

## 2021-06-07 MED ORDER — ALBUMIN HUMAN 5 % IV SOLN: INTRAVENOUS | Status: DC | PRN

## 2021-06-07 MED ORDER — ACETAMINOPHEN 500 MG PO TABS
ORAL_TABLET | ORAL | Status: AC
  Filled 2021-06-07: qty 2

## 2021-06-07 MED ORDER — ACETAMINOPHEN 500 MG PO TABS
1000.0000 mg | ORAL_TABLET | Freq: Once | ORAL | Status: AC
  Administered 2021-06-07: 1000 mg via ORAL

## 2021-06-07 MED ORDER — FLUOXETINE HCL 40 MG PO CAPS: 40.0000 mg | ORAL_CAPSULE | Freq: Every day | ORAL | Status: DC

## 2021-06-07 MED ORDER — ONDANSETRON HCL 4 MG/2ML IJ SOLN: 4.0000 mg | Freq: Four times a day (QID) | INTRAMUSCULAR | Status: DC | PRN

## 2021-06-07 MED ORDER — LACTATED RINGERS IV SOLN
INTRAVENOUS | Status: DC
  Administered 2021-06-07: 1000 mL via INTRAVENOUS

## 2021-06-07 MED ORDER — MENTHOL 3 MG MT LOZG
1.0000 | LOZENGE | OROMUCOSAL | Status: DC | PRN
  Administered 2021-06-07: 3 mg via ORAL

## 2021-06-07 MED ORDER — LIDOCAINE HCL (CARDIAC) PF 100 MG/5ML IV SOSY
PREFILLED_SYRINGE | INTRAVENOUS | Status: DC | PRN
  Administered 2021-06-07: 100 mg via INTRAVENOUS

## 2021-06-07 MED ORDER — LIDOCAINE HCL 1 % IJ SOLN
INTRAMUSCULAR | Status: DC | PRN
  Administered 2021-06-07: 20 mL

## 2021-06-07 MED ORDER — SCOPOLAMINE 1 MG/3DAYS TD PT72
MEDICATED_PATCH | TRANSDERMAL | Status: AC
  Filled 2021-06-07: qty 1

## 2021-06-07 MED ORDER — PHENYLEPHRINE 40 MCG/ML (10ML) SYRINGE FOR IV PUSH (FOR BLOOD PRESSURE SUPPORT)
PREFILLED_SYRINGE | INTRAVENOUS | Status: AC
  Filled 2021-06-07: qty 10

## 2021-06-07 MED ORDER — ONDANSETRON HCL 4 MG/2ML IJ SOLN
INTRAMUSCULAR | Status: AC
  Filled 2021-06-07: qty 2

## 2021-06-07 MED ORDER — OXYCODONE-ACETAMINOPHEN 5-325 MG PO TABS
ORAL_TABLET | ORAL | Status: AC
  Filled 2021-06-07: qty 2

## 2021-06-07 MED ORDER — KETOROLAC TROMETHAMINE 30 MG/ML IJ SOLN
30.0000 mg | Freq: Four times a day (QID) | INTRAMUSCULAR | Status: DC
  Administered 2021-06-07 – 2021-06-08 (×3): 30 mg via INTRAVENOUS

## 2021-06-07 MED ORDER — DEXAMETHASONE SODIUM PHOSPHATE 10 MG/ML IJ SOLN
INTRAMUSCULAR | Status: AC
  Filled 2021-06-07: qty 1

## 2021-06-07 MED ORDER — SIMETHICONE 80 MG PO CHEW: 80.0000 mg | CHEWABLE_TABLET | Freq: Four times a day (QID) | ORAL | Status: DC | PRN

## 2021-06-07 MED ORDER — HYDROMORPHONE HCL 1 MG/ML IJ SOLN: 0.2500 mg | INTRAMUSCULAR | Status: DC | PRN

## 2021-06-07 MED ORDER — ACETAMINOPHEN 500 MG PO TABS
ORAL_TABLET | ORAL | Status: AC
  Filled 2021-06-07: qty 1

## 2021-06-07 MED ORDER — BUPROPION HCL ER (SR) 150 MG PO TB12: 150.0000 mg | ORAL_TABLET | Freq: Every day | ORAL | Status: DC

## 2021-06-07 MED ORDER — MIDAZOLAM HCL 2 MG/2ML IJ SOLN
INTRAMUSCULAR | Status: AC
  Filled 2021-06-07: qty 2

## 2021-06-07 MED ORDER — HYDROMORPHONE HCL 1 MG/ML IJ SOLN: 0.2000 mg | INTRAMUSCULAR | Status: DC | PRN

## 2021-06-07 MED ORDER — MIDAZOLAM HCL 5 MG/5ML IJ SOLN
INTRAMUSCULAR | Status: DC | PRN
  Administered 2021-06-07: 2 mg via INTRAVENOUS

## 2021-06-07 MED ORDER — KETOROLAC TROMETHAMINE 30 MG/ML IJ SOLN
INTRAMUSCULAR | Status: DC | PRN
  Administered 2021-06-07: 30 mg via INTRAVENOUS

## 2021-06-07 MED ORDER — SUGAMMADEX SODIUM 200 MG/2ML IV SOLN
INTRAVENOUS | Status: DC | PRN
  Administered 2021-06-07: 200 mg via INTRAVENOUS

## 2021-06-07 MED ORDER — FENTANYL CITRATE (PF) 100 MCG/2ML IJ SOLN
INTRAMUSCULAR | Status: DC | PRN
  Administered 2021-06-07: 100 ug via INTRAVENOUS
  Administered 2021-06-07: 50 ug via INTRAVENOUS
  Administered 2021-06-07: 100 ug via INTRAVENOUS
  Administered 2021-06-07: 50 ug via INTRAVENOUS

## 2021-06-07 MED ORDER — DEXAMETHASONE SODIUM PHOSPHATE 4 MG/ML IJ SOLN
INTRAMUSCULAR | Status: DC | PRN
  Administered 2021-06-07: 10 mg via INTRAVENOUS

## 2021-06-07 MED ORDER — ACETAMINOPHEN 325 MG PO TABS: 650.0000 mg | ORAL_TABLET | ORAL | Status: DC | PRN

## 2021-06-07 SURGICAL SUPPLY — 60 items
ADH SKN CLS APL DERMABOND .7 (GAUZE/BANDAGES/DRESSINGS) ×2
BLADE CLIPPER SENSICLIP SURGIC (BLADE) IMPLANT
CATH FOLEY 2WAY SLVR  5CC 16FR (CATHETERS) ×3
CATH FOLEY 2WAY SLVR 5CC 16FR (CATHETERS) ×1 IMPLANT
CATH ROBINSON RED A/P 16FR (CATHETERS) ×4 IMPLANT
COVER BACK TABLE 60X90IN (DRAPES) ×4 IMPLANT
COVER MAYO STAND STRL (DRAPES) ×8 IMPLANT
DECANTER SPIKE VIAL GLASS SM (MISCELLANEOUS) IMPLANT
DERMABOND ADVANCED (GAUZE/BANDAGES/DRESSINGS) ×1
DERMABOND ADVANCED .7 DNX12 (GAUZE/BANDAGES/DRESSINGS) ×1 IMPLANT
DRSG COVADERM PLUS 2X2 (GAUZE/BANDAGES/DRESSINGS) ×4 IMPLANT
DRSG OPSITE POSTOP 3X4 (GAUZE/BANDAGES/DRESSINGS) ×4 IMPLANT
DURAPREP 26ML APPLICATOR (WOUND CARE) ×4 IMPLANT
ELECT REM PT RETURN 9FT ADLT (ELECTROSURGICAL)
ELECTRODE REM PT RTRN 9FT ADLT (ELECTROSURGICAL) IMPLANT
GAUZE 4X4 16PLY ~~LOC~~+RFID DBL (SPONGE) ×8 IMPLANT
GLOVE SURG POLYISO LF SZ5.5 (GLOVE) ×8 IMPLANT
GLOVE SURG UNDER POLY LF SZ6 (GLOVE) ×8 IMPLANT
HOLDER FOLEY CATH W/STRAP (MISCELLANEOUS) ×4 IMPLANT
IV NS 1000ML (IV SOLUTION)
IV NS 1000ML BAXH (IV SOLUTION) IMPLANT
IV NS IRRIG 3000ML ARTHROMATIC (IV SOLUTION) IMPLANT
KIT TURNOVER CYSTO (KITS) ×4 IMPLANT
LIGASURE IMPACT 36 18CM CVD LR (INSTRUMENTS) ×2 IMPLANT
LIGASURE LAP L-HOOKWIRE 5 44CM (INSTRUMENTS) ×2 IMPLANT
MANIPULATOR UTERINE 4.5 ZUMI (MISCELLANEOUS) IMPLANT
NEEDLE INSUFFLATION 120MM (ENDOMECHANICALS) ×4 IMPLANT
NS IRRIG 500ML POUR BTL (IV SOLUTION) ×4 IMPLANT
PACK LAVH (CUSTOM PROCEDURE TRAY) ×4 IMPLANT
PACK ROBOTIC GOWN (GOWN DISPOSABLE) ×4 IMPLANT
PACK TRENDGUARD 450 HYBRID PRO (MISCELLANEOUS) IMPLANT
PAD OB MATERNITY 4.3X12.25 (PERSONAL CARE ITEMS) ×4 IMPLANT
PAD PREP 24X48 CUFFED NSTRL (MISCELLANEOUS) ×4 IMPLANT
SCISSORS LAP 5X35 DISP (ENDOMECHANICALS) IMPLANT
SET SUCTION IRRIG HYDROSURG (IRRIGATION / IRRIGATOR) IMPLANT
SET TUBE SMOKE EVAC HIGH FLOW (TUBING) ×4 IMPLANT
SLING HALO OBTRYX (Sling) ×3 IMPLANT
SLING HALO OBTRYX NDL (Sling) ×1 IMPLANT
SPONGE T-LAP 4X18 ~~LOC~~+RFID (SPONGE) ×2 IMPLANT
SUT MNCRL 0 MO-4 VIOLET 18 CR (SUTURE) ×6 IMPLANT
SUT MNCRL AB 3-0 PS2 18 (SUTURE) ×4 IMPLANT
SUT MON AB 2-0 CT1 36 (SUTURE) IMPLANT
SUT MONOCRYL 0 MO 4 18  CR/8 (SUTURE) ×6
SUT VIC AB 0 CT1 27 (SUTURE) ×6
SUT VIC AB 0 CT1 27XCR 8 STRN (SUTURE) ×2 IMPLANT
SUT VIC AB 2-0 CT1 (SUTURE) ×2 IMPLANT
SUT VICRYL 0 TIES 12 18 (SUTURE) ×4 IMPLANT
SUT VICRYL 0 UR6 27IN ABS (SUTURE) ×6 IMPLANT
SYR 10ML LL (SYRINGE) ×2 IMPLANT
SYR BULB IRRIG 60ML STRL (SYRINGE) IMPLANT
SYR CONTROL 10ML LL (SYRINGE) ×2 IMPLANT
TOWEL OR 17X26 10 PK STRL BLUE (TOWEL DISPOSABLE) ×4 IMPLANT
TRAY FOLEY W/BAG SLVR 14FR LF (SET/KITS/TRAYS/PACK) ×4 IMPLANT
TRENDGUARD 450 HYBRID PRO PACK (MISCELLANEOUS)
TROCAR 12M 150ML BLUNT (TROCAR) ×2 IMPLANT
TROCAR 5M 150ML BLDLS (TROCAR) ×2 IMPLANT
TROCAR BLADELESS OPT 5 100 (ENDOMECHANICALS) ×4 IMPLANT
TROCAR XCEL NON-BLD 11X100MML (ENDOMECHANICALS) ×4 IMPLANT
WARMER LAPAROSCOPE (MISCELLANEOUS) ×4 IMPLANT
WATER STERILE IRR 500ML POUR (IV SOLUTION) IMPLANT

## 2021-06-07 NOTE — Anesthesia Procedure Notes (Addendum)
Procedure Name: Intubation Date/Time: 06/07/2021 7:40 AM Performed by: Justice Rocher, CRNA Pre-anesthesia Checklist: Patient identified, Emergency Drugs available, Suction available, Patient being monitored and Timeout performed Patient Re-evaluated:Patient Re-evaluated prior to induction Oxygen Delivery Method: Circle system utilized Preoxygenation: Pre-oxygenation with 100% oxygen Induction Type: IV induction Ventilation: Mask ventilation without difficulty Laryngoscope Size: Mac and 3 Grade View: Grade II Tube type: Oral Tube size: 7.5 mm Number of attempts: 1 Airway Equipment and Method: Stylet Placement Confirmation: ETT inserted through vocal cords under direct vision, positive ETCO2, breath sounds checked- equal and bilateral and CO2 detector Secured at: 23 cm Tube secured with: Tape Dental Injury: Teeth and Oropharynx as per pre-operative assessment

## 2021-06-07 NOTE — Progress Notes (Signed)
Day of Surgery Procedure(s) (LRB): LAPAROSCOPIC ASSISTED VAGINAL HYSTERECTOMY WITH BILATERAL SALPINGECTOMY (Bilateral) TRANSVAGINAL TAPE (TVT) PROCEDURE (N/A)  Subjective: Patient reports tolerating PO.  Pain is well controlled with po meds.  No N/V.  No CP/SOB.  Has ambulated in hallway.  Objective: I have reviewed patient's vital signs, intake and output, and medications.  General: alert, cooperative, and appears stated age GI: incision: clean, dry, and intact Extremities: extremities normal, atraumatic, no cyanosis or edema  Assessment: s/p Procedure(s): LAPAROSCOPIC ASSISTED VAGINAL HYSTERECTOMY WITH BILATERAL SALPINGECTOMY (Bilateral) TRANSVAGINAL TAPE (TVT) PROCEDURE (N/A): stable  Plan: Advance diet Encourage ambulation Saline lock IVF and d/c catheter with voiding trial in AM AM labs pending   LOS: 0 days    [name] 06/07/2021, 5:38 PM

## 2021-06-07 NOTE — Progress Notes (Signed)
No change to H&P.  Anne Pickrell, DO 

## 2021-06-07 NOTE — H&P (Signed)
NAME: Anne Mcguire, Anne Mcguire MEDICAL RECORD NO: [id number] ACCOUNT NO: [id number] DATE OF BIRTH: [birth year] FACILITY: [facility] LOCATION: [facility] PHYSICIAN: Daleen Bo. Lyn Hollingshead, MD  History and Physical   DATE OF ADMISSION: 06/07/2021  PREOPERATIVE DIAGNOSIS:  Stress urinary incontinence.  POSTOPERATIVE DIAGNOSIS:  Stress urinary incontinence.   PROCEDURE:  Transobturator midurethral tape.  SURGEON:  Daleen Bo. Lyn Hollingshead, MD  ASSISTANT:  [name], MD  ANESTHESIA:  General with endotracheal intubation.  ESTIMATED BLOOD LOSS:  150 mL.  SPECIMENS:  None.  INDICATIONS AND CONSENT:  This patient is a 50 year old patient with stress urinary incontinence.  Options have been discussed.  Transobturator mid urethral tape has been discussed.  Potential risks and complications are reviewed preoperatively including  but not limited to infection, organ damage, bleeding requiring transfusion of blood products with HIV and hepatitis acquisition, DVT, PE, pneumonia, erosion of the sling into the vagina, urethra, or bladder, dyspareunia, urinary retention, recurrent  incontinence, need to return to the operating room, and pelvic pain.  The patient states she understands and agrees, and consent is signed and on the chart.  DESCRIPTION OF PROCEDURE:  The patient was in the operating room in the dorsal lithotomy position under general anesthesia.  Laparoscopically-assisted vaginal hysterectomy had been done by Dr. Lynnette Caffey, who had just completed closure of the vaginal cuff.   A Foley catheter was placed, and the bladder was drained, and the Foley was left in place.  The mark on the medial aspect of the pubic ramus was made bilaterally for point of the inguinal incision.  This was at the approximate level of the urethral  meatus bilaterally.  She was injected subcutaneously along the route of needle passage with 1% plain lidocaine, approximately 5 mL on both sides.  The bulb of the Foley catheter was  palpated, and the vaginal mucosa was grasped at the 3 and 9 o'clock  positions at the midpoint of the urethra.  The suburethral mucosa was then injected below the urethra as well as bilaterally along the anticipated path of dissection with another 5 mL on each side of 1% lidocaine plain.  A suburethral midline incision  was then made beginning 1 cm below the urethral meatus on the mid portion of the urethra.  This was then dissected bilaterally toward the obturator foramen.  A small stab incision was made at the marked points bilaterally in the inguinal folds.  Then,  the Obtryx needles were then passed from the inguinal incision through the obturator foramen around the pubic ramus and exited below the urethra through the suburethral incision with the examining finger guiding the passage of the needle tip.  This was  done bilaterally and careful inspection revealed no perforation of the vaginal mucosa.  These were left in place.  Foley catheter was removed, and cystoscopy was then done with a 70-degree scope.  360-degree inspection revealed no foreign bodies, no  evidence of perforation, and the needles were not visible.  The cystoscope was then removed.  Foley catheter was replaced.  Bladder was drained.  The Foley was left in place.  The sling was then anchored to the tips of the needles bilaterally, which were  then withdrawn back through the inguinal incisions.  A Kelly clamp was used to ensure that no excess tension was placed on the urethra.  The sheaths were removed bilaterally intact.  Inspection revealed the sling to be lying flat with no rolls or kinks.   A Kelly clamp placed below the sling could easily  be rotated 90 degrees to the floor with no tension.  The vaginal suburethral incision was then closed with a running locking 2-0 Vicryl suture.  Excess sling material was resected at the level of the  skin bilaterally in the inguinal incisions and both incisions were closed with glue.  Foley  catheter was in place, the bladder was draining clear urine, and at this point count was correct and Dr. Lynnette Caffey completed her procedure.   [facility] D: 06/07/2021 10:46:18 am T: 06/07/2021 1:24:00 pm  JOB: [id number]/ [id number]

## 2021-06-07 NOTE — Anesthesia Postprocedure Evaluation (Signed)
Anesthesia Post Note  Patient: ELAURA CALIX  Procedure(s) Performed: LAPAROSCOPIC ASSISTED VAGINAL HYSTERECTOMY WITH BILATERAL SALPINGECTOMY (Bilateral: Abdomen) TRANSVAGINAL TAPE (TVT) PROCEDURE (Vagina )     Patient location during evaluation: PACU Anesthesia Type: General Level of consciousness: awake and alert Pain management: pain level controlled Vital Signs Assessment: post-procedure vital signs reviewed and stable Respiratory status: spontaneous breathing, nonlabored ventilation and respiratory function stable Cardiovascular status: blood pressure returned to baseline and stable Postop Assessment: no apparent nausea or vomiting Anesthetic complications: no   No notable events documented.  Last Vitals:  Vitals:   06/07/21 1245 06/07/21 1300  BP: (!) 141/79 137/79  Pulse: 83 88  Resp: 11 14  Temp: 36.6 C 36.8 C  SpO2: 93% 98%    Last Pain:  Vitals:   06/07/21 1300  TempSrc:   PainSc: 3                  Marshun Duva,W. EDMOND

## 2021-06-07 NOTE — Op Note (Signed)
PROCEDURE DATE: 06/07/2021 PREOPERATIVE DIAGNOSIS: Menorrhagia, dysmenorrhea, stress urinary incontinence POSTOPERATIVE DIAGNOSIS: The same  PROCEDURE: Laparoscopic Assisted Vaginal Hysterectomy, Bilateral salpingectomy, transobturator mid-urethral tape, cystoscopy SURGEON: Dr. [name]  ASSISTANT: Dr. Joycelyn Schmid INDICATIONS: 50 y.o. G0 with menorrhagia and dysmenorrhea, stress urinary incontinence desiring definitive surgical management. Risks of surgery were discussed with the patient including but not limited to: bleeding which may require transfusion or reoperation; infection which may require antibiotics; injury to bowel, bladder, ureters or other surrounding organs; need for additional procedures including laparotomy; thromboembolic phenomenon, incisional problems and other postoperative/anesthesia complications. Written informed consent was obtained.  FINDINGS: Small uterus, normal adnexa bilaterally with Filshie clips in place. No evidence of endometriosis. Normal upper abdomen.   ANESTHESIA: General  ESTIMATED BLOOD LOSS: 300 ml  SPECIMENS: Uterus and cervix, bilateral fallopian tubes and Filshie clips  COMPLICATIONS: None immediate  PROCEDURE IN DETAIL: The patient received intravenous antibiotics and had sequential compression devices applied to her lower extremities while in the preoperative area. She was then taken to the operating room where general anesthesia was administered and was found to be adequate. She was placed in the dorsal lithotomy position, and was prepped and draped in a sterile manner. An in and out catheterization was performed for 40 cc urine. A Hulka uterine manipulator was then advanced into the uterus . After an adequate timeout was performed, attention was then turned to the patient's abdomen where a 10-mm skin incision was made in the umbilical fold. At this time, anesthesia expressed concern for inaccuracy of blood pressure monitoring.  The readings were high  and did not improve with propofol.  The right arm with cuff was untucked and hypotension was noted.  It was assumed that secondary to volume depletion with NPO, replacement was needed.  She received albumin and ephedrine which improved blood pressure readings into normal range.  The Veress needle was carefully introduced into the peritoneal cavity through the abdominal wall. Intraperitoneal placement was confirmed by drop in intraabdominal pressure with insufflation of carbon dioxide gas. Adequate pneumoperitoneum was obtained, and the 12 trocar and sleeve were then advanced without difficulty into the abdomen where intraabdominal placement was confirmed by the laparoscope. A survey of the patient's pelvis and abdomen revealed entirely normal anatomy with bilateral Filshie clips.  Suprapubic 31mm port was then placed under direct visualization. The pelvis was then carefully examined. On the right side, the fallopian tube was elevated and using Ligasure, the distal tuba segment was removed in serial clamp, cauterize, cut fashion.  This tubal segment was removed as was the Filshie clip through the umbilical port.  The round ligament was then clamped and transected with the Ligasure. The uteroovarian ligament was also clamped and transected. These procedures were then repeated on the left side. The ureters were noted to be safely away from the area of dissection.  At this point, attention was turned to the vaginal portion of the case. A weighted speculum was placed posteriorly, a Deaver anteriorly, and the cervix grasped with a thyroid tenaculum. Once the anterior and posterior reflections were identified, the cervix was circumscribed using the Bovie knife. Next, using Mayos, the posterior cul-de-sac was entered. The LigaSure was then used to grasp the uterosacrals which were coapted and cut. Next, the bladder reflection was identified. Using Metzenbaums, it was entered and palpation and direct visualization confirmed  proper location. Next, using the LigaSure, the uterine arteries were coapted and cut bilaterally. The pedicles were visualized after coaptation and were hemostatic. The same was  performed sequentially cephalad until the uterus and cervix were removed. The right pedicle was oozing and a figure of eight 0 Vicryl stitch was placed for hemostasis.  The pedicles were inspected and found to be hemostatic.  Next, the uterosacrals were tagged with 0 Vicryl bilaterally. The uterosacrals were tied together in the midline cuff closure.  The remainder of the cuff closure was performed with 0 Vicryl figure of eight stitches. The cuff was inspected and found to be hemostatic.  Dr. Gaetano Net proceeded with TOT sling placement and cysto at this time.  Please see his dictation for this portion of the procedure.  Foley catheter was placed at the conclusion of the vaginal portion of the case. Attention was returned to the abdomen were a second laparoscopic look was taken. All pedicles were hemostatic. Insufflation was removed after all instruments were removed.  Infraumbilical fascia was closed with 0 vicryl in a figure of eight stitch.  Both skin incisions were closed with 4-0 monocryl subcuticular stitches and Dermabond. The patient tolerated the procedures well. All instruments, needles, and sponge counts were correct x 2. The patient was taken to the recovery room awake, extubated and in stable condition.

## 2021-06-07 NOTE — Progress Notes (Signed)
06/07/2021  10:37 AM  PATIENT:  Anne Mcguire  49 y.o. female  PRE-OPERATIVE DIAGNOSIS:   SUI  POST-OPERATIVE DIAGNOSIS: SUI  PROCEDURE:  transoburator midurethral tape  SURGEON:  Surgeon(s) and Role:    *Promise Bushong  PHYSICIAN ASSISTANT: Morris  ASSISTANTS: none   ANESTHESIA:   general  EBL:  150  BLOOD ADMINISTERED:none  DRAINS: Urinary Catheter (Foley)   LOCAL MEDICATIONS USED:  LIDOCAINE  and Amount: 20 ml  SPECIMEN:  No Specimen  DISPOSITION OF SPECIMEN:  N/A  COUNTS:  YES  TOURNIQUET:  * No tourniquets in log *  DICTATION: .Other Dictation: Dictation Number [id number]  PLAN OF CARE: Admit for overnight observation  PATIENT DISPOSITION:  PACU - hemodynamically stable.   Delay start of Pharmacological VTE agent (>24hrs) due to surgical blood loss or risk of bleeding: not applicable

## 2021-06-07 NOTE — Transfer of Care (Signed)
Immediate Anesthesia Transfer of Care Note  Patient: Anne Mcguire  Procedure(s) Performed: Procedure(s) (LRB): LAPAROSCOPIC ASSISTED VAGINAL HYSTERECTOMY WITH BILATERAL SALPINGECTOMY (Bilateral) TRANSVAGINAL TAPE (TVT) PROCEDURE (N/A)  Patient Location: PACU  Anesthesia Type: General  Level of Consciousness: awake, sedated, patient cooperative and responds to stimulation  Airway & Oxygen Therapy: Patient Spontanous Breathing and Patient connected to [place] 02 and soft FM   Post-op Assessment: Report given to PACU RN, Post -op Vital signs reviewed and stable and Patient moving all extremities  Post vital signs: Reviewed and stable  Complications: No apparent anesthesia complications

## 2021-06-08 DIAGNOSIS — N800 Endometriosis of uterus: Secondary | ICD-10-CM | POA: Diagnosis not present

## 2021-06-08 DIAGNOSIS — E039 Hypothyroidism, unspecified: Secondary | ICD-10-CM | POA: Diagnosis not present

## 2021-06-08 DIAGNOSIS — Z8616 Personal history of COVID-19: Secondary | ICD-10-CM | POA: Diagnosis not present

## 2021-06-08 DIAGNOSIS — I10 Essential (primary) hypertension: Secondary | ICD-10-CM | POA: Diagnosis not present

## 2021-06-08 DIAGNOSIS — N393 Stress incontinence (female) (male): Secondary | ICD-10-CM | POA: Diagnosis not present

## 2021-06-08 DIAGNOSIS — N946 Dysmenorrhea, unspecified: Secondary | ICD-10-CM | POA: Diagnosis not present

## 2021-06-08 LAB — SURGICAL PATHOLOGY

## 2021-06-08 LAB — CBC
HCT: 33.7 % — ABNORMAL LOW (ref 36.0–46.0)
Hemoglobin: 10.3 g/dL — ABNORMAL LOW (ref 12.0–15.0)
MCH: 29.4 pg (ref 26.0–34.0)
MCHC: 30.6 g/dL (ref 30.0–36.0)
MCV: 96.3 fL (ref 80.0–100.0)
Platelets: 153 10*3/uL (ref 150–400)
RBC: 3.5 MIL/uL — ABNORMAL LOW (ref 3.87–5.11)
RDW: 14.5 % (ref 11.5–15.5)
WBC: 14.9 10*3/uL — ABNORMAL HIGH (ref 4.0–10.5)
nRBC: 0 % (ref 0.0–0.2)

## 2021-06-08 LAB — COMPREHENSIVE METABOLIC PANEL
ALT: 14 U/L (ref 0–44)
AST: 18 U/L (ref 15–41)
Albumin: 3.5 g/dL (ref 3.5–5.0)
Alkaline Phosphatase: 41 U/L (ref 38–126)
Anion gap: 7 (ref 5–15)
BUN: 12 mg/dL (ref 6–20)
CO2: 24 mmol/L (ref 22–32)
Calcium: 8.3 mg/dL — ABNORMAL LOW (ref 8.9–10.3)
Chloride: 105 mmol/L (ref 98–111)
Creatinine, Ser: 0.65 mg/dL (ref 0.44–1.00)
GFR, Estimated: >60 mL/min (ref >60)
Glucose, Bld: 148 mg/dL — ABNORMAL HIGH (ref 70–99)
Potassium: 4.1 mmol/L (ref 3.5–5.1)
Sodium: 136 mmol/L (ref 135–145)
Total Bilirubin: 0.5 mg/dL (ref 0.3–1.2)
Total Protein: 5.9 g/dL — ABNORMAL LOW (ref 6.5–8.1)

## 2021-06-08 MED ORDER — OXYCODONE-ACETAMINOPHEN 5-325 MG PO TABS
ORAL_TABLET | ORAL | Status: AC
  Filled 2021-06-08: qty 2

## 2021-06-08 MED ORDER — KETOROLAC TROMETHAMINE 30 MG/ML IJ SOLN
INTRAMUSCULAR | Status: AC
  Filled 2021-06-08: qty 1

## 2021-06-08 MED ORDER — IBUPROFEN 800 MG PO TABS
800.0000 mg | ORAL_TABLET | Freq: Four times a day (QID) | ORAL | 0 refills | Status: DC | PRN
Start: 2021-06-08 — End: 2021-09-27

## 2021-06-08 MED ORDER — OXYCODONE-ACETAMINOPHEN 5-325 MG PO TABS: 1.0000 | ORAL_TABLET | ORAL | 0 refills | Status: DC | PRN

## 2021-06-08 NOTE — Discharge Instructions (Signed)
Call MD for T>100.4, heavy vaginal bleeding, severe abdominal pain, intractable nausea and/or vomiting, or respiratory distress.  Call office to schedule postop appointment in 2 weeks.  Pelvic rest x 6 weeks.  No driving while taking narcotics. 

## 2021-06-08 NOTE — Discharge Summary (Signed)
Physician Discharge Summary  Patient ID: Anne Mcguire MRN: [id number] DOB/AGE: [birth year] 50 y.o.  Admit date: 06/07/2021 Discharge date: 06/08/2021  Admission Diagnoses: Heavy menstrual bleeding, dysmenorrhea  Discharge Diagnoses:  Active Problems:   Menorrhagia   S/P laparoscopic assisted vaginal hysterectomy (LAVH)   Discharged Condition: good  Hospital Course: Patient was admitted on 6/29 for anticipated LAVH, Bilateral salpingectomy, TOT sling, cysto for definitive treatment of heavy menstrual bleeding, dysmenorrhea and stress urinary incontinence.  She underwent the anticipate procedure without complication.  On POD#1, patient was meeting all goals.  She was tolerating full diet, ambulating well, pain well controlled with po medication, voiding without difficulty and passing flatus.  She was discharged home with in office follow up in 2 weeks.    Consults: None  Significant Diagnostic Studies: none  Treatments: surgery: LAVH, BS, TOT, cysto  Discharge Exam: Blood pressure 130/73, pulse 74, temperature 98.3 F (36.8 C), resp. rate 16, height 5\' 4"  (1.626 m), weight 110.4 kg, last menstrual period 05/15/2021, SpO2 98 %. General appearance: alert, cooperative, and appears stated age Extremities: extremities normal, atraumatic, no cyanosis or edema Incision/Wound: Abd: soft, ND.  Incisions c/d/I x 2  Disposition: Discharge disposition: 01-Home or Self Care       Discharge Instructions     Discharge patient   Complete by: As directed    Discharge disposition: 01-Home or Self Care   Discharge patient date: 06/08/2021      Allergies as of 06/08/2021       Reactions   Azithromycin Hives        Medication List     TAKE these medications    atorvastatin 10 MG tablet Commonly known as: LIPITOR Take 1 tablet (10 mg total) by mouth daily.   buPROPion 150 MG 12 hr tablet Commonly known as: WELLBUTRIN SR Take 1 tablet (150 mg total) by mouth daily.    FLUoxetine 40 MG capsule Commonly known as: PROZAC Take 1 capsule (40 mg total) by mouth daily.   hydrOXYzine 10 MG tablet Commonly known as: ATARAX/VISTARIL TAKE ONE TABLET BY MOUTH DAILY AS NEEDED   ibuprofen 800 MG tablet Commonly known as: ADVIL Take 1 tablet (800 mg total) by mouth every 6 (six) hours as needed.   lisinopril-hydrochlorothiazide 20-12.5 MG tablet Commonly known as: Zestoretic Take 1 tablet by mouth daily.   omeprazole 20 MG capsule Commonly known as: PRILOSEC Take 1 capsule (20 mg total) by mouth daily.   oxyCODONE-acetaminophen 5-325 MG tablet Commonly known as: PERCOCET/ROXICET Take 1 tablet by mouth every 4 (four) hours as needed for moderate pain.       ASK your doctor about these medications    levothyroxine 100 MCG tablet Commonly known as: SYNTHROID Take 1 tablet (100 mcg total) by mouth daily before breakfast.   meloxicam 15 MG tablet Commonly known as: MOBIC Take 1 tablet (15 mg total) by mouth daily.         Signed: [name] 06/08/2021, 7:08 AM

## 2021-06-09 ENCOUNTER — Encounter (HOSPITAL_BASED_OUTPATIENT_CLINIC_OR_DEPARTMENT_OTHER): Payer: Self-pay | Admitting: Obstetrics & Gynecology

## 2021-06-29 DIAGNOSIS — G4733 Obstructive sleep apnea (adult) (pediatric): Secondary | ICD-10-CM | POA: Diagnosis not present

## 2021-07-14 ENCOUNTER — Other Ambulatory Visit: Payer: Self-pay | Admitting: Internal Medicine

## 2021-07-14 DIAGNOSIS — F32A Depression, unspecified: Secondary | ICD-10-CM

## 2021-07-14 DIAGNOSIS — F419 Anxiety disorder, unspecified: Secondary | ICD-10-CM

## 2021-07-14 NOTE — Telephone Encounter (Signed)
Last office visit 03/09/2021 for HTN with R. Baity.  Last refilled 04/12/2021 for #90 with no refills.  No TOC appointments.

## 2021-07-30 DIAGNOSIS — G4733 Obstructive sleep apnea (adult) (pediatric): Secondary | ICD-10-CM | POA: Diagnosis not present

## 2021-08-25 ENCOUNTER — Other Ambulatory Visit: Payer: Self-pay | Admitting: Internal Medicine

## 2021-08-25 DIAGNOSIS — K219 Gastro-esophageal reflux disease without esophagitis: Secondary | ICD-10-CM

## 2021-08-26 NOTE — Telephone Encounter (Signed)
Pt of [facility]-

## 2021-08-28 NOTE — Telephone Encounter (Signed)
Refill request Omeprazole Last office visit 03/09/21 Last refill 04/12/21 #90 No upcoming appointment scheduled

## 2021-08-30 DIAGNOSIS — G4733 Obstructive sleep apnea (adult) (pediatric): Secondary | ICD-10-CM | POA: Diagnosis not present

## 2021-09-19 ENCOUNTER — Other Ambulatory Visit: Payer: Self-pay | Admitting: Internal Medicine

## 2021-09-25 ENCOUNTER — Other Ambulatory Visit: Payer: Self-pay | Admitting: Internal Medicine

## 2021-09-25 NOTE — Telephone Encounter (Signed)
Requested Prescriptions  Pending Prescriptions Disp Refills  . levothyroxine (SYNTHROID) 100 MCG tablet [Pharmacy Med Name: LEVOTHYROXINE SODIUM 100 MCG TAB] 90 tablet 0    Sig: TAKE 1 TABLET BY MOUTH EVERY MORNING WITH BREAKFAST     Endocrinology:  Hypothyroid Agents Failed - 09/25/2021  8:34 PM      Failed - TSH needs to be rechecked within 3 months after an abnormal result. Refill until TSH is due.      Failed - Valid encounter within last 12 months    Recent Outpatient Visits   None     Future Appointments            In 2 days Baity, Coralie Keens, NP [facility], [place] - TSH in normal range and within 360 days    TSH  Date Value Ref Range Status  02/23/2021 1.08 0.35 - 4.50 uIU/mL Final         . atorvastatin (LIPITOR) 10 MG tablet [Pharmacy Med Name: ATORVASTATIN CALCIUM 10 MG TAB] 90 tablet 0    Sig: TAKE 1 TABLET BY MOUTH ONCE DAILY     Cardiovascular:  Antilipid - Statins Failed - 09/25/2021  8:34 PM      Failed - Total Cholesterol in normal range and within 360 days    Cholesterol  Date Value Ref Range Status  03/25/2020 155 0 - 200 mg/dL Final    Comment:    ATP III Classification       Desirable:  < 200 mg/dL               Borderline High:  200 - 239 mg/dL          High:  > = 240 mg/dL         Failed - LDL in normal range and within 360 days    LDL Cholesterol  Date Value Ref Range Status  03/25/2020 83 0 - 99 mg/dL Final         Failed - HDL in normal range and within 360 days    HDL  Date Value Ref Range Status  03/25/2020 50.60 >39.00 mg/dL Final         Failed - Triglycerides in normal range and within 360 days    Triglycerides  Date Value Ref Range Status  03/25/2020 107.0 0.0 - 149.0 mg/dL Final    Comment:    Normal:  <150 mg/dLBorderline High:  150 - 199 mg/dL         Failed - Valid encounter within last 12 months    Recent Outpatient Visits   None     Future Appointments            In 2 days Baity,  Coralie Keens, NP [facility], [place] - Patient is not pregnant      . FLUoxetine (PROZAC) 40 MG capsule [Pharmacy Med Name: FLUOXETINE HCL 40 MG CAP] 90 capsule 0    Sig: TAKE 1 CAPSULE BY MOUTH ONCE DAILY     Psychiatry:  Antidepressants - SSRI Failed - 09/25/2021  8:34 PM      Failed - Completed PHQ-2 or PHQ-9 in the last 360 days      Failed - Valid encounter within last 6 months    Recent Outpatient Visits   None     Future Appointments  In 2 days Baity, Coralie Keens, NP [facility], [facility]

## 2021-09-27 ENCOUNTER — Other Ambulatory Visit: Payer: Self-pay

## 2021-09-27 ENCOUNTER — Ambulatory Visit (INDEPENDENT_AMBULATORY_CARE_PROVIDER_SITE_OTHER): Payer: Federal, State, Local not specified - PPO | Admitting: Internal Medicine

## 2021-09-27 ENCOUNTER — Encounter: Payer: Self-pay | Admitting: Internal Medicine

## 2021-09-27 VITALS — BP 135/75 | HR 89 | Temp 98.0°F | Resp 17 | Ht 64.0 in | Wt 251.8 lb

## 2021-09-27 DIAGNOSIS — E039 Hypothyroidism, unspecified: Secondary | ICD-10-CM

## 2021-09-27 DIAGNOSIS — R8290 Unspecified abnormal findings in urine: Secondary | ICD-10-CM

## 2021-09-27 DIAGNOSIS — K219 Gastro-esophageal reflux disease without esophagitis: Secondary | ICD-10-CM

## 2021-09-27 DIAGNOSIS — F5101 Primary insomnia: Secondary | ICD-10-CM

## 2021-09-27 DIAGNOSIS — R7303 Prediabetes: Secondary | ICD-10-CM

## 2021-09-27 DIAGNOSIS — G43C1 Periodic headache syndromes in child or adult, intractable: Secondary | ICD-10-CM

## 2021-09-27 DIAGNOSIS — F32A Depression, unspecified: Secondary | ICD-10-CM

## 2021-09-27 DIAGNOSIS — D500 Iron deficiency anemia secondary to blood loss (chronic): Secondary | ICD-10-CM | POA: Diagnosis not present

## 2021-09-27 DIAGNOSIS — E6609 Other obesity due to excess calories: Secondary | ICD-10-CM | POA: Insufficient documentation

## 2021-09-27 DIAGNOSIS — G4730 Sleep apnea, unspecified: Secondary | ICD-10-CM

## 2021-09-27 DIAGNOSIS — I10 Essential (primary) hypertension: Secondary | ICD-10-CM

## 2021-09-27 DIAGNOSIS — F419 Anxiety disorder, unspecified: Secondary | ICD-10-CM

## 2021-09-27 DIAGNOSIS — R3915 Urgency of urination: Secondary | ICD-10-CM | POA: Diagnosis not present

## 2021-09-27 DIAGNOSIS — Z23 Encounter for immunization: Secondary | ICD-10-CM | POA: Diagnosis not present

## 2021-09-27 DIAGNOSIS — E7800 Pure hypercholesterolemia, unspecified: Secondary | ICD-10-CM

## 2021-09-27 DIAGNOSIS — L918 Other hypertrophic disorders of the skin: Secondary | ICD-10-CM

## 2021-09-27 LAB — POCT URINALYSIS DIPSTICK
Bilirubin, UA: 5
Glucose, UA: NEGATIVE
Ketones, UA: NEGATIVE
Leukocytes, UA: NEGATIVE
Nitrite, UA: NEGATIVE
Protein, UA: NEGATIVE
Spec Grav, UA: 1.01 (ref 1.010–1.025)
Urobilinogen, UA: 0.2 E.U./dL
pH, UA: 5 (ref 5.0–8.0)

## 2021-09-27 MED ORDER — HYDROXYZINE HCL 10 MG PO TABS: 10.0000 mg | ORAL_TABLET | Freq: Every day | ORAL | 0 refills | Status: DC | PRN

## 2021-09-27 MED ORDER — FLUOXETINE HCL 40 MG PO CAPS: 40.0000 mg | ORAL_CAPSULE | Freq: Every day | ORAL | 1 refills | Status: DC

## 2021-09-27 NOTE — Assessment & Plan Note (Signed)
Encouraged weight loss as this can help reduce sleep apnea symptoms Continue CPAP 

## 2021-09-27 NOTE — Assessment & Plan Note (Signed)
Deteriorated since she has been out of her Fluoxetine, refilled today Continue Wellbutrin and Hydroxyzine Support offered

## 2021-09-27 NOTE — Patient Instructions (Signed)
Skin Tag, Adult  A skin tag (acrochordon) is a soft, extra growth of skin. Most skin tags are skin-colored and rarely bigger than a pencil eraser. They commonly form in areas where there is frequent rubbing, or friction, on the skin. This may be where there are folds in the skin, such as the eyelids, neck, armpit, or groin. Skin tags are not dangerous, and they do not spread from person to person (are not contagious). You may have one skin tag or several. Skin tags do not require treatment. However, your health care provider may recommend removal of a skin tag if it: Gets irritated from clothing or jewelry. Bleeds. Is visible and unsightly. What are the causes? This condition is linked with: Increasing age. Pregnancy. Diabetes. Obesity. What are the signs or symptoms? Skin tags usually do not cause symptoms unless they get irritated by items touching your skin, such as clothing or jewelry. When this happens, you may have pain, itching, or bleeding. How is this diagnosed? This condition is diagnosed with an evaluation from your health care provider. No testing is needed for diagnosis. How is this treated? Treatment for this condition depends on whether you have symptoms. If a skin tag needs to be removed, your health care provider can remove it with: A simple surgical procedure using scissors. A procedure that involves freezing your skin tag with a gas in liquid form (liquid nitrogen). A procedure that uses heat to destroy your skin tag (electrodessication). Your health care provider may also remove your skin tag if it is visible or unsightly, Follow these instructions at home: Watch for any changes in your skin tag. A normal skin tag does not require any other special care at home. Take over-the-counter and prescription medicines only as told by your health care provider. Keep all follow-up visits as told by your health care provider. This is important. Contact a health care provider  if: You have a skin tag that: Becomes painful. Changes color. Bleeds. Swells. Summary Skin tags are soft, extra growths of skin found in areas of frequent rubbing or friction. Skin tags usually do not cause symptoms. If symptoms occur, you may have pain, itching, or bleeding. If your skin tag causes symptoms or is unsightly, your health care provider can remove it. This information is not intended to replace advice given to you by your health care provider. Make sure you discuss any questions you have with your health care provider. Document Revised: 09/28/2019 Document Reviewed: 09/28/2019 Elsevier Patient Education  2022 Elsevier Inc.  

## 2021-09-27 NOTE — Progress Notes (Signed)
Subjective:    Patient ID: Anne Mcguire, female    DOB: 01/21/1971, 50 y.o.   MRN: [id number]  HPI  Patient presents the clinic today for follow-up of chronic conditions.  HTN: Her BP today is 135/75.  She is taking Lisinopril HCT as prescribed.  ECG from 05/2021 reviewed.  Anxiety and Depression: She reports her depression is controlled but her anxiety has been worse since she ran out of her Fluoxetine 3 weeks ago.  She has been taking Wellbutrin and Hydroxyzine as prescribed.  She is not seeing a therapist.  She denies SI/HI.  Insomnia: She has difficulty falling asleep but reports this has not really been an issue lately.  She is not taking any medications for this at this time.  Sleep study from 08/2018 reviewed.  GERD: History of PUD.  Triggered by spicy foods, sausage and tomato based foods.  She denies breakthrough on Omeprazole.  There is no upper GI on file.  HLD: Her last LDL was 83, triglycerides 107, 03/2020.  She denies myalgias on Atorvastatin.  She tries to consume a low-fat diet.  Hypothyroidism: She denies any issues on her current dose of Levothyroxine.  She does not follow with endocrinology.  Migraines: These occur rarely.  She takes Excedrin Migraine as needed with good relief of symptoms.  She does not follow with neurology.  Anemia: Her last H/H was 10.3/33.7, 05/2021.  She is not taking any iron at this time.  She does not follow with hematology.  Prediabetes: Her last A1c was 6.1%, 05/2021.  She is not taking any oral diabetic medication at this time.  She does not check her sugars.  OSA: She is wearing her CPAP as instructed.  Sleep study from 08/2018 reviewed.  She also reports a lesion that was bleeding to her right lower abdomen.  She is not sure how long this has been there.  She has not tried anything OTC for this.  Review of Systems     Past Medical History:  Diagnosis Date   Anxiety 06/01/2021   COVID 12/2020   headache, fever, stuffy nose, loss  of taste and smell x few weeks all sx resolved except taste is altered   Depression 06/01/2021   Gastric ulcer    yrs ago no current problems per pt on 06-01-2021   GERD (gastroesophageal reflux disease) 06/01/2021   Hyperlipidemia 06/01/2021   Hypertension 06/01/2021   Hypothyroidism 06/01/2021   Menorrhagia 06/01/2021   Migraines 06/01/2021   regulae headaches also @ times   Nocturia 06/01/2021   Plantar fasciitis 06/01/2021   both feet right worse than left   Sleep apnea 06/01/2021   cpap severe osa does not know cpap settings   SUI (stress urinary incontinence, female) 06/01/2021   Wears glasses 06/01/2021    Current Outpatient Medications  Medication Sig Dispense Refill   atorvastatin (LIPITOR) 10 MG tablet TAKE 1 TABLET BY MOUTH ONCE DAILY 90 tablet 0   buPROPion (WELLBUTRIN SR) 150 MG 12 hr tablet TAKE 1 TABLET BY MOUTH ONCE DAILY 90 tablet 0   FLUoxetine (PROZAC) 40 MG capsule TAKE 1 CAPSULE BY MOUTH ONCE DAILY 90 capsule 0   hydrOXYzine (ATARAX/VISTARIL) 10 MG tablet TAKE ONE TABLET BY MOUTH DAILY AS NEEDED 30 tablet 0   ibuprofen (ADVIL) 800 MG tablet Take 1 tablet (800 mg total) by mouth every 6 (six) hours as needed. 60 tablet 0   levothyroxine (SYNTHROID) 100 MCG tablet TAKE 1 TABLET BY MOUTH EVERY MORNING WITH BREAKFAST  90 tablet 0   lisinopril-hydrochlorothiazide (ZESTORETIC) 20-12.5 MG tablet Take 1 tablet by mouth daily. 90 tablet 0   meloxicam (MOBIC) 15 MG tablet Take 1 tablet (15 mg total) by mouth daily. (Patient taking differently: Take 15 mg by mouth as needed.) 30 tablet 1   omeprazole (PRILOSEC) 20 MG capsule TAKE 1 CAPSULE BY MOUTH ONCE DAILY 90 capsule 0   oxyCODONE-acetaminophen (PERCOCET/ROXICET) 5-325 MG tablet Take 1 tablet by mouth every 4 (four) hours as needed for moderate pain. 30 tablet 0   No current facility-administered medications for this visit.    Allergies  Allergen Reactions   Azithromycin Hives    Family History  Problem Relation  Age of Onset   Mental illness Mother    Arthritis Father    Hyperlipidemia Father    Hypertension Father    Diabetes Father    Arthritis Maternal Grandmother    Diabetes Maternal Grandmother    Arthritis Maternal Grandfather    Diabetes Maternal Grandfather    Arthritis Paternal Grandmother    Breast cancer Paternal Grandmother     Social History   Socioeconomic History   Marital status: Married    Spouse name: Not on file   Number of children: Not on file   Years of education: Not on file   Highest education level: Not on file  Occupational History   Not on file  Tobacco Use   Smoking status: Never   Smokeless tobacco: Never  Vaping Use   Vaping Use: Never used  Substance and Sexual Activity   Alcohol use: Yes    Comment: occasional   Drug use: No   Sexual activity: Yes  Other Topics Concern   Not on file  Social History Narrative   Not on file   Social Determinants of Health   Financial Resource Strain: Not on file  Food Insecurity: Not on file  Transportation Needs: Not on file  Physical Activity: Not on file  Stress: Not on file  Social Connections: Not on file  Intimate Partner Violence: Not on file     Constitutional: Patient reports intermittent headaches.  Denies fever, malaise, fatigue, or abrupt weight changes.  HEENT: Denies eye pain, eye redness, ear pain, ringing in the ears, wax buildup, runny nose, nasal congestion, bloody nose, or sore throat. Respiratory: Denies difficulty breathing, shortness of breath, cough or sputum production.   Cardiovascular: Denies chest pain, chest tightness, palpitations or swelling in the hands or feet.  Gastrointestinal: Denies abdominal pain, bloating, constipation, diarrhea or blood in the stool.  GU: Patient reports urinary urgency.  Denies frequency, pain with urination, burning sensation, blood in urine, odor or discharge. Musculoskeletal: Denies decrease in range of motion, difficulty with gait, muscle pain  or joint pain and swelling.  Skin: Patient reports skin lesion of right lower abdomen.  Denies redness, rashes, or ulcercations.  Neurological: Patient reports insomnia.  Denies dizziness, difficulty with memory, difficulty with speech or problems with balance and coordination.  Psych: Patient has a history of anxiety and depression.  Denies SI/HI.  No other specific complaints in a complete review of systems (except as listed in HPI above).  Objective:   Physical Exam BP 135/75 (BP Location: Right Arm, Patient Position: Sitting, Cuff Size: Large)   Pulse 89   Temp 98 F (36.7 C) (Temporal)   Resp 17   Ht 5\' 4"  (1.626 m)   Wt 251 lb 12.8 oz (114.2 kg)   SpO2 100%   BMI 43.22 kg/m  Wt Readings from Last 3 Encounters:  06/07/21 243 lb 4.8 oz (110.4 kg)  06/05/21 242 lb 6 oz (109.9 kg)  03/09/21 252 lb (114.3 kg)    General: Appears her stated age, obese, in NAD. Skin: Warm, dry and intact.  Skin tag noted to right lower abdomen. HEENT: Head: normal shape and size; Eyes: EOMs intact;  Neck:  Neck supple, trachea midline. Cardiovascular: Normal rate and rhythm. S1,S2 noted.  No murmur, rubs or gallops noted. No JVD or BLE edema. No carotid bruits noted. Pulmonary/Chest: Normal effort and positive vesicular breath sounds. No respiratory distress. No wheezes, rales or ronchi noted.  Abdomen: Soft and nontender.  Musculoskeletal: No difficulty with gait.  Neurological: Alert and oriented.  Psychiatric: Mood and affect normal.  Mildly anxious appearing.  Judgment and thought content normal.     BMET    Component Value Date/Time   NA 136 06/08/2021 0134   K 4.1 06/08/2021 0134   CL 105 06/08/2021 0134   CO2 24 06/08/2021 0134   GLUCOSE 148 (H) 06/08/2021 0134   BUN 12 06/08/2021 0134   CREATININE 0.65 06/08/2021 0134   CALCIUM 8.3 (L) 06/08/2021 0134   GFRNONAA >60 06/08/2021 0134    Lipid Panel     Component Value Date/Time   CHOL 155 03/25/2020 0749   TRIG 107.0  03/25/2020 0749   HDL 50.60 03/25/2020 0749   CHOLHDL 3 03/25/2020 0749   VLDL 21.4 03/25/2020 0749   LDLCALC 83 03/25/2020 0749    CBC    Component Value Date/Time   WBC 14.9 (H) 06/08/2021 0134   RBC 3.50 (L) 06/08/2021 0134   HGB 10.3 (L) 06/08/2021 0134   HCT 33.7 (L) 06/08/2021 0134   PLT 153 06/08/2021 0134   MCV 96.3 06/08/2021 0134   MCH 29.4 06/08/2021 0134   MCHC 30.6 06/08/2021 0134   RDW 14.5 06/08/2021 0134    Hgb A1C Lab Results  Component Value Date   HGBA1C 6.1 (A) 12/19/2020            Assessment & Plan:   Urinary Urgency:  Urinalysis today She had a bladder sling 05/2021 Avoid caffeine  Skin Tag of Abdomen:  No intervention needed at this time Will monitor  RTC in 6 months for your annual exam Webb Silversmith, NP This visit occurred during the SARS-CoV-2 public health emergency.  Safety protocols were in place, including screening questions prior to the visit, additional usage of staff PPE, and extensive cleaning of exam room while observing appropriate contact time as indicated for disinfecting solutions.

## 2021-09-27 NOTE — Assessment & Plan Note (Signed)
A1c today Encourage low-carb diet and exercise for weight loss 

## 2021-09-27 NOTE — Assessment & Plan Note (Signed)
C-Met and lipid profile today Encouraged her to consume a low-fat diet Continue Atorvastatin

## 2021-09-27 NOTE — Assessment & Plan Note (Signed)
Currently not an issue Will monitor 

## 2021-09-27 NOTE — Assessment & Plan Note (Signed)
Try to avoid foods that trigger reflux Encourage weight loss as this can help reduce reflux symptoms Continue Omeprazole

## 2021-09-27 NOTE — Assessment & Plan Note (Signed)
Encourage diet and exercise for weight loss 

## 2021-09-27 NOTE — Assessment & Plan Note (Signed)
Less thanControlled on Lisinopril HCT Reinforced DASH diet C-Met today

## 2021-09-27 NOTE — Assessment & Plan Note (Signed)
TSH and free T4 today Will adjust Levothyroxine if needed based on labs 

## 2021-09-27 NOTE — Assessment & Plan Note (Signed)
CBC today.  

## 2021-09-27 NOTE — Assessment & Plan Note (Signed)
Try to avoid triggers Continue Excedrin Migraine as needed

## 2021-09-28 LAB — COMPLETE METABOLIC PANEL WITH GFR
AG Ratio: 1.6 (calc) (ref 1.0–2.5)
ALT: 13 U/L (ref 6–29)
AST: 14 U/L (ref 10–35)
Albumin: 3.9 g/dL (ref 3.6–5.1)
Alkaline phosphatase (APISO): 67 U/L (ref 31–125)
BUN: 18 mg/dL (ref 7–25)
CO2: 31 mmol/L (ref 20–32)
Calcium: 9.1 mg/dL (ref 8.6–10.2)
Chloride: 104 mmol/L (ref 98–110)
Creat: 0.77 mg/dL (ref 0.50–0.99)
Globulin: 2.4 g/dL (calc) (ref 1.9–3.7)
Glucose, Bld: 95 mg/dL (ref 65–99)
Potassium: 4.3 mmol/L (ref 3.5–5.3)
Sodium: 141 mmol/L (ref 135–146)
Total Bilirubin: 0.4 mg/dL (ref 0.2–1.2)
Total Protein: 6.3 g/dL (ref 6.1–8.1)
eGFR: 95 mL/min/{1.73_m2} (ref >=60)

## 2021-09-28 LAB — HEMOGLOBIN A1C
Hgb A1c MFr Bld: 6.2 % of total Hgb — ABNORMAL HIGH (ref <5.7)
Mean Plasma Glucose: 131 mg/dL
eAG (mmol/L): 7.3 mmol/L

## 2021-09-28 LAB — LIPID PANEL
Cholesterol: 165 mg/dL (ref <200)
HDL: 50 mg/dL (ref >=50)
LDL Cholesterol (Calc): 89 mg/dL (calc)
Non-HDL Cholesterol (Calc): 115 mg/dL (calc) (ref <130)
Total CHOL/HDL Ratio: 3.3 (calc) (ref <5.0)
Triglycerides: 162 mg/dL — ABNORMAL HIGH (ref <150)

## 2021-09-28 LAB — URINE CULTURE
MICRO NUMBER:: 12524854
SPECIMEN QUALITY:: ADEQUATE

## 2021-09-28 LAB — CBC
HCT: 38.3 % (ref 35.0–45.0)
Hemoglobin: 12.2 g/dL (ref 11.7–15.5)
MCH: 28.2 pg (ref 27.0–33.0)
MCHC: 31.9 g/dL — ABNORMAL LOW (ref 32.0–36.0)
MCV: 88.7 fL (ref 80.0–100.0)
MPV: 12.2 fL (ref 7.5–12.5)
Platelets: 191 10*3/uL (ref 140–400)
RBC: 4.32 10*6/uL (ref 3.80–5.10)
RDW: 14.3 % (ref 11.0–15.0)
WBC: 6.4 10*3/uL (ref 3.8–10.8)

## 2021-09-28 LAB — TSH: TSH: 1.35 mIU/L

## 2021-09-28 LAB — T4, FREE: Free T4: 1.1 ng/dL (ref 0.8–1.8)

## 2021-09-28 MED ORDER — NITROFURANTOIN MONOHYD MACRO 100 MG PO CAPS: 100.0000 mg | ORAL_CAPSULE | Freq: Two times a day (BID) | ORAL | 0 refills | Status: DC

## 2021-09-29 DIAGNOSIS — G4733 Obstructive sleep apnea (adult) (pediatric): Secondary | ICD-10-CM | POA: Diagnosis not present

## 2021-09-29 NOTE — Telephone Encounter (Signed)
Think patient is still seen by your office.

## 2021-10-11 ENCOUNTER — Other Ambulatory Visit: Payer: Self-pay | Admitting: Family

## 2021-10-11 DIAGNOSIS — F32A Depression, unspecified: Secondary | ICD-10-CM

## 2021-10-11 DIAGNOSIS — F419 Anxiety disorder, unspecified: Secondary | ICD-10-CM

## 2021-10-21 ENCOUNTER — Other Ambulatory Visit: Payer: Self-pay | Admitting: Family

## 2021-10-21 DIAGNOSIS — F419 Anxiety disorder, unspecified: Secondary | ICD-10-CM

## 2021-11-15 ENCOUNTER — Other Ambulatory Visit: Payer: Self-pay | Admitting: Internal Medicine

## 2021-11-15 ENCOUNTER — Encounter: Payer: Self-pay | Admitting: Internal Medicine

## 2021-12-15 ENCOUNTER — Encounter: Payer: Self-pay | Admitting: Orthopedic Surgery

## 2021-12-15 ENCOUNTER — Ambulatory Visit: Payer: Federal, State, Local not specified - PPO | Admitting: Orthopedic Surgery

## 2021-12-15 ENCOUNTER — Ambulatory Visit: Payer: Self-pay

## 2021-12-15 VITALS — BP 130/86 | HR 81 | Ht 64.0 in | Wt 252.0 lb

## 2021-12-15 DIAGNOSIS — M25521 Pain in right elbow: Secondary | ICD-10-CM

## 2021-12-15 MED ORDER — MELOXICAM 7.5 MG PO TABS: 7.5000 mg | ORAL_TABLET | Freq: Every day | ORAL | 0 refills | Status: DC

## 2021-12-15 NOTE — Progress Notes (Signed)
Office Visit Note   Patient: Anne Mcguire           Date of Birth: [birth year]           MRN: [id number] Visit Date: 12/15/2021              Requested by: Jearld Fenton, NP [place],  [place] [id number] PCP: Jearld Fenton, NP   Assessment & Plan: Visit Diagnoses:  1. Pain in right elbow     Plan: Her exam seems to be most consistent with biceps tendonitis.  We discussed treatment for tendonitis including rest and anti-inflammatory medication.  I will send a prescription for meloxicam to her pharmacy.  She is not interested in trying occupational therapy at this point.  I can see her back in 6 weeks if she's still having pain.   Follow-Up Instructions: No follow-ups on file.   Orders:  Orders Placed This Encounter  Procedures   XR Elbow 2 Views Right   No orders of the defined types were placed in this encounter.     Procedures: No procedures performed   Clinical Data: No additional findings.   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  She says the most of his pain is around the [facility] fossa.  She does have occasional pain at the lateral epicondyle.  Her pain is worse with certain activities that involve elbow flexion or supinating her wrist with the elbow flexed.  The example she gave is cooking and stirring a pot which involves wrist supination and elbow flexion.  She also is having some shoulder pain but is not sure if this is related.  This been going on for about 2 months.  She denies any injury to the elbow.  She denies any new routines, workouts, or work-related activities.  She has tried a topical cream but is otherwise not tried any treatment.  Her pain is 3 out of  10 normally.  Weakness Associated symptoms include weakness.   Review of Systems  Neurological:  Positive for weakness.    Objective: Vital Signs: BP 130/86 (BP Location: Left Arm, Patient Position: Sitting)    Pulse 81    Ht 5\' 4"  (1.626 m)    Wt 252 lb (114.3 kg)    BMI 43.26 kg/m   Physical Exam Constitutional:      Appearance: Normal appearance.  Cardiovascular:     Rate and Rhythm: Normal rate.     Pulses: Normal pulses.  Pulmonary:     Effort: Pulmonary effort is normal.  Skin:    General: Skin is warm and dry.     Capillary Refill: Capillary refill takes less than 2 seconds.  Neurological:     Mental Status: She is alert.    Right Elbow Exam   Tenderness  Right elbow tenderness location: TTP at biceps tendon.  Mild tenderness at lateral epicondyle.   Range of Motion  The patient has normal right elbow ROM.  Muscle Strength  The patient has normal right elbow strength.  Other  Erythema: absent Sensation: normal Pulse: present  Comments:  Reproduction of her pain w/ resisted elbow flexion and resisted forearm supination with elbow  flexed.  No pain w/ resisted WE or middle finger extension with elbow flexed.  Biceps tendon is easily palpable with hook test.       Specialty Comments:  No specialty comments available.  Imaging: 2 views of the right elbow taken today reviewed interpreted by me.  They demonstrate no acute bony injury.  There is no evidence of joint space narrowing or other degenerative changes at the ulnohumeral or radiocapitellar joints.   PMFS History: Patient Active Problem List   Diagnosis Date Noted   Iron deficiency anemia due to chronic blood loss 09/27/2021   Prediabetes 09/27/2021   Morbid obesity ([place]) 09/27/2021   Severe sleep apnea 01/18/2021   HLD (hyperlipidemia) 08/26/2017   Migraines 08/26/2017   Essential hypertension 01/28/2017   Insomnia 04/02/2014   Anxiety and depression 02/19/2014   Hypothyroidism 02/19/2014    GERD (gastroesophageal reflux disease) 02/19/2014   Past Medical History:  Diagnosis Date   Anxiety 06/01/2021   COVID 12/2020   headache, fever, stuffy nose, loss of taste and smell x few weeks all sx resolved except taste is altered   Depression 06/01/2021   Gastric ulcer    yrs ago no current problems per pt on 06-01-2021   GERD (gastroesophageal reflux disease) 06/01/2021   Hyperlipidemia 06/01/2021   Hypertension 06/01/2021   Hypothyroidism 06/01/2021   Menorrhagia 06/01/2021   Migraines 06/01/2021   regulae headaches also @ times   Nocturia 06/01/2021   Plantar fasciitis 06/01/2021   both feet right worse than left   Sleep apnea 06/01/2021   cpap severe osa does not know cpap settings   SUI (stress urinary incontinence, female) 06/01/2021   Wears glasses 06/01/2021    Family History  Problem Relation Age of Onset   Mental illness Mother    Arthritis Father    Hyperlipidemia Father    Hypertension Father    Diabetes Father    Arthritis Maternal Grandmother    Diabetes Maternal Grandmother    Arthritis Maternal Grandfather    Diabetes Maternal Grandfather    Arthritis Paternal Grandmother    Breast cancer Paternal Grandmother     Past Surgical History:  Procedure Laterality Date   BLADDER SUSPENSION N/A 06/07/2021   Procedure: TRANSVAGINAL TAPE (TVT) PROCEDURE;  Surgeon: [name], DO;  Location: [facility];  Service: Gynecology;  Laterality: N/A;   CATARACT EXTRACTION, BILATERAL  07/11/2017   DILITATION & CURRETTAGE/HYSTROSCOPY WITH NOVASURE ABLATION N/A 01/10/2017   Procedure: DILATATION & CURETTAGE/HYSTEROSCOPY WITH NOVASURE ABLATION;  Surgeon: [name], DO;  Location: [place] [facility];  Service: Gynecology;  Laterality: N/A;   LAPAROSCOPIC TUBAL LIGATION Bilateral 01/10/2017   Procedure: LAPAROSCOPIC TUBAL LIGATION with Filshie Clips;  Surgeon: [name], DO;  Location: [place];  Service: Gynecology;  Laterality:  Bilateral;   LAPAROSCOPIC VAGINAL HYSTERECTOMY WITH SALPINGECTOMY Bilateral 06/07/2021   Procedure: LAPAROSCOPIC ASSISTED VAGINAL HYSTERECTOMY WITH BILATERAL SALPINGECTOMY;  Surgeon: [name], DO;  Location: [place];  Service: Gynecology;  Laterality: Bilateral;   WISDOM TOOTH EXTRACTION  2013   Social History   Occupational History   Not on file  Tobacco Use   Smoking status: Never   Smokeless tobacco: Never  Vaping Use   Vaping Use: Never used  Substance and Sexual Activity   Alcohol use: Yes    Comment: occasional   Drug use: No   Sexual activity: Yes

## 2021-12-22 ENCOUNTER — Other Ambulatory Visit: Payer: Self-pay | Admitting: Internal Medicine

## 2021-12-22 NOTE — Telephone Encounter (Signed)
Requested Prescriptions  Pending Prescriptions Disp Refills   levothyroxine (SYNTHROID) 100 MCG tablet [Pharmacy Med Name: LEVOTHYROXINE SODIUM 100 MCG TAB] 90 tablet 2    Sig: TAKE 1 TABLET BY MOUTH EVERY MORNING WITH BREAKFAST     Endocrinology:  Hypothyroid Agents Failed - 12/22/2021  4:45 PM      Failed - TSH needs to be rechecked within 3 months after an abnormal result. Refill until TSH is due.      Passed - TSH in normal range and within 360 days    TSH  Date Value Ref Range Status  09/27/2021 1.35 mIU/L Final    Comment:              Reference Range .           > or = 20 Years  0.40-4.50 .                Pregnancy Ranges           First trimester    0.26-2.66           Second trimester   0.55-2.73           Third trimester    0.43-2.91          Passed - Valid encounter within last 12 months    Recent Outpatient Visits          2 months ago Prediabetes   [facility] [place], Coralie Keens, NP      Future Appointments            In 1 month Sherilyn Cooter, MD [facility]   In 3 months [place], Coralie Keens, NP [facility], [facility]

## 2022-01-17 ENCOUNTER — Other Ambulatory Visit: Payer: Self-pay | Admitting: Internal Medicine

## 2022-01-17 ENCOUNTER — Other Ambulatory Visit: Payer: Self-pay | Admitting: Family

## 2022-01-17 DIAGNOSIS — F419 Anxiety disorder, unspecified: Secondary | ICD-10-CM

## 2022-01-18 NOTE — Telephone Encounter (Signed)
Requested medication (s) are due for refill today - yes  Requested medication (s) are on the active medication list -yes  Future visit scheduled -yes  Last refill: 10/22/21 #90  Notes to clinic: Request RF: outside provider  Requested Prescriptions  Pending Prescriptions Disp Refills   buPROPion (WELLBUTRIN SR) 150 MG 12 hr tablet [Pharmacy Med Name: BUPROPION HCL ER (SR) 150 MG TAB] 90 tablet 0    Sig: TAKE 1 TABLET BY MOUTH ONCE DAILY     Psychiatry: Antidepressants - bupropion Passed - 01/17/2022  3:04 PM      Passed - Cr in normal range and within 360 days    Creat  Date Value Ref Range Status  09/27/2021 0.77 0.50 - 0.99 mg/dL Final          Passed - AST in normal range and within 360 days    AST  Date Value Ref Range Status  09/27/2021 14 10 - 35 U/L Final          Passed - ALT in normal range and within 360 days    ALT  Date Value Ref Range Status  09/27/2021 13 6 - 29 U/L Final          Passed - Completed PHQ-2 or PHQ-9 in the last 360 days      Passed - Last BP in normal range    BP Readings from Last 1 Encounters:  12/15/21 130/86          Passed - Valid encounter within last 6 months    Recent Outpatient Visits           3 months ago Prediabetes   [facility] [place], Coralie Keens, NP       Future Appointments             In 2 weeks Sherilyn Cooter, MD [facility]   In 2 months [place], Coralie Keens, NP [facility]               Requested Prescriptions  Pending Prescriptions Disp Refills   buPROPion (WELLBUTRIN SR) 150 MG 12 hr tablet [Pharmacy Med Name: BUPROPION HCL ER (SR) 150 MG TAB] 90 tablet 0    Sig: TAKE 1 TABLET BY MOUTH ONCE DAILY     Psychiatry: Antidepressants - bupropion Passed - 01/17/2022  3:04 PM      Passed - Cr in normal range and within 360 days    Creat  Date Value Ref Range Status  09/27/2021 0.77 0.50 - 0.99 mg/dL Final          Passed - AST in normal range and  within 360 days    AST  Date Value Ref Range Status  09/27/2021 14 10 - 35 U/L Final          Passed - ALT in normal range and within 360 days    ALT  Date Value Ref Range Status  09/27/2021 13 6 - 29 U/L Final          Passed - Completed PHQ-2 or PHQ-9 in the last 360 days      Passed - Last BP in normal range    BP Readings from Last 1 Encounters:  12/15/21 130/86          Passed - Valid encounter within last 6 months    Recent Outpatient Visits           3 months ago Prediabetes   [place], NP       Future Appointments             In 2 weeks Sherilyn Cooter, MD [facility]   In 2 months [place], Coralie Keens, NP [facility]

## 2022-02-01 ENCOUNTER — Ambulatory Visit: Payer: Federal, State, Local not specified - PPO | Admitting: Orthopedic Surgery

## 2022-02-09 DIAGNOSIS — N390 Urinary tract infection, site not specified: Secondary | ICD-10-CM | POA: Diagnosis not present

## 2022-02-09 DIAGNOSIS — R319 Hematuria, unspecified: Secondary | ICD-10-CM | POA: Diagnosis not present

## 2022-02-09 DIAGNOSIS — R102 Pelvic and perineal pain: Secondary | ICD-10-CM | POA: Diagnosis not present

## 2022-02-15 ENCOUNTER — Other Ambulatory Visit: Payer: Self-pay | Admitting: Internal Medicine

## 2022-02-15 NOTE — Telephone Encounter (Signed)
Requested Prescriptions  ?Pending Prescriptions Disp Refills  ?? hydrOXYzine (ATARAX) 10 MG tablet [Pharmacy Med Name: HYDROXYZINE HCL 10 MG TAB] 30 tablet 0  ?  Sig: TAKE 1 TABLET BY MOUTH ONCE DAILY AS NEEDED  ?  ? Ear, Nose, and Throat:  Antihistamines 2 Passed - 02/15/2022 12:48 PM  ?  ?  Passed - Cr in normal range and within 360 days  ?  Creat  ?Date Value Ref Range Status  ?09/27/2021 0.77 0.50 - 0.99 mg/dL Final  ?   ?  ?  Passed - Valid encounter within last 12 months  ?  Recent Outpatient Visits   ?      ? 4 months ago Prediabetes  ? [facility] [place], Coralie Keens, NP  ?  ?  ?Future Appointments   ?        ? In 1 month [place], Coralie Keens, NP [facility] [place]  ?  ? ?  ?  ?  ? ?

## 2022-02-19 ENCOUNTER — Encounter: Payer: Self-pay | Admitting: Internal Medicine

## 2022-02-27 DIAGNOSIS — G4733 Obstructive sleep apnea (adult) (pediatric): Secondary | ICD-10-CM | POA: Diagnosis not present

## 2022-03-13 ENCOUNTER — Other Ambulatory Visit: Payer: Self-pay

## 2022-03-13 DIAGNOSIS — I10 Essential (primary) hypertension: Secondary | ICD-10-CM | POA: Insufficient documentation

## 2022-03-13 DIAGNOSIS — K8064 Calculus of gallbladder and bile duct with chronic cholecystitis without obstruction: Principal | ICD-10-CM | POA: Insufficient documentation

## 2022-03-13 DIAGNOSIS — Z8616 Personal history of COVID-19: Secondary | ICD-10-CM | POA: Diagnosis not present

## 2022-03-13 DIAGNOSIS — K862 Cyst of pancreas: Secondary | ICD-10-CM | POA: Diagnosis not present

## 2022-03-13 DIAGNOSIS — E039 Hypothyroidism, unspecified: Secondary | ICD-10-CM | POA: Diagnosis not present

## 2022-03-13 DIAGNOSIS — Z79899 Other long term (current) drug therapy: Secondary | ICD-10-CM | POA: Insufficient documentation

## 2022-03-13 DIAGNOSIS — K8020 Calculus of gallbladder without cholecystitis without obstruction: Secondary | ICD-10-CM | POA: Diagnosis not present

## 2022-03-13 DIAGNOSIS — Z20822 Contact with and (suspected) exposure to covid-19: Secondary | ICD-10-CM | POA: Diagnosis not present

## 2022-03-13 DIAGNOSIS — K838 Other specified diseases of biliary tract: Secondary | ICD-10-CM | POA: Diagnosis not present

## 2022-03-13 DIAGNOSIS — R1010 Upper abdominal pain, unspecified: Secondary | ICD-10-CM | POA: Diagnosis not present

## 2022-03-13 DIAGNOSIS — K828 Other specified diseases of gallbladder: Secondary | ICD-10-CM | POA: Diagnosis not present

## 2022-03-13 NOTE — ED Triage Notes (Signed)
Patient ambulatory to triage with steady gait, without difficulty or distress noted; pt reports mid epigastric pain radiating around into lower back since yesterday accomp by nausea; denies hx of same ?

## 2022-03-14 ENCOUNTER — Emergency Department: Payer: Federal, State, Local not specified - PPO

## 2022-03-14 ENCOUNTER — Observation Stay
Admission: EM | Admit: 2022-03-14 | Discharge: 2022-03-16 | Disposition: A | Discharge status: Home / Self Care | Payer: Federal, State, Local not specified - PPO | Attending: Internal Medicine | Admitting: Internal Medicine

## 2022-03-14 ENCOUNTER — Encounter: Payer: Self-pay | Admitting: Emergency Medicine

## 2022-03-14 DIAGNOSIS — R109 Unspecified abdominal pain: Secondary | ICD-10-CM | POA: Diagnosis present

## 2022-03-14 DIAGNOSIS — K838 Other specified diseases of biliary tract: Secondary | ICD-10-CM | POA: Diagnosis not present

## 2022-03-14 DIAGNOSIS — K8020 Calculus of gallbladder without cholecystitis without obstruction: Principal | ICD-10-CM

## 2022-03-14 DIAGNOSIS — K8010 Calculus of gallbladder with chronic cholecystitis without obstruction: Secondary | ICD-10-CM

## 2022-03-14 DIAGNOSIS — F32A Depression, unspecified: Secondary | ICD-10-CM | POA: Diagnosis present

## 2022-03-14 DIAGNOSIS — E039 Hypothyroidism, unspecified: Secondary | ICD-10-CM | POA: Diagnosis present

## 2022-03-14 DIAGNOSIS — K828 Other specified diseases of gallbladder: Secondary | ICD-10-CM | POA: Diagnosis not present

## 2022-03-14 DIAGNOSIS — K862 Cyst of pancreas: Secondary | ICD-10-CM | POA: Diagnosis not present

## 2022-03-14 DIAGNOSIS — K8050 Calculus of bile duct without cholangitis or cholecystitis without obstruction: Secondary | ICD-10-CM | POA: Diagnosis not present

## 2022-03-14 DIAGNOSIS — R1010 Upper abdominal pain, unspecified: Secondary | ICD-10-CM

## 2022-03-14 DIAGNOSIS — E6609 Other obesity due to excess calories: Secondary | ICD-10-CM | POA: Diagnosis present

## 2022-03-14 DIAGNOSIS — N390 Urinary tract infection, site not specified: Secondary | ICD-10-CM | POA: Diagnosis present

## 2022-03-14 DIAGNOSIS — K219 Gastro-esophageal reflux disease without esophagitis: Secondary | ICD-10-CM | POA: Diagnosis present

## 2022-03-14 DIAGNOSIS — I10 Essential (primary) hypertension: Secondary | ICD-10-CM | POA: Diagnosis present

## 2022-03-14 DIAGNOSIS — F419 Anxiety disorder, unspecified: Secondary | ICD-10-CM | POA: Diagnosis present

## 2022-03-14 DIAGNOSIS — G4730 Sleep apnea, unspecified: Secondary | ICD-10-CM | POA: Diagnosis present

## 2022-03-14 LAB — CBC WITH DIFFERENTIAL/PLATELET
Abs Immature Granulocytes: 0.03 10*3/uL (ref 0.00–0.07)
Basophils Absolute: 0 10*3/uL (ref 0.0–0.1)
Basophils Relative: 0 %
Eosinophils Absolute: 0.1 10*3/uL (ref 0.0–0.5)
Eosinophils Relative: 1 %
HCT: 42 % (ref 36.0–46.0)
Hemoglobin: 13.3 g/dL (ref 12.0–15.0)
Immature Granulocytes: 0 %
Lymphocytes Relative: 14 %
Lymphs Abs: 1.5 10*3/uL (ref 0.7–4.0)
MCH: 28.9 pg (ref 26.0–34.0)
MCHC: 31.7 g/dL (ref 30.0–36.0)
MCV: 91.1 fL (ref 80.0–100.0)
Monocytes Absolute: 0.7 10*3/uL (ref 0.1–1.0)
Monocytes Relative: 6 %
Neutro Abs: 8.6 10*3/uL — ABNORMAL HIGH (ref 1.7–7.7)
Neutrophils Relative %: 79 %
Platelets: 184 10*3/uL (ref 150–400)
RBC: 4.61 MIL/uL (ref 3.87–5.11)
RDW: 14.4 % (ref 11.5–15.5)
WBC: 11 10*3/uL — ABNORMAL HIGH (ref 4.0–10.5)
nRBC: 0 % (ref 0.0–0.2)

## 2022-03-14 LAB — COMPREHENSIVE METABOLIC PANEL
ALT: 15 U/L (ref 0–44)
ALT: 16 U/L (ref 0–44)
AST: 17 U/L (ref 15–41)
AST: 18 U/L (ref 15–41)
Albumin: 3.6 g/dL (ref 3.5–5.0)
Albumin: 4 g/dL (ref 3.5–5.0)
Alkaline Phosphatase: 58 U/L (ref 38–126)
Alkaline Phosphatase: 63 U/L (ref 38–126)
Anion gap: 7 (ref 5–15)
Anion gap: 8 (ref 5–15)
BUN: 15 mg/dL (ref 6–20)
BUN: 18 mg/dL (ref 6–20)
CO2: 25 mmol/L (ref 22–32)
CO2: 27 mmol/L (ref 22–32)
Calcium: 8.3 mg/dL — ABNORMAL LOW (ref 8.9–10.3)
Calcium: 9 mg/dL (ref 8.9–10.3)
Chloride: 103 mmol/L (ref 98–111)
Chloride: 104 mmol/L (ref 98–111)
Creatinine, Ser: 0.7 mg/dL (ref 0.44–1.00)
Creatinine, Ser: 0.87 mg/dL (ref 0.44–1.00)
GFR, Estimated: >60 mL/min (ref >60)
GFR, Estimated: >60 mL/min (ref >60)
Glucose, Bld: 109 mg/dL — ABNORMAL HIGH (ref 70–99)
Glucose, Bld: 122 mg/dL — ABNORMAL HIGH (ref 70–99)
Potassium: 3.4 mmol/L — ABNORMAL LOW (ref 3.5–5.1)
Potassium: 3.8 mmol/L (ref 3.5–5.1)
Sodium: 136 mmol/L (ref 135–145)
Sodium: 138 mmol/L (ref 135–145)
Total Bilirubin: 0.5 mg/dL (ref 0.3–1.2)
Total Bilirubin: 0.7 mg/dL (ref 0.3–1.2)
Total Protein: 6.3 g/dL — ABNORMAL LOW (ref 6.5–8.1)
Total Protein: 7.5 g/dL (ref 6.5–8.1)

## 2022-03-14 LAB — URINALYSIS, ROUTINE W REFLEX MICROSCOPIC
Bilirubin Urine: NEGATIVE
Glucose, UA: NEGATIVE mg/dL
Hgb urine dipstick: NEGATIVE
Ketones, ur: NEGATIVE mg/dL
Nitrite: NEGATIVE
Protein, ur: 30 mg/dL — AB
Specific Gravity, Urine: 1.032 — ABNORMAL HIGH (ref 1.005–1.030)
pH: 5 (ref 5.0–8.0)

## 2022-03-14 LAB — LIPASE, BLOOD: Lipase: 30 U/L (ref 11–51)

## 2022-03-14 LAB — HIV ANTIBODY (ROUTINE TESTING W REFLEX): HIV Screen 4th Generation wRfx: NONREACTIVE

## 2022-03-14 LAB — TROPONIN I (HIGH SENSITIVITY)
Troponin I (High Sensitivity): 3 ng/L (ref <18)
Troponin I (High Sensitivity): 3 ng/L (ref <18)

## 2022-03-14 LAB — RESP PANEL BY RT-PCR (FLU A&B, COVID) ARPGX2
Influenza A by PCR: NEGATIVE
Influenza B by PCR: NEGATIVE
SARS Coronavirus 2 by RT PCR: NEGATIVE

## 2022-03-14 MED ORDER — MORPHINE SULFATE (PF) 4 MG/ML IV SOLN
4.0000 mg | Freq: Once | INTRAVENOUS | Status: DC
Start: 2022-03-14 — End: 2022-03-16

## 2022-03-14 MED ORDER — PIPERACILLIN-TAZOBACTAM 3.375 G IVPB
3.3750 g | Freq: Three times a day (TID) | INTRAVENOUS | Status: DC
  Administered 2022-03-14 – 2022-03-16 (×6): 3.375 g via INTRAVENOUS
  Filled 2022-03-14 (×5): qty 50

## 2022-03-14 MED ORDER — SODIUM CHLORIDE 0.9 % IV BOLUS (SEPSIS)
1000.0000 mL | Freq: Once | INTRAVENOUS | Status: AC
  Administered 2022-03-14: 1000 mL via INTRAVENOUS

## 2022-03-14 MED ORDER — METOCLOPRAMIDE HCL 5 MG/ML IJ SOLN
10.0000 mg | Freq: Once | INTRAMUSCULAR | Status: AC
  Administered 2022-03-14: 10 mg via INTRAVENOUS
  Filled 2022-03-14: qty 2

## 2022-03-14 MED ORDER — ONDANSETRON HCL 4 MG/2ML IJ SOLN
4.0000 mg | Freq: Once | INTRAMUSCULAR | Status: AC
  Administered 2022-03-14: 4 mg via INTRAVENOUS
  Filled 2022-03-14: qty 2

## 2022-03-14 MED ORDER — HYDROMORPHONE HCL 1 MG/ML IJ SOLN
1.0000 mg | Freq: Once | INTRAMUSCULAR | Status: AC
  Administered 2022-03-14: 1 mg via INTRAVENOUS
  Filled 2022-03-14: qty 1

## 2022-03-14 MED ORDER — ONDANSETRON HCL 4 MG/2ML IJ SOLN
4.0000 mg | Freq: Four times a day (QID) | INTRAMUSCULAR | Status: DC | PRN
Start: 2022-03-14 — End: 2022-03-16
  Administered 2022-03-14 (×2): 4 mg via INTRAVENOUS
  Filled 2022-03-14 (×2): qty 2

## 2022-03-14 MED ORDER — HYDROCHLOROTHIAZIDE 12.5 MG PO TABS
12.5000 mg | ORAL_TABLET | Freq: Every day | ORAL | Status: DC
  Administered 2022-03-14 – 2022-03-16 (×2): 12.5 mg via ORAL
  Filled 2022-03-14 (×2): qty 1

## 2022-03-14 MED ORDER — LISINOPRIL 20 MG PO TABS
20.0000 mg | ORAL_TABLET | Freq: Every day | ORAL | Status: DC
  Administered 2022-03-14 – 2022-03-16 (×2): 20 mg via ORAL
  Filled 2022-03-14: qty 1
  Filled 2022-03-14: qty 2

## 2022-03-14 MED ORDER — GADOBUTROL 1 MMOL/ML IV SOLN
10.0000 mL | Freq: Once | INTRAVENOUS | Status: AC | PRN
  Administered 2022-03-14: 10 mL via INTRAVENOUS

## 2022-03-14 MED ORDER — ONDANSETRON HCL 4 MG PO TABS: 4.0000 mg | ORAL_TABLET | Freq: Four times a day (QID) | ORAL | Status: DC | PRN

## 2022-03-14 MED ORDER — LEVOTHYROXINE SODIUM 100 MCG PO TABS
100.0000 ug | ORAL_TABLET | Freq: Every day | ORAL | Status: DC
  Administered 2022-03-16: 100 ug via ORAL
  Filled 2022-03-14: qty 1

## 2022-03-14 MED ORDER — LISINOPRIL-HYDROCHLOROTHIAZIDE 20-12.5 MG PO TABS
1.0000 | ORAL_TABLET | Freq: Every day | ORAL | Status: DC
  Administered 2022-03-14: 1 via ORAL

## 2022-03-14 MED ORDER — HYDROXYZINE HCL 10 MG PO TABS
10.0000 mg | ORAL_TABLET | Freq: Every day | ORAL | Status: DC | PRN
  Administered 2022-03-15: 10 mg via ORAL
  Filled 2022-03-14 (×3): qty 1

## 2022-03-14 MED ORDER — PANTOPRAZOLE SODIUM 40 MG IV SOLR
40.0000 mg | INTRAVENOUS | Status: DC
Start: 2022-03-14 — End: 2022-03-16
  Administered 2022-03-14 – 2022-03-16 (×3): 40 mg via INTRAVENOUS
  Filled 2022-03-14 (×3): qty 10

## 2022-03-14 MED ORDER — SODIUM CHLORIDE 0.9 % IV SOLN: INTRAVENOUS | Status: DC

## 2022-03-14 MED ORDER — BUPROPION HCL ER (SR) 150 MG PO TB12
150.0000 mg | ORAL_TABLET | Freq: Every day | ORAL | Status: DC
  Administered 2022-03-14 – 2022-03-16 (×2): 150 mg via ORAL
  Filled 2022-03-14 (×3): qty 1

## 2022-03-14 MED ORDER — MORPHINE SULFATE (PF) 4 MG/ML IV SOLN
4.0000 mg | Freq: Once | INTRAVENOUS | Status: AC
  Administered 2022-03-14: 4 mg via INTRAVENOUS
  Filled 2022-03-14: qty 1

## 2022-03-14 MED ORDER — FLUOXETINE HCL 20 MG PO CAPS
40.0000 mg | ORAL_CAPSULE | Freq: Every day | ORAL | Status: DC
  Administered 2022-03-14 – 2022-03-16 (×2): 40 mg via ORAL
  Filled 2022-03-14 (×2): qty 2

## 2022-03-14 MED ORDER — SODIUM CHLORIDE 0.9 % IV SOLN: 1.0000 g | INTRAVENOUS | Status: DC

## 2022-03-14 MED ORDER — HYDROMORPHONE HCL 1 MG/ML IJ SOLN
1.0000 mg | INTRAMUSCULAR | Status: DC | PRN
  Administered 2022-03-14 – 2022-03-15 (×3): 1 mg via INTRAVENOUS
  Filled 2022-03-14 (×3): qty 1

## 2022-03-14 MED ORDER — PIPERACILLIN-TAZOBACTAM 3.375 G IVPB 30 MIN
3.3750 g | Freq: Once | INTRAVENOUS | Status: AC
  Administered 2022-03-14: 3.375 g via INTRAVENOUS
  Filled 2022-03-14: qty 50

## 2022-03-14 NOTE — Progress Notes (Signed)
Pt was having family member bring home CPAP unit in, family member did not bring all parts. Pt does not want to use one provided by the hospital at this time. Pt aware that one can be made available to her if she changes her mind. ?

## 2022-03-14 NOTE — Assessment & Plan Note (Signed)
Related to morbid obesity ?Continue CPAP at bedtime ?

## 2022-03-14 NOTE — Assessment & Plan Note (Signed)
Stable Continue Synthroid 

## 2022-03-14 NOTE — H&P (Signed)
?History and Physical  ? ? ?Patient: Anne Mcguire AQT:[id number] DOB: [birth year] ?DOA: 03/14/2022 ?DOS: the patient was seen and examined on 03/14/2022 ?PCP: Jearld Fenton, NP  ?Patient coming from: Home ? ?Chief Complaint:  ?Chief Complaint  ?Patient presents with  ? Abdominal Pain  ? ?HPI: Anne Mcguire is a 51 y.o. female with medical history significant for morbid obesity, depression, anxiety, hypothyroidism, hypertension who presents to the ER for evaluation of epigastric pain/right upper quadrant which she has had intermittently for 2 days but worse over the last 24 hours. ?Patient states she had symptoms 2 days ago that improved but over the last 24 hours she has had persistent epigastric/right upper quadrant pain which started after lunch yesterday associated with nausea and vomiting but denies having any diarrhea.  She rates her pain a 7 x 10 in intensity at its worst and it is nonradiating.  Denies having any known aggravating or relieving factors. ?She denies having any fever or chills, no cough, no headache, no dizziness, no lightheadedness, no chest pain, no shortness of breath, no leg swelling, no blurred vision no focal deficit. ?Review of Systems: As mentioned in the history of present illness. All other systems reviewed and are negative. ?Past Medical History:  ?Diagnosis Date  ? Anxiety 06/01/2021  ? COVID 12/2020  ? headache, fever, stuffy nose, loss of taste and smell x few weeks all sx resolved except taste is altered  ? Depression 06/01/2021  ? Gastric ulcer   ? yrs ago no current problems per pt on 06-01-2021  ? GERD (gastroesophageal reflux disease) 06/01/2021  ? Hyperlipidemia 06/01/2021  ? Hypertension 06/01/2021  ? Hypothyroidism 06/01/2021  ? Menorrhagia 06/01/2021  ? Migraines 06/01/2021  ? regulae headaches also @ times  ? Nocturia 06/01/2021  ? Plantar fasciitis 06/01/2021  ? both feet right worse than left  ? Sleep apnea 06/01/2021  ? cpap severe osa does not know cpap settings   ? SUI (stress urinary incontinence, female) 06/01/2021  ? Wears glasses 06/01/2021  ? ?Past Surgical History:  ?Procedure Laterality Date  ? BLADDER SUSPENSION N/A 06/07/2021  ? Procedure: TRANSVAGINAL TAPE (TVT) PROCEDURE;  Surgeon: [name], DO;  Location: [place];  Service: Gynecology;  Laterality: N/A;  ? CATARACT EXTRACTION, BILATERAL  07/11/2017  ? DILITATION & CURRETTAGE/HYSTROSCOPY WITH NOVASURE ABLATION N/A 01/10/2017  ? Procedure: DILATATION & CURETTAGE/HYSTEROSCOPY WITH NOVASURE ABLATION;  Surgeon: [name], DO;  Location: [place];  Service: Gynecology;  Laterality: N/A;  ? LAPAROSCOPIC TUBAL LIGATION Bilateral 01/10/2017  ? Procedure: LAPAROSCOPIC TUBAL LIGATION with Filshie Clips;  Surgeon: [name], DO;  Location: [place];  Service: Gynecology;  Laterality: Bilateral;  ? LAPAROSCOPIC VAGINAL HYSTERECTOMY WITH SALPINGECTOMY Bilateral 06/07/2021  ? Procedure: LAPAROSCOPIC ASSISTED VAGINAL HYSTERECTOMY WITH BILATERAL SALPINGECTOMY;  Surgeon: [name], DO;  Location: [place];  Service: Gynecology;  Laterality: Bilateral;  ? WISDOM TOOTH EXTRACTION  2013  ? ?Social History:  reports that she has never smoked. She has never used smokeless tobacco. She reports current alcohol use. She reports that she does not use drugs. ? ?Allergies  ?Allergen Reactions  ? Azithromycin Hives  ? ? ?Family History  ?Problem Relation Age of Onset  ? Mental illness Mother   ? Arthritis Father   ? Hyperlipidemia Father   ? Hypertension Father   ? Diabetes Father   ? Arthritis Maternal Grandmother   ? Diabetes Maternal Grandmother   ? Arthritis Maternal Grandfather   ?  Diabetes Maternal Grandfather   ? Arthritis Paternal Grandmother   ? Breast cancer Paternal Grandmother   ? ? ?Prior to Admission medications   ?Medication Sig Start Date End Date Taking? Authorizing Provider  ?atorvastatin (LIPITOR) 10 MG tablet TAKE 1 TABLET BY MOUTH ONCE  DAILY 11/15/21   Jearld Fenton, NP  ?buPROPion [facility] SR) 150 MG 12 hr tablet TAKE 1 TABLET BY MOUTH ONCE DAILY 01/18/22   Jearld Fenton, NP  ?FLUoxetine (PROZAC) 40 MG capsule Take 1 capsule (40 mg total) by mouth daily. 09/27/21   Jearld Fenton, NP  ?hydrOXYzine (ATARAX) 10 MG tablet TAKE 1 TABLET BY MOUTH ONCE DAILY AS NEEDED 02/15/22   Jearld Fenton, NP  ?levothyroxine (SYNTHROID) 100 MCG tablet TAKE 1 TABLET BY MOUTH EVERY MORNING WITH BREAKFAST 12/22/21   Jearld Fenton, NP  ?lisinopril-hydrochlorothiazide (ZESTORETIC) 20-12.5 MG tablet TAKE 1 TABLET BY MOUTH ONCE A DAY 09/29/21   Jearld Fenton, NP  ?meloxicam (MOBIC) 7.5 MG tablet Take 1 tablet (7.5 mg total) by mouth daily. 12/15/21   Sherilyn Cooter, MD  ?nitrofurantoin, macrocrystal-monohydrate, (MACROBID) 100 MG capsule Take 1 capsule (100 mg total) by mouth 2 (two) times daily. 09/28/21   Jearld Fenton, NP  ?omeprazole (PRILOSEC) 20 MG capsule TAKE 1 CAPSULE BY MOUTH ONCE DAILY 08/28/21   Kennyth Arnold, FNP  ? ? ?Physical Exam: ?Vitals:  ? 03/14/22 0712 03/14/22 0730 03/14/22 0830 03/14/22 0930  ?BP: 130/73 135/78 (!) 163/76 (!) 155/75  ?Pulse: 78 69 63 63  ?Resp: '13 12 15 17  '$ ?Temp:      ?TempSrc:      ?SpO2: 100% 92% 98% 99%  ?Weight:      ?Height:      ? ?Physical Exam ?Vitals and nursing note reviewed.  ?Constitutional:   ?   Appearance: She is well-developed. She is obese.  ?HENT:  ?   Head: Normocephalic and atraumatic.  ?   Mouth/Throat:  ?   Mouth: Mucous membranes are moist.  ?Eyes:  ?   Extraocular Movements: Extraocular movements intact.  ?Cardiovascular:  ?   Rate and Rhythm: Normal rate and regular rhythm.  ?Pulmonary:  ?   Effort: Pulmonary effort is normal.  ?   Breath sounds: Normal breath sounds.  ?Abdominal:  ?   General: Bowel sounds are normal.  ?   Palpations: Abdomen is soft.  ?   Tenderness: There is abdominal tenderness in the right upper quadrant and epigastric area.  ?   Comments: Central adiposity   ?Genitourinary: ?   Uterus: Absent.   ?Skin: ?   General: Skin is warm and dry.  ?Neurological:  ?   General: No focal deficit present.  ?   Mental Status: She is alert.  ?Psychiatric:     ?   Mood and Affect: Mood normal.     ?   Behavior: Behavior normal.  ? ? ?Data Reviewed: ?Relevant notes from primary care and specialist visits, past discharge summaries as available in EHR, including Care Everywhere. ?Prior diagnostic testing as pertinent to current admission diagnoses ?Updated medications and problem lists for reconciliation ?ED course, including vitals, labs, imaging, treatment and response to treatment ?Triage notes, nursing and pharmacy notes and ED provider's notes ?Notable results as noted in HPI ?Labs reviewed.  White count 11.0, hemoglobin 13.3, hematocrit 42, troponin 3, lipase 30, serum glucose 122 ?Urine analysis shows pyuria ?Gallbladder ultrasound shows Cholelithiasis without evidence of acute cholecystitis at this ?time. Very mild dilatation  of the common bile duct (7.2 mm). The possibility of choledocholithiasis is not excluded. If there is clinical concern for biliary tract obstruction from choledocholithiasis or obstructing pancreatic head mass, further evaluation with abdominal MRI with and without IV gadolinium with ?MRCP would be strongly recommended at this time. Hepatic steatosis. ?MRCP shows findings concerning for (but not definitive for) potential choledocholithiasis and/or biliary sludge in the distal common bile duct. At this time, this is associated with minimal common bile duct dilatation, but no frank intrahepatic biliary ductal dilatation.  Cholelithiasis without evidence of acute cholecystitis. ?Mild hepatic steatosis. Cystic lesion in the neck of the pancreas without definite direct ?communication with the main pancreatic duct measuring 2.2 x 1.3 x 1.5 cm. This is favored to represent a benign lesions such as a small pancreatic pseudocyst or side branch IPMN  (intraductal ?papillary mucinous neoplasm). However, repeat abdominal MRI with and without IV gadolinium with MRCP is recommended in 6 months to ensure the stability of this finding. ?Lead EKG reviewed by me shows normal sinus rhythm. ?The

## 2022-03-14 NOTE — Assessment & Plan Note (Signed)
Patient has a BMI of 39.48 kg/m2 ?Complicates overall prognosis and care ?Lifestyle modification and exercise has been discussed in detail ?

## 2022-03-14 NOTE — ED Notes (Signed)
Dr. Kerman Passey MD at bedside at this time for reevaluation.  ?

## 2022-03-14 NOTE — Assessment & Plan Note (Signed)
Stable ?Continue IV PPI ?

## 2022-03-14 NOTE — Consult Note (Signed)
Pharmacy Antibiotic Note ? ?Anne Mcguire is a 51 y.o. female w/ h/o  morbid obesity (BMI 39.5), MDD/anxiety, hypothyroidism, HTN presenting to the ER for evaluation of RUQ epigastric pain intermittent x2 days admitted on 03/14/2022 with c/f  intra-abdominal infection .  Pharmacy has been consulted for Zosyn dosing. ? ?4/05 MRCP showing common bile duct dilation, c/f choledocholithiasis w/ biliary sludge but w/o obstructing stones. No evidence of acute cholecystitis. Mild hepatic steatosis. Small cystic lesions on pancreas (will need 9-monthinterval followup imaging). ? ? ?Plan: Planning ERCP on 4/06. ?Zosyn 3.375g x1 in ED; followed by Zosyn 3.375g q8H thereafter. ? ?Height: '5\' 4"'$  (162.6 cm) ?Weight: 104.3 kg (230 lb) ?IBW/kg (Calculated) : 54.7 ? ?Temp (24hrs), Avg:97.8 ?F (36.6 ?C), Min:97.8 ?F (36.6 ?C), Max:97.8 ?F (36.6 ?C) ? ?Recent Labs  ?Lab 03/14/22 ?0002  ?WBC 11.0*  ?CREATININE 0.87  ?  ?Estimated Creatinine Clearance: 91 mL/min (by C-G formula based on SCr of 0.87 mg/dL).   ? ?Allergies  ?Allergen Reactions  ? Azithromycin Hives  ? ? ?Antimicrobials this admission: ?Zosyn (4/05 >>  ? ?Dose adjustments this admission: ?CTM and adjust PRN. Currently at baseline renal fxn ? ?Microbiology results: ?4/05 Flu/Cov: sent ? ?Thank you for allowing pharmacy to be a part of this patient?s care. ? ?BShanon BrowBeers ?03/14/2022 11:19 AM ? ?

## 2022-03-14 NOTE — Assessment & Plan Note (Signed)
Stable ?Continue Prozac and bupropion ?

## 2022-03-14 NOTE — ED Provider Notes (Signed)
? ?[facility] ?Provider Note ? ? ? Event Date/Time  ? First MD Initiated Contact with Patient 03/14/22 0350   ?  (approximate) ? ? ?History  ? ?Abdominal Pain ? ? ?HPI ? ?Anne Mcguire is a 51 y.o. female with history of obesity, gastric ulcer, hypertension, hyperlipidemia, hypothyroidism who presents to the emergency department with complaints of upper abdominal pain worse with eating and drinking for the past day.  Has had nausea and vomiting.  No diarrhea.  No dysuria, hematuria, vaginal bleeding or discharge.  Has had previous hysterectomy with salpingectomy, BTL, bladder suspension.  She denies chest pain or shortness of breath. ? ? ?History provided by patient. ? ? ? ?Past Medical History:  ?Diagnosis Date  ? Anxiety 06/01/2021  ? COVID 12/2020  ? headache, fever, stuffy nose, loss of taste and smell x few weeks all sx resolved except taste is altered  ? Depression 06/01/2021  ? Gastric ulcer   ? yrs ago no current problems per pt on 06-01-2021  ? GERD (gastroesophageal reflux disease) 06/01/2021  ? Hyperlipidemia 06/01/2021  ? Hypertension 06/01/2021  ? Hypothyroidism 06/01/2021  ? Menorrhagia 06/01/2021  ? Migraines 06/01/2021  ? regulae headaches also @ times  ? Nocturia 06/01/2021  ? Plantar fasciitis 06/01/2021  ? both feet right worse than left  ? Sleep apnea 06/01/2021  ? cpap severe osa does not know cpap settings  ? SUI (stress urinary incontinence, female) 06/01/2021  ? Wears glasses 06/01/2021  ? ? ?Past Surgical History:  ?Procedure Laterality Date  ? BLADDER SUSPENSION N/A 06/07/2021  ? Procedure: TRANSVAGINAL TAPE (TVT) PROCEDURE;  Surgeon: [name], DO;  Location: [place];  Service: Gynecology;  Laterality: N/A;  ? CATARACT EXTRACTION, BILATERAL  07/11/2017  ? DILITATION & CURRETTAGE/HYSTROSCOPY WITH NOVASURE ABLATION N/A 01/10/2017  ? Procedure: DILATATION & CURETTAGE/HYSTEROSCOPY WITH NOVASURE ABLATION;  Surgeon: [name], DO;  Location:  [place];  Service: Gynecology;  Laterality: N/A;  ? LAPAROSCOPIC TUBAL LIGATION Bilateral 01/10/2017  ? Procedure: LAPAROSCOPIC TUBAL LIGATION with Filshie Clips;  Surgeon: [name], DO;  Location: [place];  Service: Gynecology;  Laterality: Bilateral;  ? LAPAROSCOPIC VAGINAL HYSTERECTOMY WITH SALPINGECTOMY Bilateral 06/07/2021  ? Procedure: LAPAROSCOPIC ASSISTED VAGINAL HYSTERECTOMY WITH BILATERAL SALPINGECTOMY;  Surgeon: [name], DO;  Location: [place];  Service: Gynecology;  Laterality: Bilateral;  ? [place] EXTRACTION  2013  ? ? ?MEDICATIONS:  ?Prior to Admission medications   ?Medication Sig Start Date End Date Taking? Authorizing Provider  ?atorvastatin (LIPITOR) 10 MG tablet TAKE 1 TABLET BY MOUTH ONCE DAILY 11/15/21   Jearld Fenton, NP  ?buPROPion [facility] SR) 150 MG 12 hr tablet TAKE 1 TABLET BY MOUTH ONCE DAILY 01/18/22   Jearld Fenton, NP  ?FLUoxetine (PROZAC) 40 MG capsule Take 1 capsule (40 mg total) by mouth daily. 09/27/21   Jearld Fenton, NP  ?hydrOXYzine (ATARAX) 10 MG tablet TAKE 1 TABLET BY MOUTH ONCE DAILY AS NEEDED 02/15/22   Jearld Fenton, NP  ?levothyroxine (SYNTHROID) 100 MCG tablet TAKE 1 TABLET BY MOUTH EVERY MORNING WITH BREAKFAST 12/22/21   Jearld Fenton, NP  ?lisinopril-hydrochlorothiazide (ZESTORETIC) 20-12.5 MG tablet TAKE 1 TABLET BY MOUTH ONCE A DAY 09/29/21   Jearld Fenton, NP  ?meloxicam (MOBIC) 7.5 MG tablet Take 1 tablet (7.5 mg total) by mouth daily. 12/15/21   Sherilyn Cooter, MD  ?nitrofurantoin, macrocrystal-monohydrate, (MACROBID) 100 MG capsule Take 1 capsule (100 mg total) by  mouth 2 (two) times daily. 09/28/21   Jearld Fenton, NP  ?omeprazole (PRILOSEC) 20 MG capsule TAKE 1 CAPSULE BY MOUTH ONCE DAILY 08/28/21   Kennyth Arnold, FNP  ? ? ?Physical Exam  ? ?Triage Vital Signs: ?ED Triage Vitals  ?Enc Vitals Group  ?   BP 03/14/22 0000 132/75  ?   Pulse Rate 03/14/22 0000 79  ?   Resp 03/14/22  0000 18  ?   Temp 03/14/22 0000 97.8 ?F (36.6 ?C)  ?   Temp Source 03/14/22 0000 Oral  ?   SpO2 03/14/22 0000 94 %  ?   Weight 03/13/22 2359 230 lb (104.3 kg)  ?   Height 03/13/22 2359 '5\' 4"'$  (1.626 m)  ?   Head Circumference --   ?   Peak Flow --   ?   Pain Score 03/13/22 2359 9  ?   Pain Loc --   ?   Pain Edu? --   ?   Excl. in [place]? --   ? ? ?Most recent vital signs: ?Vitals:  ? 03/14/22 0400 03/14/22 0500  ?BP: (!) 158/85 (!) 149/77  ?Pulse: 63 71  ?Resp: 12 16  ?Temp:    ?SpO2: 96% 97%  ? ? ?CONSTITUTIONAL: Alert and oriented and responds appropriately to questions. Well-appearing; well-nourished ?HEAD: Normocephalic, atraumatic ?EYES: Conjunctivae clear, pupils appear equal, sclera nonicteric ?ENT: normal nose; moist mucous membranes ?NECK: Supple, normal ROM ?CARD: RRR; S1 and S2 appreciated; no murmurs, no clicks, no rubs, no gallops ?RESP: Normal chest excursion without splinting or tachypnea; breath sounds clear and equal bilaterally; no wheezes, no rhonchi, no rales, no hypoxia or respiratory distress, speaking full sentences ?ABD/GI: Normal bowel sounds; non-distended; soft, ender throughout the upper abdomen, negative Murphy sign, no tenderness at McBurney's point ?BACK: The back appears normal ?EXT: Normal ROM in all joints; no deformity noted, no edema; no cyanosis ?SKIN: Normal color for age and race; warm; no rash on exposed skin ?NEURO: Moves all extremities equally, normal speech ?PSYCH: The patient's mood and manner are appropriate. ? ? ?ED Results / Procedures / Treatments  ? ?LABS: ?(all labs ordered are listed, but only abnormal results are displayed) ?Labs Reviewed  ?CBC WITH DIFFERENTIAL/PLATELET - Abnormal; Notable for the following components:  ?    Result Value  ? WBC 11.0 (*)   ? Neutro Abs 8.6 (*)   ? All other components within normal limits  ?COMPREHENSIVE METABOLIC PANEL - Abnormal; Notable for the following components:  ? Glucose, Bld 122 (*)   ? All other components within normal  limits  ?URINALYSIS, ROUTINE W REFLEX MICROSCOPIC - Abnormal; Notable for the following components:  ? Color, Urine YELLOW (*)   ? APPearance CLOUDY (*)   ? Specific Gravity, Urine 1.032 (*)   ? Protein, ur 30 (*)   ? Leukocytes,Ua LARGE (*)   ? Bacteria, UA FEW (*)   ? All other components within normal limits  ?LIPASE, BLOOD  ?TROPONIN I (HIGH SENSITIVITY)  ?TROPONIN I (HIGH SENSITIVITY)  ? ? ? ?EKG: ? EKG Interpretation ? ?Date/Time:  Wednesday March 14 2022 00:02:43 EDT ?Ventricular Rate:  73 ?PR Interval:  148 ?QRS Duration: 82 ?QT Interval:  416 ?QTC Calculation: 458 ?R Axis:   51 ?Text Interpretation: Normal sinus rhythm Normal ECG When compared with ECG of 05-Jun-2021 08:57, No significant change was found Confirmed by Pryor Curia [phone]) on 03/14/2022 3:52:27 AM ?  ? ?  ? ? ? ?RADIOLOGY: ?My personal review and  interpretation of imaging: Cholelithiasis without cholecystitis seen on ultrasound. ? ?I have personally reviewed all radiology reports.   ?US ABDOMEN LIMITED RUQ (LIVER/GB) ? ?Result Date: 03/14/2022 ?CLINICAL DATA:  51 year old female with history of upper abdominal pain since yesterday. EXAM: ULTRASOUND ABDOMEN LIMITED RIGHT UPPER QUADRANT COMPARISON:  No priors. FINDINGS: Gallbladder: Numerous echogenic foci with posterior acoustic shadowing are noted in the gallbladder, most evident in the fundal region. The largest of these measures up to 10 mm. Gallbladder wall thickness is normal at 2.5 mm. No pericholecystic fluid. Per report from the sonographer, the patient did not exhibit a sonographic Murphy's sign on examination. Common bile duct: Diameter: 7.2 mm in the porta hepatis. Liver: No focal lesion identified. Mild diffuse increased echogenicity, indicative of a background of hepatic steatosis. Portal vein is patent on color Doppler imaging with normal direction of blood flow towards the liver. Other: None. IMPRESSION: 1. Cholelithiasis without evidence of acute cholecystitis at this time. 2.  Very mild dilatation of the common bile duct (7.2 mm). The possibility of choledocholithiasis is not excluded. If there is clinical concern for biliary tract obstruction from choledocholithiasis or obstructing pa

## 2022-03-14 NOTE — Assessment & Plan Note (Signed)
Patient noted to have pyuria ?Treat empirically with Zosyn until urine culture results become available ?

## 2022-03-14 NOTE — ED Notes (Addendum)
Pt intermittently O2 dropping into the mid-80s. States she does have a hx of sleep apnea and has been dosing off to sleep. Pt placed on 2L [place] at this time. Dr. Kerman Passey, MD notified at this time.  ?

## 2022-03-14 NOTE — ED Notes (Signed)
Dr. Francine Graven at bedside at this time.  ?

## 2022-03-14 NOTE — Assessment & Plan Note (Signed)
Patient presents for evaluation of epigastric and right upper quadrant pain. ?Imaging suggestive of possible choledocholithiasis ?Supportive care with IV antibiotics, pain control, antiemetics and IV fluids ?GI evaluation ?ERCP is planned for a.m. ?We will request surgery consult ?

## 2022-03-14 NOTE — Consult Note (Addendum)
? ? ?GI Inpatient Consult Note ? ?Reason for Consult: Choledocholithiasis  ?  ?Attending Requesting Consult: Dr. Collier Bullock, MD ? ?History of Present Illness: ?Anne Mcguire is a 51 y.o. female seen for evaluation of choledocholithiasis at the request of hospitalist - Dr. Royce Macadamia Agbata. Pt has a PMH of HTN, morbid obesity, anxiety, depression, and hypothyroidism. She presented to the [facility] ED yesterday evening for chief complaint of acute RUQ/epigastric abdominal pain with associated nausea and non-bloody and non-bilious emesis. Pain initially started on Monday where she noticed some RUQ/epigastric pain after eating lunch and dinner. Pain started appx 30 minutes after eating and then lasted for a good two hours and then subsided. She had some nausea with this without any vomiting. However, after lunch yesterday she developed worsening RUQ/epigastric abdominal pain described as aching and dull in nature rated 7/10 in severity with associated nausea and 2-3 episodes of vomiting. This time the pain did not go away and started to get worse. Pain radiated up to her left shoulder blade. She denies any fevers, chills, or altered mental status. Upon presentation to the ED, all vital signs were stable. Labs were significant for mild leukocytosis (11K) with completely normal liver function tests. No evidence of transaminitis or cholestasis. Lipase was 30. RUQ US performed showed cholelithiasis with no evidence of acute cholecystitis but comment on CBD dilatation up to 7.2 mm. A MRCP was performed which was equivocal for choledocholithiasis and small 1-2 mm filling defect in CBD. She was admitted to the medicine service. IV Zosyn was started. GI and General Surgery were consulted for further evaluation and management.  ? ?Past Medical History:  ?Past Medical History:  ?Diagnosis Date  ? Anxiety 06/01/2021  ? COVID 12/2020  ? headache, fever, stuffy nose, loss of taste and smell x few weeks all sx resolved except  taste is altered  ? Depression 06/01/2021  ? Gastric ulcer   ? yrs ago no current problems per pt on 06-01-2021  ? GERD (gastroesophageal reflux disease) 06/01/2021  ? Hyperlipidemia 06/01/2021  ? Hypertension 06/01/2021  ? Hypothyroidism 06/01/2021  ? Menorrhagia 06/01/2021  ? Migraines 06/01/2021  ? regulae headaches also @ times  ? Nocturia 06/01/2021  ? Plantar fasciitis 06/01/2021  ? both feet right worse than left  ? Sleep apnea 06/01/2021  ? cpap severe osa does not know cpap settings  ? SUI (stress urinary incontinence, female) 06/01/2021  ? Wears glasses 06/01/2021  ?  ?Problem List: ?Patient Active Problem List  ? Diagnosis Date Noted  ? Choledocholithiasis 03/14/2022  ? UTI (urinary tract infection) 03/14/2022  ? Iron deficiency anemia due to chronic blood loss 09/27/2021  ? Prediabetes 09/27/2021  ? Morbid obesity ([place]) 09/27/2021  ? Severe sleep apnea 01/18/2021  ? HLD (hyperlipidemia) 08/26/2017  ? Migraines 08/26/2017  ? Essential hypertension 01/28/2017  ? Insomnia 04/02/2014  ? Anxiety and depression 02/19/2014  ? Hypothyroidism 02/19/2014  ? GERD (gastroesophageal reflux disease) 02/19/2014  ?  ?Past Surgical History: ?Past Surgical History:  ?Procedure Laterality Date  ? BLADDER SUSPENSION N/A 06/07/2021  ? Procedure: TRANSVAGINAL TAPE (TVT) PROCEDURE;  Surgeon: [name], DO;  Location: [place];  Service: Gynecology;  Laterality: N/A;  ? CATARACT EXTRACTION, BILATERAL  07/11/2017  ? DILITATION & CURRETTAGE/HYSTROSCOPY WITH NOVASURE ABLATION N/A 01/10/2017  ? Procedure: DILATATION & CURETTAGE/HYSTEROSCOPY WITH NOVASURE ABLATION;  Surgeon: [name], DO;  Location: [place];  Service: Gynecology;  Laterality: N/A;  ? LAPAROSCOPIC TUBAL LIGATION Bilateral 01/10/2017  ?  Procedure: LAPAROSCOPIC TUBAL LIGATION with Filshie Clips;  Surgeon: [name], DO;  Location: [place];  Service: Gynecology;  Laterality: Bilateral;  ? LAPAROSCOPIC VAGINAL  HYSTERECTOMY WITH SALPINGECTOMY Bilateral 06/07/2021  ? Procedure: LAPAROSCOPIC ASSISTED VAGINAL HYSTERECTOMY WITH BILATERAL SALPINGECTOMY;  Surgeon: [name], DO;  Location: [place];  Service: Gynecology;  Laterality: Bilateral;  ? WISDOM TOOTH EXTRACTION  2013  ?  ?Allergies: ?Allergies  ?Allergen Reactions  ? Azithromycin Hives  ?  ?Home Medications: ?Medications Prior to Admission  ?Medication Sig Dispense Refill Last Dose  ? atorvastatin (LIPITOR) 10 MG tablet TAKE 1 TABLET BY MOUTH ONCE DAILY 90 tablet 3 03/13/2022 at 0600  ? buPROPion (WELLBUTRIN SR) 150 MG 12 hr tablet TAKE 1 TABLET BY MOUTH ONCE DAILY 90 tablet 0 03/13/2022 at 0600  ? FLUoxetine (PROZAC) 40 MG capsule Take 1 capsule (40 mg total) by mouth daily. 90 capsule 1 03/13/2022 at 0600  ? hydrOXYzine (ATARAX) 10 MG tablet TAKE 1 TABLET BY MOUTH ONCE DAILY AS NEEDED 30 tablet 0 Past Month at prn  ? levothyroxine (SYNTHROID) 100 MCG tablet TAKE 1 TABLET BY MOUTH EVERY MORNING WITH BREAKFAST 90 tablet 2 03/13/2022 at 0600  ? lisinopril-hydrochlorothiazide (ZESTORETIC) 20-12.5 MG tablet TAKE 1 TABLET BY MOUTH ONCE A DAY 90 tablet 1 03/13/2022 at 0600  ? omeprazole (PRILOSEC) 20 MG capsule TAKE 1 CAPSULE BY MOUTH ONCE DAILY 90 capsule 0 03/13/2022 at 0600  ? ?Home medication reconciliation was completed with the patient.  ? ?Scheduled Inpatient Medications: ?  ? buPROPion  150 mg Oral Daily  ? FLUoxetine  40 mg Oral Daily  ? lisinopril  20 mg Oral Daily  ? And  ? hydrochlorothiazide  12.5 mg Oral Daily  ? [START ON 03/15/2022] levothyroxine  100 mcg Oral Q0600  ?  morphine injection  4 mg Intravenous Once  ? pantoprazole (PROTONIX) IV  40 mg Intravenous Q24H  ? ? ?Continuous Inpatient Infusions: ?  ? sodium chloride 100 mL/hr at 03/14/22 1335  ? piperacillin-tazobactam (ZOSYN)  IV    ? ? ?PRN Inpatient Medications:  ?HYDROmorphone (DILAUDID) injection, hydrOXYzine, ondansetron **OR** ondansetron (ZOFRAN) IV ? ?Family History: ?family history  includes Arthritis in her father, maternal grandfather, maternal grandmother, and paternal grandmother; Breast cancer in her paternal grandmother; Diabetes in her father, maternal grandfather, and maternal grandmother; Hyperlipidemia in her father; Hypertension in her father; Mental illness in her mother.  The patient's family history is negative for inflammatory bowel disorders, GI malignancy, or solid organ transplantation. ? ?Social History:  ? reports that she has never smoked. She has never used smokeless tobacco. She reports current alcohol use. She reports that she does not use drugs. The patient denies ETOH, tobacco, or drug use.  ? ?Review of Systems: ?Constitutional: Weight is stable.  ?Eyes: No changes in vision. ?ENT: No oral lesions, sore throat.  ?GI: see HPI.  ?Heme/Lymph: No easy bruising.  ?CV: No chest pain.  ?GU: No hematuria.  ?Integumentary: No rashes.  ?Neuro: No headaches.  ?Psych: No depression/anxiety.  ?Endocrine: No heat/cold intolerance.  ?Allergic/Immunologic: No urticaria.  ?Resp: No cough, SOB.  ?Musculoskeletal: No joint swelling.  ?  ?Physical Examination: ?BP 139/74 (BP Location: Left Arm)   Pulse 64   Temp 98.2 ?F (36.8 ?C)   Resp 16   Ht '5\' 4"'$  (1.626 m)   Wt 104.3 kg   LMP 05/15/2021   SpO2 97%   BMI 39.48 kg/m?  ?Gen: NAD, alert and oriented x 4 ?HEENT:  PEERLA, EOMI, ?Neck: supple, no JVD or thyromegaly ?Chest: CTA bilaterally, no wheezes, crackles, or other adventitious sounds ?CV: RRR, no m/g/c/r ?Abd: soft, obese abdomen, + bowel sounds present, tender to palpation in RUQ and epigastrium, negative Murphy's sign, no HSM, guarding, ridigity, or rebound tenderness ?Ext: no edema, well perfused with 2+ pulses, ?Skin: no rash or lesions noted ?Lymph: no LAD ? ?Data: ?Lab Results  ?Component Value Date  ? WBC 11.0 (H) 03/14/2022  ? HGB 13.3 03/14/2022  ? HCT 42.0 03/14/2022  ? MCV 91.1 03/14/2022  ? PLT 184 03/14/2022  ? ?Recent Labs  ?Lab 03/14/22 ?0002  ?HGB 13.3  ? ?Lab  Results  ?Component Value Date  ? NA 136 03/14/2022  ? K 3.8 03/14/2022  ? CL 103 03/14/2022  ? CO2 25 03/14/2022  ? BUN 18 03/14/2022  ? CREATININE 0.87 03/14/2022  ? ?Lab Results  ?Component Value Date  ? ALT 16

## 2022-03-14 NOTE — Consult Note (Addendum)
[facility] ?SURGICAL CONSULTATION NOTE (initial) - cpt: 34193 ? ? ?HISTORY OF PRESENT ILLNESS (HPI):  ?51 y.o. female presented to [facility] ED overnight for evaluation of abdominal pain. Patient reports around a 24 hour history of upper abdominal pain which was exacerbated with PO intake. She got no relief from this. She endorses associated nausea and emesis with this. No fever, chills, CP, SOB, urinary changes, bowel changes, or juandice. She denied any history of similar in the past. Previous abdominal surgeries are positive for laparoscopic vaginal hysterectomy and salpingectomy. Work up in the ED revealed very mild leukocytosis to 11.0K, sCr - 0.887, LFTs are normal, bilirubin 0.7, lipase to 30. She did have RUQ [place] which was concerning for cholelithiasis, no evidence of cholecystitis, and there was CBD dilation appreciated to 7.2 mm. She did have follow up MRCP in the ED as well which was equivocal for choledocholithiasis with small 1-2 mm filling defect in CBD. She was admitted to medicine service. Gastroenterology seen at bedside and plan for ERCP tomorrow (04/06).  ? ?Surgery is consulted by hospitalist physician Dr. Francine Graven, MD in this context for evaluation and management of choledocholithiasis. ? ? ?PAST MEDICAL HISTORY (PMH):  ?Past Medical History:  ?Diagnosis Date  ? Anxiety 06/01/2021  ? COVID 12/2020  ? headache, fever, stuffy nose, loss of taste and smell x few weeks all sx resolved except taste is altered  ? Depression 06/01/2021  ? Gastric ulcer   ? yrs ago no current problems per pt on 06-01-2021  ? GERD (gastroesophageal reflux disease) 06/01/2021  ? Hyperlipidemia 06/01/2021  ? Hypertension 06/01/2021  ? Hypothyroidism 06/01/2021  ? Menorrhagia 06/01/2021  ? Migraines 06/01/2021  ? regulae headaches also @ times  ? Nocturia 06/01/2021  ? Plantar fasciitis 06/01/2021  ? both feet right worse than left  ? Sleep apnea 06/01/2021  ? cpap severe osa does not know cpap settings  ? SUI  (stress urinary incontinence, female) 06/01/2021  ? Wears glasses 06/01/2021  ?  ? ?PAST SURGICAL HISTORY ([place]):  ?Past Surgical History:  ?Procedure Laterality Date  ? BLADDER SUSPENSION N/A 06/07/2021  ? Procedure: TRANSVAGINAL TAPE (TVT) PROCEDURE;  Surgeon: [name], DO;  Location: [place];  Service: Gynecology;  Laterality: N/A;  ? CATARACT EXTRACTION, BILATERAL  07/11/2017  ? DILITATION & CURRETTAGE/HYSTROSCOPY WITH NOVASURE ABLATION N/A 01/10/2017  ? Procedure: DILATATION & CURETTAGE/HYSTEROSCOPY WITH NOVASURE ABLATION;  Surgeon: [name], DO;  Location: [place];  Service: Gynecology;  Laterality: N/A;  ? LAPAROSCOPIC TUBAL LIGATION Bilateral 01/10/2017  ? Procedure: LAPAROSCOPIC TUBAL LIGATION with Filshie Clips;  Surgeon: [name], DO;  Location: [place];  Service: Gynecology;  Laterality: Bilateral;  ? LAPAROSCOPIC VAGINAL HYSTERECTOMY WITH SALPINGECTOMY Bilateral 06/07/2021  ? Procedure: LAPAROSCOPIC ASSISTED VAGINAL HYSTERECTOMY WITH BILATERAL SALPINGECTOMY;  Surgeon: [name], DO;  Location: [place];  Service: Gynecology;  Laterality: Bilateral;  ? [place] EXTRACTION  2013  ?  ? ?MEDICATIONS:  ?Prior to Admission medications   ?Medication Sig Start Date End Date Taking? Authorizing Provider  ?atorvastatin (LIPITOR) 10 MG tablet TAKE 1 TABLET BY MOUTH ONCE DAILY 11/15/21   Jearld Fenton, NP  ?buPROPion [facility] SR) 150 MG 12 hr tablet TAKE 1 TABLET BY MOUTH ONCE DAILY 01/18/22   Jearld Fenton, NP  ?FLUoxetine (PROZAC) 40 MG capsule Take 1 capsule (40 mg total) by mouth daily. 09/27/21   Jearld Fenton, NP  ?hydrOXYzine (ATARAX) 10 MG tablet TAKE 1 TABLET  BY MOUTH ONCE DAILY AS NEEDED 02/15/22   Jearld Fenton, NP  ?levothyroxine (SYNTHROID) 100 MCG tablet TAKE 1 TABLET BY MOUTH EVERY MORNING WITH BREAKFAST 12/22/21   Jearld Fenton, NP  ?lisinopril-hydrochlorothiazide (ZESTORETIC) 20-12.5 MG tablet TAKE 1  TABLET BY MOUTH ONCE A DAY 09/29/21   Jearld Fenton, NP  ?meloxicam (MOBIC) 7.5 MG tablet Take 1 tablet (7.5 mg total) by mouth daily. 12/15/21   Sherilyn Cooter, MD  ?nitrofurantoin, macrocrystal-monohydrate, (MACROBID) 100 MG capsule Take 1 capsule (100 mg total) by mouth 2 (two) times daily. 09/28/21   Jearld Fenton, NP  ?omeprazole (PRILOSEC) 20 MG capsule TAKE 1 CAPSULE BY MOUTH ONCE DAILY 08/28/21   Kennyth Arnold, FNP  ?  ? ?ALLERGIES:  ?Allergies  ?Allergen Reactions  ? Azithromycin Hives  ?  ? ?SOCIAL HISTORY:  ?Social History  ? ?Socioeconomic History  ? Marital status: Married  ?  Spouse name: Not on file  ? Number of children: Not on file  ? Years of education: Not on file  ? Highest education level: Not on file  ?Occupational History  ? Not on file  ?Tobacco Use  ? Smoking status: Never  ? Smokeless tobacco: Never  ?Vaping Use  ? Vaping Use: Never used  ?Substance and Sexual Activity  ? Alcohol use: Yes  ?  Comment: occasional  ? Drug use: No  ? Sexual activity: Yes  ?Other Topics Concern  ? Not on file  ?Social History Narrative  ? Not on file  ? ?Social Determinants of Health  ? ?Financial Resource Strain: Not on file  ?Food Insecurity: Not on file  ?Transportation Needs: Not on file  ?Physical Activity: Not on file  ?Stress: Not on file  ?Social Connections: Not on file  ?Intimate Partner Violence: Not on file  ?  ? ?FAMILY HISTORY:  ?Family History  ?Problem Relation Age of Onset  ? Mental illness Mother   ? Arthritis Father   ? Hyperlipidemia Father   ? Hypertension Father   ? Diabetes Father   ? Arthritis Maternal Grandmother   ? Diabetes Maternal Grandmother   ? Arthritis Maternal Grandfather   ? Diabetes Maternal Grandfather   ? Arthritis Paternal Grandmother   ? Breast cancer Paternal Grandmother   ?  ? ? ?REVIEW OF SYSTEMS:  ?Review of Systems  ?Constitutional:  Negative for chills and fever.  ?HENT:  Negative for congestion and sore throat.   ?Respiratory:  Negative for cough and  shortness of breath.   ?Cardiovascular:  Negative for chest pain and palpitations.  ?Gastrointestinal:  Positive for abdominal pain, nausea and vomiting. Negative for constipation and diarrhea.  ?Genitourinary:  Negative for dysuria and urgency.  ?All other systems reviewed and are negative. ? ?VITAL SIGNS:  ?Temp:  [97.8 ?F (36.6 ?C)] 97.8 ?F (36.6 ?C) (04/05 0000) ?Pulse Rate:  [63-79] 63 (04/05 0930) ?Resp:  [11-18] 17 (04/05 0930) ?BP: (130-165)/(73-85) 155/75 (04/05 0930) ?SpO2:  [92 %-100 %] 99 % (04/05 0930) ?Weight:  [104.3 kg] 104.3 kg (04/04 2359)     Height: '5\' 4"'$  (162.6 cm) Weight: 104.3 kg BMI (Calculated): 39.46  ? ?INTAKE/OUTPUT:  ?No intake/output data recorded. ? ?PHYSICAL EXAM:  ?Physical Exam ?Vitals and nursing note reviewed. Exam conducted with a chaperone present.  ?Constitutional:   ?   General: She is not in acute distress. ?   Appearance: She is well-developed. She is obese. She is not ill-appearing.  ?HENT:  ?   Head: Normocephalic and atraumatic.  ?  Eyes:  ?   General: No scleral icterus. ?   Extraocular Movements: Extraocular movements intact.  ?Cardiovascular:  ?   Rate and Rhythm: Normal rate and regular rhythm.  ?   Heart sounds: Normal heart sounds. No murmur heard. ?Pulmonary:  ?   Effort: Pulmonary effort is normal. No respiratory distress.  ?   Breath sounds: Normal breath sounds.  ?Abdominal:  ?   General: Abdomen is protuberant. A surgical scar is present. There is no distension.  ?   Palpations: Abdomen is soft.  ?   Tenderness: There is abdominal tenderness in the right upper quadrant and epigastric area. There is no guarding or rebound. Negative signs include Murphy's sign.  ?   Comments: Abdomen is soft, she has mild tenderness to deep palpation in the RUQ/epigastrium, non-distended, no overt Murphy's sign, no rebound/guarding   ?Genitourinary: ?   Comments: Deferred ?Skin: ?   General: Skin is warm and dry.  ?   Coloration: Skin is not jaundiced or pale.  ?Neurological:  ?    General: No focal deficit present.  ?   Mental Status: She is alert and oriented to person, place, and time.  ?Psychiatric:     ?   Mood and Affect: Mood normal.     ?   Behavior: Behavior normal.  ? ? ? ?Labs:  ? ?

## 2022-03-14 NOTE — ED Provider Notes (Signed)
----------------------------------------- ?  8:23 AM on 03/14/2022 ?----------------------------------------- ?Patient care assumed from Dr. Leonides Schanz.  Patient's MRCP is equivocal showing CBD dilation, but no obvious stone obstructing, normal LFTs including normal total bilirubin.  I spoke to Dr. Virgina Jock of GI medicine who will speak with Dr. Allen Norris.  States he will have his PA come see the patient and we will try to figure out a plan. ? ?Patient will be admitted to the hospitalist service with plan for ERCP tomorrow.  Patient agreeable. ?  ?Harvest Dark, MD ?03/14/22 1006 ? ?

## 2022-03-14 NOTE — Assessment & Plan Note (Addendum)
Blood pressure is uncontrolled secondary to pain ?Continue lisinopril/ hydrochlorothiazide ?

## 2022-03-15 ENCOUNTER — Inpatient Hospital Stay: Payer: Federal, State, Local not specified - PPO | Admitting: Certified Registered Nurse Anesthetist

## 2022-03-15 ENCOUNTER — Encounter
Admission: EM | Disposition: A | Discharge status: Home / Self Care | Payer: Self-pay | Source: Home / Self Care | Attending: Internal Medicine

## 2022-03-15 ENCOUNTER — Encounter: Payer: Self-pay | Admitting: Internal Medicine

## 2022-03-15 ENCOUNTER — Inpatient Hospital Stay: Payer: Federal, State, Local not specified - PPO

## 2022-03-15 DIAGNOSIS — E039 Hypothyroidism, unspecified: Secondary | ICD-10-CM | POA: Diagnosis not present

## 2022-03-15 DIAGNOSIS — K8010 Calculus of gallbladder with chronic cholecystitis without obstruction: Secondary | ICD-10-CM | POA: Diagnosis not present

## 2022-03-15 DIAGNOSIS — R109 Unspecified abdominal pain: Secondary | ICD-10-CM | POA: Diagnosis present

## 2022-03-15 DIAGNOSIS — K8064 Calculus of gallbladder and bile duct with chronic cholecystitis without obstruction: Secondary | ICD-10-CM | POA: Diagnosis not present

## 2022-03-15 DIAGNOSIS — K8050 Calculus of bile duct without cholangitis or cholecystitis without obstruction: Secondary | ICD-10-CM | POA: Diagnosis not present

## 2022-03-15 HISTORY — PX: ERCP: SHX5425

## 2022-03-15 LAB — HEPATIC FUNCTION PANEL
ALT: 14 U/L (ref 0–44)
AST: 15 U/L (ref 15–41)
Albumin: 3.6 g/dL (ref 3.5–5.0)
Alkaline Phosphatase: 56 U/L (ref 38–126)
Bilirubin, Direct: 0.1 mg/dL (ref 0.0–0.2)
Indirect Bilirubin: 0.6 mg/dL (ref 0.3–0.9)
Total Bilirubin: 0.7 mg/dL (ref 0.3–1.2)
Total Protein: 6.3 g/dL — ABNORMAL LOW (ref 6.5–8.1)

## 2022-03-15 LAB — CBC
HCT: 37.4 % (ref 36.0–46.0)
Hemoglobin: 11.9 g/dL — ABNORMAL LOW (ref 12.0–15.0)
MCH: 29.3 pg (ref 26.0–34.0)
MCHC: 31.8 g/dL (ref 30.0–36.0)
MCV: 92.1 fL (ref 80.0–100.0)
Platelets: 151 10*3/uL (ref 150–400)
RBC: 4.06 MIL/uL (ref 3.87–5.11)
RDW: 14.1 % (ref 11.5–15.5)
WBC: 9.7 10*3/uL (ref 4.0–10.5)
nRBC: 0 % (ref 0.0–0.2)

## 2022-03-15 LAB — HIV ANTIBODY (ROUTINE TESTING W REFLEX): HIV Screen 4th Generation wRfx: NONREACTIVE

## 2022-03-15 SURGERY — CHOLECYSTECTOMY, ROBOT-ASSISTED, LAPAROSCOPIC
Anesthesia: General

## 2022-03-15 SURGERY — ERCP, WITH INTERVENTION IF INDICATED
Anesthesia: General

## 2022-03-15 MED ORDER — PIPERACILLIN-TAZOBACTAM 3.375 G IVPB
INTRAVENOUS | Status: AC
  Filled 2022-03-15: qty 50

## 2022-03-15 MED ORDER — PROPOFOL 500 MG/50ML IV EMUL
INTRAVENOUS | Status: AC
  Filled 2022-03-15: qty 50

## 2022-03-15 MED ORDER — OXYCODONE HCL 5 MG PO TABS: 5.0000 mg | ORAL_TABLET | Freq: Once | ORAL | Status: DC | PRN

## 2022-03-15 MED ORDER — DEXAMETHASONE SODIUM PHOSPHATE 10 MG/ML IJ SOLN
INTRAMUSCULAR | Status: DC | PRN
  Administered 2022-03-15: 10 mg via INTRAVENOUS

## 2022-03-15 MED ORDER — FENTANYL CITRATE (PF) 100 MCG/2ML IJ SOLN
INTRAMUSCULAR | Status: AC
Start: 2022-03-15 — End: ?
  Filled 2022-03-15: qty 2

## 2022-03-15 MED ORDER — INDOCYANINE GREEN 25 MG IV SOLR
2.5000 mg | INTRAVENOUS | Status: DC
  Filled 2022-03-15: qty 10

## 2022-03-15 MED ORDER — SODIUM CHLORIDE 0.9 % IV SOLN: INTRAVENOUS | Status: DC

## 2022-03-15 MED ORDER — PROMETHAZINE HCL 25 MG/ML IJ SOLN: 6.2500 mg | INTRAMUSCULAR | Status: DC | PRN

## 2022-03-15 MED ORDER — ROCURONIUM BROMIDE 100 MG/10ML IV SOLN
INTRAVENOUS | Status: DC | PRN
  Administered 2022-03-15: 50 mg via INTRAVENOUS

## 2022-03-15 MED ORDER — SODIUM CHLORIDE 0.9 % IV SOLN: INTRAVENOUS | Status: DC | PRN

## 2022-03-15 MED ORDER — INDOCYANINE GREEN 25 MG IV SOLR
INTRAVENOUS | Status: DC | PRN
  Administered 2022-03-15: 2.5 mg via INTRAVENOUS

## 2022-03-15 MED ORDER — SUGAMMADEX SODIUM 200 MG/2ML IV SOLN
INTRAVENOUS | Status: DC | PRN
  Administered 2022-03-15: 200 mg via INTRAVENOUS

## 2022-03-15 MED ORDER — 0.9 % SODIUM CHLORIDE (POUR BTL) OPTIME
TOPICAL | Status: DC | PRN
  Administered 2022-03-15: 3000 mL

## 2022-03-15 MED ORDER — LACTATED RINGERS IV SOLN: INTRAVENOUS | Status: DC

## 2022-03-15 MED ORDER — OXYCODONE HCL 5 MG/5ML PO SOLN
5.0000 mg | Freq: Once | ORAL | Status: DC | PRN
  Filled 2022-03-15: qty 5

## 2022-03-15 MED ORDER — INDOMETHACIN 50 MG RE SUPP
RECTAL | Status: AC
  Administered 2022-03-15: 100 mg
  Filled 2022-03-15: qty 2

## 2022-03-15 MED ORDER — PROPOFOL 10 MG/ML IV BOLUS
INTRAVENOUS | Status: DC | PRN
  Administered 2022-03-15: 150 mg via INTRAVENOUS
  Administered 2022-03-15: 50 mg via INTRAVENOUS

## 2022-03-15 MED ORDER — BUPIVACAINE-EPINEPHRINE (PF) 0.25% -1:200000 IJ SOLN
INTRAMUSCULAR | Status: DC | PRN
  Administered 2022-03-15: 30 mL

## 2022-03-15 MED ORDER — FENTANYL CITRATE PF 50 MCG/ML IJ SOSY: 25.0000 ug | PREFILLED_SYRINGE | INTRAMUSCULAR | Status: DC | PRN

## 2022-03-15 MED ORDER — SUCCINYLCHOLINE CHLORIDE 200 MG/10ML IV SOSY
PREFILLED_SYRINGE | INTRAVENOUS | Status: DC | PRN
  Administered 2022-03-15: 120 mg via INTRAVENOUS

## 2022-03-15 MED ORDER — ONDANSETRON HCL 4 MG/2ML IJ SOLN
INTRAMUSCULAR | Status: DC | PRN
  Administered 2022-03-15: 4 mg via INTRAVENOUS

## 2022-03-15 MED ORDER — DEXMEDETOMIDINE (PRECEDEX) IN NS 20 MCG/5ML (4 MCG/ML) IV SYRINGE
PREFILLED_SYRINGE | INTRAVENOUS | Status: DC | PRN
  Administered 2022-03-15: 8 ug via INTRAVENOUS
  Administered 2022-03-15: 4 ug via INTRAVENOUS
  Administered 2022-03-15: 8 ug via INTRAVENOUS

## 2022-03-15 MED ORDER — MIDAZOLAM HCL 2 MG/2ML IJ SOLN
INTRAMUSCULAR | Status: DC | PRN
  Administered 2022-03-15: 2 mg via INTRAVENOUS

## 2022-03-15 MED ORDER — LOPERAMIDE HCL 2 MG PO CAPS
4.0000 mg | ORAL_CAPSULE | Freq: Once | ORAL | Status: AC
  Administered 2022-03-15: 4 mg via ORAL
  Filled 2022-03-15: qty 2

## 2022-03-15 MED ORDER — ACETAMINOPHEN 10 MG/ML IV SOLN: 1000.0000 mg | Freq: Once | INTRAVENOUS | Status: DC | PRN

## 2022-03-15 MED ORDER — MIDAZOLAM HCL 2 MG/2ML IJ SOLN
INTRAMUSCULAR | Status: AC
Start: 2022-03-15 — End: ?
  Filled 2022-03-15: qty 2

## 2022-03-15 MED ORDER — HYDROCODONE-ACETAMINOPHEN 5-325 MG PO TABS: 1.0000 | ORAL_TABLET | Freq: Four times a day (QID) | ORAL | Status: DC | PRN

## 2022-03-15 MED ORDER — DROPERIDOL 2.5 MG/ML IJ SOLN
0.6250 mg | Freq: Once | INTRAMUSCULAR | Status: DC | PRN
  Filled 2022-03-15: qty 2

## 2022-03-15 MED ORDER — PROPOFOL 500 MG/50ML IV EMUL
INTRAVENOUS | Status: DC | PRN
  Administered 2022-03-15: 125 ug/kg/min via INTRAVENOUS

## 2022-03-15 MED ORDER — LIDOCAINE HCL (CARDIAC) PF 100 MG/5ML IV SOSY
PREFILLED_SYRINGE | INTRAVENOUS | Status: DC | PRN
  Administered 2022-03-15: 100 mg via INTRAVENOUS

## 2022-03-15 MED ORDER — FENTANYL CITRATE (PF) 100 MCG/2ML IJ SOLN
INTRAMUSCULAR | Status: DC | PRN
  Administered 2022-03-15 (×4): 25 ug via INTRAVENOUS

## 2022-03-15 MED ORDER — FENTANYL CITRATE (PF) 100 MCG/2ML IJ SOLN
INTRAMUSCULAR | Status: AC
  Filled 2022-03-15: qty 2

## 2022-03-15 MED ORDER — TRAZODONE HCL 50 MG PO TABS
50.0000 mg | ORAL_TABLET | Freq: Once | ORAL | Status: AC
  Administered 2022-03-15: 50 mg via ORAL
  Filled 2022-03-15: qty 1

## 2022-03-15 SURGICAL SUPPLY — 49 items
BAG RETRIEVAL 10 (BASKET) ×1
CANNULA CAP OBTURATR AIRSEAL 8 (CAP) IMPLANT
CLIP LIGATING HEM O LOK PURPLE (MISCELLANEOUS) ×2 IMPLANT
COVER TIP SHEARS 8 DVNC (MISCELLANEOUS) ×1 IMPLANT
COVER TIP SHEARS 8MM DA VINCI (MISCELLANEOUS) ×2
DERMABOND ADVANCED (GAUZE/BANDAGES/DRESSINGS) ×1
DERMABOND ADVANCED .7 DNX12 (GAUZE/BANDAGES/DRESSINGS) ×1 IMPLANT
DRAPE ARM DVNC X/XI (DISPOSABLE) ×4 IMPLANT
DRAPE COLUMN DVNC XI (DISPOSABLE) ×1 IMPLANT
DRAPE DA VINCI XI ARM (DISPOSABLE) ×8
DRAPE DA VINCI XI COLUMN (DISPOSABLE) ×2
ELECT CAUTERY BLADE 6.4 (BLADE) ×2 IMPLANT
GLOVE SURG ORTHO LTX SZ7.5 (GLOVE) ×4 IMPLANT
GOWN STRL REUS W/ TWL LRG LVL3 (GOWN DISPOSABLE) ×2 IMPLANT
GOWN STRL REUS W/ TWL XL LVL3 (GOWN DISPOSABLE) ×2 IMPLANT
GOWN STRL REUS W/TWL LRG LVL3 (GOWN DISPOSABLE) ×6
GOWN STRL REUS W/TWL XL LVL3 (GOWN DISPOSABLE) ×4
GRASPER SUT TROCAR 14GX15 (MISCELLANEOUS) ×1 IMPLANT
INFUSOR MANOMETER BAG 3000ML (MISCELLANEOUS) IMPLANT
IRRIGATION STRYKERFLOW (MISCELLANEOUS) IMPLANT
IRRIGATOR STRYKERFLOW (MISCELLANEOUS) ×2
IRRIGATOR SUCT 8 DISP DVNC XI (IRRIGATION / IRRIGATOR) IMPLANT
IRRIGATOR SUCTION 8MM XI DISP (IRRIGATION / IRRIGATOR)
IV NS IRRIG 3000ML ARTHROMATIC (IV SOLUTION) ×1 IMPLANT
KIT PINK PAD W/HEAD ARE REST (MISCELLANEOUS) ×2 IMPLANT
KIT PINK PAD W/HEAD ARM REST (MISCELLANEOUS) ×1 IMPLANT
KIT TURNOVER KIT A (KITS) ×2 IMPLANT
LABEL OR SOLS (LABEL) ×2 IMPLANT
MANIFOLD NEPTUNE II (INSTRUMENTS) ×2 IMPLANT
NDL INSUFFLATION 14GA 120MM (NEEDLE) IMPLANT
NEEDLE HYPO 22GX1.5 SAFETY (NEEDLE) ×2 IMPLANT
NEEDLE INSUFFLATION 14GA 120MM (NEEDLE) ×2 IMPLANT
NS IRRIG 500ML POUR BTL (IV SOLUTION) ×2 IMPLANT
PACK LAP CHOLECYSTECTOMY (MISCELLANEOUS) ×2 IMPLANT
PENCIL ELECTRO HAND CTR (MISCELLANEOUS) ×2 IMPLANT
SEAL CANN UNIV 5-8 DVNC XI (MISCELLANEOUS) ×4 IMPLANT
SEAL XI 5MM-8MM UNIVERSAL (MISCELLANEOUS) ×8
SET TUBE FILTERED XL AIRSEAL (SET/KITS/TRAYS/PACK) IMPLANT
SET TUBE SMOKE EVAC HIGH FLOW (TUBING) ×2 IMPLANT
SOLUTION ELECTROLUBE (MISCELLANEOUS) ×2 IMPLANT
SPIKE FLUID TRANSFER (MISCELLANEOUS) ×2 IMPLANT
SUT MNCRL 4-0 (SUTURE) ×4
SUT MNCRL 4-0 27XMFL (SUTURE) ×2
SUT VICRYL 0 AB UR-6 (SUTURE) ×2 IMPLANT
SUTURE MNCRL 4-0 27XMF (SUTURE) ×1 IMPLANT
SYS BAG RETRIEVAL 10MM (BASKET) ×1
SYSTEM BAG RETRIEVAL 10MM (BASKET) ×1 IMPLANT
TROCAR Z-THREAD FIOS 11X100 BL (TROCAR) IMPLANT
WATER STERILE IRR 500ML POUR (IV SOLUTION) ×1 IMPLANT

## 2022-03-15 NOTE — OR Nursing (Signed)
Anne Mcguire was interviewed by Dr. Daron Offer MD and consent was verified by Dr. Daron Offer MD. Patient has a procedure performed in Endoscopy and was left intubated and transported to the OR to have a Robot Cholecystectomy. I received report from Dr. Daron Offer about the patient. I never physically interviewed the patient before surgery. ?

## 2022-03-15 NOTE — Transfer of Care (Signed)
Immediate Anesthesia Transfer of Care Note ? ?Patient: Anne Mcguire ? ?Procedure(s) Performed: ENDOSCOPIC RETROGRADE CHOLANGIOPANCREATOGRAPHY (ERCP) ? ?Patient Location: PACU ? ?Anesthesia Type:General ? ?Level of Consciousness: drowsy ? ?Airway & Oxygen Therapy: Patient Spontanous Breathing and Patient connected to face mask oxygen ? ?Post-op Assessment: Report given to RN and Post -op Vital signs reviewed and stable ? ?Post vital signs: Reviewed and stable ? ?Last Vitals:  ?Vitals Value Taken Time  ?BP 138/88 03/15/22 1607  ?Temp 36.4 ?C 03/15/22 1607  ?Pulse 78 03/15/22 1607  ?Resp 13 03/15/22 1607  ?SpO2 100 % 03/15/22 1607  ? ? ?Last Pain:  ?Vitals:  ? 03/15/22 1244  ?TempSrc:   ?PainSc: 0-No pain  ?   ? ?Patients Stated Pain Goal: 0 (03/15/22 1244) ? ?Complications: No notable events documented. ?

## 2022-03-15 NOTE — Progress Notes (Signed)
Patient having a restless night. Unable to sleep. Nauseated, diarrhea. Patient stated she "does not feel well". PRN's Zofran and Atarax given with no relief. NP notified.  ?

## 2022-03-15 NOTE — Progress Notes (Signed)
NP at the beside with patient. ?

## 2022-03-15 NOTE — Anesthesia Preprocedure Evaluation (Deleted)
Anesthesia Evaluation  ?Patient identified by MRN, date of birth, ID band ?Patient awake ? ? ? ?Reviewed: ?Allergy & Precautions, H&P , NPO status , Patient's Chart, lab work & pertinent test results ? ?Airway ?Mallampati: III ? ?TM Distance: >3 FB ?Neck ROM: Full ? ? ? Dental ?no notable dental hx. ?(+) Teeth Intact, Dental Advisory Given ?  ?Pulmonary ?sleep apnea and Continuous Positive Airway Pressure Ventilation ,  ?  ?Pulmonary exam normal ?breath sounds clear to auscultation ? ? ? ? ? ? Cardiovascular ?Exercise Tolerance: Good ?hypertension, Pt. on medications ? ?Rhythm:Regular Rate:Normal ? ? ?  ?Neuro/Psych ? Headaches, Anxiety Depression   ? GI/Hepatic ?Neg liver ROS, PUD, GERD  Medicated,Choledocholithiasis ?  ?Endo/Other  ?Hypothyroidism Morbid obesity ? Renal/GU ?negative Renal ROS  ?negative genitourinary ?  ?Musculoskeletal ? ? Abdominal ?  ?Peds ? Hematology ?negative hematology ROS ?(+)   ?Anesthesia Other Findings ? ? Reproductive/Obstetrics ?negative OB ROS ? ?  ? ? ? ? ? ? ? ? ? ? ? ? ? ?  ?  ? ? ? ? ? ? ? ? ?Anesthesia Physical ? ?Anesthesia Plan ? ?ASA: 3 ? ?Anesthesia Plan: General  ? ?Post-op Pain Management: Minimal or no pain anticipated  ? ?Induction: Intravenous and Rapid sequence ? ?PONV Risk Score and Plan: 3 and Ondansetron, Dexamethasone and Midazolam ? ?Airway Management Planned: Oral ETT ? ?Additional Equipment:  ? ?Intra-op Plan:  ? ?Post-operative Plan: Extubation in OR ? ?Informed Consent: I have reviewed the patients History and Physical, chart, labs and discussed the procedure including the risks, benefits and alternatives for the proposed anesthesia with the patient or authorized representative who has indicated his/her understanding and acceptance.  ? ? ? ?Dental advisory given and Interpreter used for interveiw ? ?Plan Discussed with: CRNA and Anesthesiologist ? ?Anesthesia Plan Comments:   ? ? ? ? ? ? ?Anesthesia Quick Evaluation ? ?

## 2022-03-15 NOTE — Anesthesia Preprocedure Evaluation (Addendum)
Anesthesia Evaluation  ?Patient identified by MRN, date of birth, ID band ?Patient awake ? ? ? ?Reviewed: ?Allergy & Precautions, H&P , NPO status , Patient's Chart, lab work & pertinent test results ? ?Airway ?Mallampati: III ? ?TM Distance: >3 FB ?Neck ROM: Full ? ? ? Dental ?no notable dental hx. ?(+) Teeth Intact, Dental Advisory Given ?  ?Pulmonary ?sleep apnea and Continuous Positive Airway Pressure Ventilation ,  ?  ?Pulmonary exam normal ?breath sounds clear to auscultation ? ? ? ? ? ? Cardiovascular ?Exercise Tolerance: Good ?hypertension, Pt. on medications ? ?Rhythm:Regular Rate:Normal ? ? ?  ?Neuro/Psych ? Headaches, Anxiety Depression   ? GI/Hepatic ?Neg liver ROS, PUD, GERD  Medicated,Choledocholithiasis ?  ?Endo/Other  ?Hypothyroidism Morbid obesity ? Renal/GU ?negative Renal ROS  ?negative genitourinary ?  ?Musculoskeletal ? ? Abdominal ?  ?Peds ? Hematology ?negative hematology ROS ?(+)   ?Anesthesia Other Findings ? ? Reproductive/Obstetrics ?negative OB ROS ? ?  ? ? ? ? ? ? ? ? ? ? ? ? ? ?  ?  ? ? ? ? ? ? ? ?Anesthesia Physical ? ?Anesthesia Plan ? ?ASA: 3 ? ?Anesthesia Plan: General  ? ?Post-op Pain Management: Minimal or no pain anticipated  ? ?Induction: Intravenous and Rapid sequence ? ?PONV Risk Score and Plan: 3 and Ondansetron, Dexamethasone and Midazolam ? ?Airway Management Planned: Oral ETT ? ?Additional Equipment:  ? ?Intra-op Plan:  ? ?Post-operative Plan: Extubation in OR ? ?Informed Consent: I have reviewed the patients History and Physical, chart, labs and discussed the procedure including the risks, benefits and alternatives for the proposed anesthesia with the patient or authorized representative who has indicated his/her understanding and acceptance.  ? ? ? ?Dental advisory given and Interpreter used for interveiw ? ?Plan Discussed with: CRNA ? ?Anesthesia Plan Comments:   ? ? ? ? ?Anesthesia Quick Evaluation ? ?

## 2022-03-15 NOTE — Anesthesia Procedure Notes (Addendum)
Procedure Name: Intubation ?Date/Time: 03/15/2022 1:20 PM ?Performed by: Lily Peer, Ayush Boulet, CRNA ?Pre-anesthesia Checklist: Patient identified, Emergency Drugs available, Suction available and Patient being monitored ?Patient Re-evaluated:Patient Re-evaluated prior to induction ?Oxygen Delivery Method: Circle system utilized ?Preoxygenation: Pre-oxygenation with 100% oxygen ?Induction Type: IV induction and Rapid sequence ?Laryngoscope Size: McGraph and 3 ?Grade View: Grade I ?Tube type: Oral ?Number of attempts: 1 ?Airway Equipment and Method: Stylet ?Placement Confirmation: ETT inserted through vocal cords under direct vision, positive ETCO2 and breath sounds checked- equal and bilateral ?Secured at: 21 cm ?Tube secured with: Tape ?Dental Injury: Teeth and Oropharynx as per pre-operative assessment  ? ? ? ? ?

## 2022-03-15 NOTE — Op Note (Signed)
[facility] ?Gastroenterology ?Patient Name: Anne Mcguire ?Procedure Date: 03/15/2022 1:12 PM ?MRN: [id number] ?Account #: [id number] ?Date of Birth: 13-May-1971 ?Admit Type: Inpatient ?Age: 51 ?Room: [facility] ENDO ROOM 3 ?Gender: Female ?Note Status: Finalized ?Instrument Name: [id number] ?Procedure:             ERCP ?Indications:           Bile duct stone on Computed Tomogram Scan ?Providers:             Lucilla Lame MD, MD ?Referring MD:          Jearld Fenton (Referring MD) ?Medicines:             General Anesthesia ?Complications:         No immediate complications. ?Procedure:             Pre-Anesthesia Assessment: ?                       - Prior to the procedure, a History and Physical was  ?                       performed, and patient medications and allergies were  ?                       reviewed. The patient's tolerance of previous  ?                       anesthesia was also reviewed. The risks and benefits  ?                       of the procedure and the sedation options and risks  ?                       were discussed with the patient. All questions were  ?                       answered, and informed consent was obtained. Prior  ?                       Anticoagulants: The patient has taken no previous  ?                       anticoagulant or antiplatelet agents. ASA Grade  ?                       Assessment: II - A patient with mild systemic disease.  ?                       After reviewing the risks and benefits, the patient  ?                       was deemed in satisfactory condition to undergo the  ?                       procedure. ?                       After obtaining informed consent, the scope was passed  ?  under direct vision. Throughout the procedure, the  ?                       patient's blood pressure, pulse, and oxygen  ?                       saturations were monitored continuously. The  ?                       Duodenoscope was  introduced through the mouth, and  ?                       used to inject contrast into and used to inject  ?                       contrast into the bile duct. The ERCP was accomplished  ?                       without difficulty. The patient tolerated the  ?                       procedure well. ?Findings: ?     The scout film was normal. The esophagus was successfully intubated  ?     under direct vision. The scope was advanced to a normal major papilla in  ?     the descending duodenum without detailed examination of the pharynx,  ?     larynx and associated structures, and upper GI tract. The upper GI tract  ?     was grossly normal. The bile duct was deeply cannulated with the  ?     short-nosed traction sphincterotome. Contrast was injected. I personally  ?     interpreted the bile duct images. There was brisk flow of contrast  ?     through the ducts. Image quality was excellent. Contrast extended to the  ?     entire biliary tree. The lower third of the main bile duct contained  ?     filling defect(s) thought to be sludge. A wire was passed into the  ?     biliary tree. A 6 mm biliary sphincterotomy was made with a traction  ?     (standard) sphincterotome using ERBE electrocautery. There was no  ?     post-sphincterotomy bleeding. The biliary tree was swept with a 15 mm  ?     balloon starting at the bifurcation. Sludge was swept from the duct. ?Impression:            - The examination was suspicious for sludge. ?                       - A biliary sphincterotomy was performed. ?                       - The biliary tree was swept and sludge was found. ?Recommendation:        - Discharge patient to home. ?                       - Watch for pancreatitis, bleeding, perforation, and  ?  cholangitis. ?Procedure Code(s):     --- Professional --- ?                       [phone], Endoscopic retrograde cholangiopancreatography  ?                       (ERCP); with removal of calculi/debris from   ?                       biliary/pancreatic duct(s) ?                       93790, Endoscopic retrograde cholangiopancreatography  ?                       (ERCP); with sphincterotomy/papillotomy ?                       74328, Endoscopic catheterization of the biliary  ?                       ductal system, radiological supervision and  ?                       interpretation ?Diagnosis Code(s):     --- Professional --- ?                       K80.50, Calculus of bile duct without cholangitis or  ?                       cholecystitis without obstruction ?CPT copyright 2019 [employer] Association. All rights reserved. ?The codes documented in this report are preliminary and upon coder review may  ?be revised to meet current compliance requirements. ?Lucilla Lame MD, MD ?03/15/2022 1:56:04 PM ?This report has been signed electronically. ?Number of Addenda: 0 ?Note Initiated On: 03/15/2022 1:12 PM ?Estimated Blood Loss:  Estimated blood loss: none. ?     [facility] ?

## 2022-03-15 NOTE — Progress Notes (Signed)
[facility] ?SURGICAL PROGRESS NOTE (cpt [phone]) ? ?Hospital Day(s): 0.  ? ?Interval History: Patient seen and examined, no acute events or new complaints overnight. Patient reports she had a rough night secondary to anxiety, abdominal pain, nausea, and diarrhea. These have now resolved this morning, and she reports feeling much better. No leukocytosis this morning; now resolved; 9.7K. No other labs this morning. She was previously on CLD; tolerated well. NPO this morning for planned ERCP.  ? ?Review of Systems:  ?Constitutional: denies fever, chills  ?HEENT: denies cough or congestion  ?Respiratory: denies any shortness of breath  ?Cardiovascular: denies chest pain or palpitations  ?Gastrointestinal: denies abdominal pain, N/V, or diarrhea/and bowel function as per interval history ?Genitourinary: denies burning with urination or urinary frequency ?Musculoskeletal: denies pain, decreased motor or sensation ? ?Vital signs in last 24 hours: [min-max] current  ?Temp:  [97.5 ?F (36.4 ?C)-98.2 ?F (36.8 ?C)] 97.8 ?F (36.6 ?C) (04/06 5885) ?Pulse Rate:  [63-73] 67 (04/06 0829) ?Resp:  [11-17] 16 (04/06 0829) ?BP: (121-156)/(52-75) 156/64 (04/06 0829) ?SpO2:  [96 %-100 %] 100 % (04/06 0829)     Height: '5\' 4"'$  (162.6 cm) Weight: 104.3 kg BMI (Calculated): 39.46  ? ?Intake/Output last 2 shifts:  ?04/05 0701 - 04/06 0700 ?In: 1247.8 [I.V.:1191.8; IV Piggyback:56] ?Out: -   ? ?Physical Exam:  ?Constitutional: alert, cooperative and no distress  ?HENT: normocephalic without obvious abnormality  ?Eyes: PERRL, EOM's grossly intact and symmetric  ?Respiratory: breathing non-labored at rest  ?Cardiovascular: regular rate and sinus rhythm  ?Gastrointestinal: soft, non-tender, and non-distended ?Musculoskeletal: UE and LE FROM, no edema or wounds, motor and sensation grossly intact, NT  ? ? ?Labs:  ? ?  Latest Ref Rng & Units 03/15/2022  ?  3:58 AM 03/14/2022  ? 12:02 AM 09/27/2021  ?  8:50 AM  ?CBC  ?WBC 4.0 - 10.5 K/uL  9.7   11.0   6.4    ?Hemoglobin 12.0 - 15.0 g/dL 11.9   13.3   12.2    ?Hematocrit 36.0 - 46.0 % 37.4   42.0   38.3    ?Platelets 150 - 400 K/uL 151   184   191    ? ? ?  Latest Ref Rng & Units 03/14/2022  ?  1:20 PM 03/14/2022  ? 12:02 AM 09/27/2021  ?  8:50 AM  ?CMP  ?Glucose 70 - 99 mg/dL 109   122   95    ?BUN 6 - 20 mg/dL '15   18   18    '$ ?Creatinine 0.44 - 1.00 mg/dL 0.70   0.87   0.77    ?Sodium 135 - 145 mmol/L 138   136   141    ?Potassium 3.5 - 5.1 mmol/L 3.4   3.8   4.3    ?Chloride 98 - 111 mmol/L 104   103   104    ?CO2 22 - 32 mmol/L '27   25   31    '$ ?Calcium 8.9 - 10.3 mg/dL 8.3   9.0   9.1    ?Total Protein 6.5 - 8.1 g/dL 6.3   7.5   6.3    ?Total Bilirubin 0.3 - 1.2 mg/dL 0.5   0.7   0.4    ?Alkaline Phos 38 - 126 U/L 58   63     ?AST 15 - 41 U/L '17   18   14    '$ ?ALT 0 - 44 U/L '15   16   13    '$ ? ? ? ?  Imaging studies: No new pertinent imaging studies ? ? ?Assessment/Plan: (ICD-10's: K80.50) ?51 y.o. female with abdominal pain, nausea, emesis found to have cholelithiasis and likely choledocholithiasis without cholecystitis ?  ?           - Appreciate medicine admission ?           - Appreciate GI assistance; Plan for ERCP today ?           - She will benefit from cholecystectomy this admission. Anticipate this will potentially be on Friday (04/07) with Dr Christian Mate pending OR/Anesthesia availability. There is a chance this may be later today, but will ultimately defer timing to Dr Forest Becker availability.  ? - All risks, benefits, and alternatives to above procedure(s) were discussed with the patient, all of her questions were answered to her expressed satisfaction, patient expresses she wishes to proceed, and informed consent was obtained.  ?           - Monitor abdominal examination ?- Monitor LFTs ?- Pain control prn; antiemetics prn ?- Mobilization as tolerated ?- Further management per primary service; we will follow along   ?  ?All of the above findings and recommendations were discussed with the  patient, and all of patient's questions were answered to her expressed satisfaction. ? ?-- ?Edison Simon, PA-C ?[facility] ?03/15/2022, 8:46 AM ?[phone] ?M-F: 7am - 4pm ? ?

## 2022-03-15 NOTE — Op Note (Addendum)
Robotic cholecystectomy with Indocyamine Green Ductal Imaging.  ? ?Pre-operative Diagnosis: Chronic calculus cholecystitis, choledocholithiasis status post ERCP. ? ?Post-operative Diagnosis:  Same. ? ?Procedure: Robotic assisted laparoscopic cholecystectomy with Indocyamine Green Ductal Imaging.  ? ?Surgeon: Ronny Bacon, M.D., FACS ? ?Anesthesia: General. with endotracheal tube ? ?Findings: Large gallstones within gallbladder.  Expected edema. ? ?Estimated Blood Loss: 15 mL ?        ?Drains: None ?        ?Specimens: Gallbladder     ?      ?Complications: none ? ?Procedure Details  ?The patient was seen again in the Holding Room.  2.5 mg dose of ICG was administered intravenously.   ?The benefits, complications, treatment options, risks and expected outcomes were again reviewed with the patient. The likelihood of improving the patient's symptoms with return to their baseline status is good.  The patient and/or family concurred with the proposed plan, giving informed consent, again alternatives reviewed.  The patient was taken to Operating Room, identified, and the procedure verified as robotic assisted laparoscopic cholecystectomy. ? ?Prior to the induction of general anesthesia, antibiotic prophylaxis was administered. VTE prophylaxis was in place. General endotracheal anesthesia was then administered and tolerated well. The patient was positioned in the supine position.  After the induction, the abdomen was prepped with Chloraprep and draped in the sterile fashion.  ?A Time Out was held and the above information confirmed. ? ?After local infiltration of quarter percent Marcaine with epinephrine, stab incision was made left upper quadrant.  Just below the costal margin at Palmer's point, approximately midclavicular line the Veres needle is passed with sensation of the layers to penetrate the abdominal wall and into the peritoneum.  Saline drop test is confirmed peritoneal placement.  Insufflation is initiated  with carbon dioxide to pressures of 15 mmHg. ? ?Right infra-umbilical local infiltration with quarter percent Marcaine with epinephrine is utilized.  Made a 12 mm incision on the right periumbilical site, I advanced an optical 18m port under direct visualization into the peritoneal cavity.  Once the peritoneum was penetrated, insufflation was initiated.  The trocar was then advanced into the abdominal cavity under direct visualization. ?Pneumoperitoneum was then continued with Air seal utilizing CO2 at 15 mmHg or less and tolerated well without any adverse changes in the patient's vital signs.  Two 8.5-mm ports were placed in the left lower quadrant and laterally, and one to the right lower quadrant, all under direct vision. All skin incisions  were infiltrated with a local anesthetic agent before making the incision and placing the trocars.  ?The patient was positioned  in reverse Trendelenburg, tilted the patient's left side down.  Da Vinci XI robot was then positioned on to the patient's left side, and docked. ? ?The gallbladder was identified, the fundus grasped via the arm 4 Prograsp and retracted cephalad. Adhesions were lysed with scissors and cautery.  The infundibulum was identified grasped and retracted laterally, exposing the peritoneum overlying the triangle of Calot. This was then opened and dissected using cautery & scissors. An extended critical view of the cystic duct and cystic artery was obtained, aided by the ICG via FireFly which enabled ready visualization of the ductal anatomy.   ? ?The cystic duct was clearly identified and dissected to isolation.   Artery well isolated and clipped, and the cystic duct was triple clipped and divided with scissors, as close to the gallbladder neck as feasible, thus leaving two on the remaining stump.  The specimen side of the  artery is sealed with bipolar and divided with monopolar scissors.  ? ?The gallbladder was taken from the gallbladder fossa in a  retrograde fashion with the electrocautery. The gallbladder was removed and placed in an Endocatch bag.  ?The liver bed is inspected. Hemostasis was confirmed.  ?The robot was undocked and moved away from the operative field. ?3 L irrigation was utilized and was aspirated clear.  The gallbladder and Endocatch sac were then removed through the infraumbilical port site.  ?Where the fascial and dermal opening had to be widened for the large stones.  ?Inspection of the right upper quadrant was performed. No bleeding, bile duct injury or leak, or bowel injury was noted. The infra-umbilical port site fascia was closed with interrumpted 0 Vicryl sutures using PMI/cone under direct visualization. Pneumoperitoneum was released and ports removed.  4-0 subcuticular Monocryl was used to close the skin. Dermabond was  applied.  The patient was then extubated and brought to the recovery room in stable condition. Sponge, lap, and needle counts were correct at closure and at the conclusion of the case.  ?        ?     ?Ronny Bacon, M.D., FACS ?03/15/2022 4:06 PM ? ?

## 2022-03-15 NOTE — Anesthesia Postprocedure Evaluation (Signed)
Anesthesia Post Note ? ?Patient: Anne Mcguire ? ?Procedure(s) Performed: ENDOSCOPIC RETROGRADE CHOLANGIOPANCREATOGRAPHY (ERCP) ? ?Patient location during evaluation: PACU ?Anesthesia Type: General ?Level of consciousness: awake and alert, oriented and patient cooperative ?Pain management: pain level controlled ?Vital Signs Assessment: post-procedure vital signs reviewed and stable ?Respiratory status: spontaneous breathing, nonlabored ventilation and respiratory function stable ?Cardiovascular status: blood pressure returned to baseline and stable ?Postop Assessment: adequate PO intake ?Anesthetic complications: no ? ? ?No notable events documented. ? ? ?Last Vitals:  ?Vitals:  ? 03/15/22 1630 03/15/22 1645  ?BP: (!) 147/89 (!) 141/79  ?Pulse: 80 76  ?Resp: 18 14  ?Temp:  (!) 36.2 ?C  ?SpO2: 100% 95%  ?  ?Last Pain:  ?Vitals:  ? 03/15/22 1645  ?TempSrc:   ?PainSc: Asleep  ? ? ?  ?  ?  ?  ?  ?  ? ?Darrin Nipper ? ? ? ? ?

## 2022-03-15 NOTE — Progress Notes (Addendum)
? ?GI Inpatient Follow-up Note ? ?Patient Identification: ?Anne Mcguire is a 51 y.o. female with PMH of HTN, morbid obesity, anxiety, depression, and hypothyroidism admitted for RUQ pain, N/V. ? ?Subjective: had 3 episodes of non bloody emesis overnight, also had a couple episodes of diarrhea, abdominal pain drastically better than yesterday and currently denies abd pain. Felt anxious overnight.  ? ?Scheduled Inpatient Medications:  ? buPROPion  150 mg Oral Daily  ? FLUoxetine  40 mg Oral Daily  ? lisinopril  20 mg Oral Daily  ? And  ? hydrochlorothiazide  12.5 mg Oral Daily  ? levothyroxine  100 mcg Oral Q0600  ?  morphine injection  4 mg Intravenous Once  ? pantoprazole (PROTONIX) IV  40 mg Intravenous Q24H  ? ? ?Continuous Inpatient Infusions: ?  ? sodium chloride 100 mL/hr at 03/14/22 1636  ? piperacillin-tazobactam (ZOSYN)  IV 3.375 g (03/15/22 0530)  ? ? ?PRN Inpatient Medications:  ?HYDROmorphone (DILAUDID) injection, hydrOXYzine, ondansetron **OR** ondansetron (ZOFRAN) IV ? ?Review of Systems: ?Constitutional: Weight is stable.  ?Eyes: No changes in vision. ?ENT: No oral lesions, sore throat.  ?GI: see HPI.  ?Heme/Lymph: No easy bruising.  ?CV: No chest pain.  ?GU: No hematuria.  ?Integumentary: No rashes.  ?Neuro: No headaches.  ?Psych: No depression/anxiety.  ?Endocrine: No heat/cold intolerance.  ?Allergic/Immunologic: No urticaria.  ?Resp: No cough, SOB.  ?Musculoskeletal: No joint swelling.  ?  ?Physical Examination: ?BP (!) 156/64 (BP Location: Left Arm)   Pulse 67   Temp 97.8 ?F (36.6 ?C)   Resp 16   Ht '5\' 4"'$  (1.626 m)   Wt 104.3 kg   LMP 05/15/2021   SpO2 100%   BMI 39.48 kg/m?  ?Gen: NAD, alert and oriented x 4 ?HEENT: PEERLA, EOMI, ?Neck: supple, no JVD or thyromegaly ?Chest: CTA bilaterally, no wheezes, crackles, or other adventitious sounds ?CV: RRR, no m/g/c/r ?Abd: soft, minimally TTP diffusely in upper abd, ND, +BS in all four quadrants; no HSM, guarding, ridigity, or rebound  tenderness ?Ext: no edema, well perfused with 2+ pulses, ?Skin: no rash or lesions noted ?Lymph: no LAD ? ?Data: ?Lab Results  ?Component Value Date  ? WBC 9.7 03/15/2022  ? HGB 11.9 (L) 03/15/2022  ? HCT 37.4 03/15/2022  ? MCV 92.1 03/15/2022  ? PLT 151 03/15/2022  ? ?Recent Labs  ?Lab 03/14/22 ?0002 03/15/22 ?5625  ?HGB 13.3 11.9*  ? ?Lab Results  ?Component Value Date  ? NA 138 03/14/2022  ? K 3.4 (L) 03/14/2022  ? CL 104 03/14/2022  ? CO2 27 03/14/2022  ? BUN 15 03/14/2022  ? CREATININE 0.70 03/14/2022  ? ?Lab Results  ?Component Value Date  ? ALT 14 03/15/2022  ? AST 15 03/15/2022  ? ALKPHOS 56 03/15/2022  ? BILITOT 0.7 03/15/2022  ? ?No results for input(s): APTT, INR, PTT in the last 168 hours. ? ?Assessment/Plan: ? ?Anne Mcguire is a 51 y.o. female of morbid obesity, anxiety, depression, HTN, and hypothyroidism presented to the [facility]/13/[facility] for acute RUQ/epigastric abdominal pain c/w biliary colic and imaging suggestive of extrahepatic biliary dilatation with CBD dilatation.  ? ?Choledocholithiasis - currently on Zosyn. Abd pain improved drastically. Still w/ mild nausea & diarrhea overnight controlled on PRN zofran and imodium. Currently NPO for ERCP this afternoon. Planning for [facility] tomorrow 4/7. AM LFTs normal.  ? ?Pancreatic cyst-MRI/MRCP commented on 2.2 x 1.3 x 1.5 cm cyst suggestive of small pancreatic pseudocyst versus side-branch IPMN. 74-monthrepeat MRI is recommended for surveillance to  confirm stability ? ? ?Recommendations: ? ?NPO until ERCP today ?Continue Zoysn, antiemetics, pain control. ?Monitor daily LFTS  ?Discussed ERCP for sphincterotomy +/- stone extraction, r/b/a, risk of post ERCP pancreatitis discussed. Pt consents to proceed.Marland Kitchen ?Surgery planning for [facility] tomorrow ?PCP Anne Mcguire can arrange 6 month f/up MRCP ? ? ?Case discussed w/ Dr. Virgina Jock. Please call with questions or concerns. ? ? ? ?Laurine Blazer, PA-C ?[facility]  ? ? ? ?

## 2022-03-15 NOTE — Progress Notes (Signed)
When to see patient level. She was in the endoscopy suite called up there found out patient is gone for laparoscopic robotic cholecystectomy. ?Unable to see patient today. Discussed with husband in the room. ?No charge ?

## 2022-03-15 NOTE — Progress Notes (Signed)
Pt resting in the bed. ?

## 2022-03-16 DIAGNOSIS — R109 Unspecified abdominal pain: Secondary | ICD-10-CM | POA: Diagnosis not present

## 2022-03-16 DIAGNOSIS — K8050 Calculus of bile duct without cholangitis or cholecystitis without obstruction: Secondary | ICD-10-CM | POA: Diagnosis not present

## 2022-03-16 MED ORDER — HYDROCODONE-ACETAMINOPHEN 5-325 MG PO TABS: 1.0000 | ORAL_TABLET | Freq: Three times a day (TID) | ORAL | 0 refills | Status: DC | PRN

## 2022-03-16 MED ORDER — PANTOPRAZOLE SODIUM 40 MG PO TBEC: 40.0000 mg | DELAYED_RELEASE_TABLET | Freq: Every day | ORAL | Status: DC

## 2022-03-16 NOTE — Discharge Instructions (Signed)
In addition to included general post-operative instructions, ? ?Diet: Resume home diet. Recommend avoiding or limiting fatty/greasy foods over the next few days/week. If you do eat these, you may (or may not) notice diarrhea. This is expected while your body adjusts to not having a gallbladder, and it typically resolves with time.   ? ?Activity: No heavy lifting >20 pounds (children, pets, laundry, garbage) for 4 weeks, but light activity and walking are encouraged. Do not drive or drink alcohol if taking narcotic pain medications or having pain that might distract from driving. ? ?Wound care: 2 days after surgery (04/08), you may shower/get incision wet with soapy water and pat dry (do not rub incisions), but no baths or submerging incision underwater until follow-up.  ? ?Medications: Resume all home medications. For mild to moderate pain: acetaminophen (Tylenol) or ibuprofen/naproxen (if no kidney disease). Combining Tylenol with alcohol can substantially increase your risk of causing liver disease. Narcotic pain medications, if prescribed, can be used for severe pain, though may cause nausea, constipation, and drowsiness. Do not combine Tylenol and Percocet (or similar) within a 6 hour period as Percocet (and similar) contain(s) Tylenol. If you do not need the narcotic pain medication, you do not need to fill the prescription. ? ?Call office [phone] / [phone]) at any time if any questions, worsening pain, fevers/chills, bleeding, drainage from incision site, or other concerns. ? ?

## 2022-03-16 NOTE — Progress Notes (Signed)
[facility] ?SURGICAL PROGRESS NOTE ? ?Hospital Day(s): 1.  ? ?Post op day(s): 1 Day Post-Op.  ? ?Interval History:  ?Patient seen and examined ?No acute events or new complaints overnight.  ?Patient reports overnight she had some oozing from her incisions but this has ceased.  ?No fever, chills, nausea, emesis ?No new labs this morning ?She has been advanced to soft diet; tolerating well  ? ? ?Vital signs in last 24 hours: [min-max] current  ?Temp:  [97.2 ?F (36.2 ?C)-98.5 ?F (36.9 ?C)] 98.3 ?F (36.8 ?C) (04/07 0515) ?Pulse Rate:  [64-80] 67 (04/07 0515) ?Resp:  [13-20] 16 (04/06 1954) ?BP: (133-161)/(52-89) 149/80 (04/07 0515) ?SpO2:  [95 %-100 %] 97 % (04/07 0515) ?Weight:  [104.3 kg] 104.3 kg (04/06 1244)     Height: '5\' 4"'$  (162.6 cm) Weight: 104.3 kg BMI (Calculated): 39.46  ? ?Intake/Output last 2 shifts:  ?04/06 0701 - 04/07 0700 ?In: 1250 [I.V.:1200; IV Piggyback:50] ?Out: 10 [Blood:10]  ? ?Physical Exam:  ?Constitutional: alert, cooperative and no distress  ?Respiratory: breathing non-labored at rest  ?Cardiovascular: regular rate and sinus rhythm  ?Gastrointestinal: soft, non-tender, and non-distended, no rebound/guarding ?Integumentary: Laparoscopic incisions are intact with dermabond, no evidence of dehiscence or bleeding this morning, some ecchymosis, no erythema. No active drainage  ? ?Labs:  ? ?  Latest Ref Rng & Units 03/15/2022  ?  3:58 AM 03/14/2022  ? 12:02 AM 09/27/2021  ?  8:50 AM  ?CBC  ?WBC 4.0 - 10.5 K/uL 9.7   11.0   6.4    ?Hemoglobin 12.0 - 15.0 g/dL 11.9   13.3   12.2    ?Hematocrit 36.0 - 46.0 % 37.4   42.0   38.3    ?Platelets 150 - 400 K/uL 151   184   191    ? ? ?  Latest Ref Rng & Units 03/15/2022  ?  8:51 AM 03/14/2022  ?  1:20 PM 03/14/2022  ? 12:02 AM  ?CMP  ?Glucose 70 - 99 mg/dL  109   122    ?BUN 6 - 20 mg/dL  15   18    ?Creatinine 0.44 - 1.00 mg/dL  0.70   0.87    ?Sodium 135 - 145 mmol/L  138   136    ?Potassium 3.5 - 5.1 mmol/L  3.4   3.8    ?Chloride 98 - 111 mmol/L   104   103    ?CO2 22 - 32 mmol/L  27   25    ?Calcium 8.9 - 10.3 mg/dL  8.3   9.0    ?Total Protein 6.5 - 8.1 g/dL 6.3   6.3   7.5    ?Total Bilirubin 0.3 - 1.2 mg/dL 0.7   0.5   0.7    ?Alkaline Phos 38 - 126 U/L 56   58   63    ?AST 15 - 41 U/L '15   17   18    '$ ?ALT 0 - 44 U/L '14   15   16    '$ ? ?Imaging studies: No new pertinent imaging studies ? ? ?Assessment/Plan:  ?51 y.o. female 1 Day Post-Op s/p ERCP and concurrent robotic assisted laparoscopic cholecystectomy for choledocholithiasis without cholecystitis ? ? - Okay to continue regular diet as tolerated; Recommend limiting fatty foods in immediate post-op period ? - Monitor abdominal examination; on-going bowel function ?- Pain control prn; antiemetics prn  ? - Mobilization as tolerated  ? - Further management per primary service ? ?-  Discharge Planning: Okay for discharge from surgical perspective; follow up and DC instructions updated.   ? ?All of the above findings and recommendations were discussed with the patient, and the medical team, and all of patient's questions were answered to her expressed satisfaction. ? ?-- ?Edison Simon, PA-C ?[facility] ?03/16/2022, 7:21 AM ?[phone] ?M-F: 7am - 4pm ? ?

## 2022-03-16 NOTE — Progress Notes (Signed)
? Inpatient Follow-up/Progress Note ?  ?Patient ID: Anne Mcguire is a 51 y.o. female. ? ?Overnight Events / Subjective Findings ?Pt underwent ERCP with sphincterotomy and sludge removal as well as robotic lap cholecystectomy yesterday. ?Pt seen resting comfortably and eating breakfast. Tolerating PO without issue. Only discomfort at incision sites. Diarrhea and n/v have improved. Passing gas, no full BM at this time. ?No other acute GI complaints. ? ?Review of Systems  ?Constitutional:  Negative for activity change, appetite change, chills, diaphoresis, fatigue, fever and unexpected weight change.  ?HENT:  Negative for trouble swallowing and voice change.   ?Respiratory:  Negative for shortness of breath and wheezing.   ?Cardiovascular:  Negative for chest pain, palpitations and leg swelling.  ?Gastrointestinal:  Negative for abdominal distention, abdominal pain, anal bleeding, blood in stool, constipation, diarrhea, nausea, rectal pain and vomiting.  ?Musculoskeletal:  Negative for arthralgias and myalgias.  ?Skin:  Negative for color change and pallor.  ?Neurological:  Negative for dizziness, syncope and weakness.  ?Psychiatric/Behavioral:  Negative for confusion.   ?All other systems reviewed and are negative.  ? ?Medications ? ?Current Facility-Administered Medications:  ?  0.9 %  sodium chloride infusion, , Intravenous, PRN, Ronny Bacon, MD ?  buPROPion [facility] SR) 12 hr tablet 150 mg, 150 mg, Oral, Daily, Ronny Bacon, MD, 150 mg at 03/14/22 1259 ?  FLUoxetine (PROZAC) capsule 40 mg, 40 mg, Oral, Daily, Ronny Bacon, MD, 40 mg at 03/14/22 1257 ?  lisinopril (ZESTRIL) tablet 20 mg, 20 mg, Oral, Daily, 20 mg at 03/14/22 1257 **AND** hydrochlorothiazide (HYDRODIURIL) tablet 12.5 mg, 12.5 mg, Oral, Daily, Ronny Bacon, MD, 12.5 mg at 03/14/22 1256 ?  HYDROcodone-acetaminophen (NORCO/VICODIN) 5-325 MG per tablet 1-2 tablet, 1-2 tablet, Oral, Q6H PRN, Ronny Bacon, MD ?  HYDROmorphone  (DILAUDID) injection 1 mg, 1 mg, Intravenous, Q4H PRN, Ronny Bacon, MD, 1 mg at 03/15/22 1951 ?  hydrOXYzine (ATARAX) tablet 10 mg, 10 mg, Oral, Daily PRN, Ronny Bacon, MD, 10 mg at 03/15/22 0203 ?  levothyroxine (SYNTHROID) tablet 100 mcg, 100 mcg, Oral, Q0600, Ronny Bacon, MD, 100 mcg at 03/16/22 7253 ?  morphine (PF) 4 MG/ML injection 4 mg, 4 mg, Intravenous, Once, Ronny Bacon, MD ?  ondansetron (ZOFRAN) tablet 4 mg, 4 mg, Oral, Q6H PRN **OR** ondansetron (ZOFRAN) injection 4 mg, 4 mg, Intravenous, Q6H PRN, Ronny Bacon, MD, 4 mg at 03/14/22 2116 ?  pantoprazole (PROTONIX) injection 40 mg, 40 mg, Intravenous, Q24H, Ronny Bacon, MD, 40 mg at 03/15/22 1030 ?  piperacillin-tazobactam (ZOSYN) IVPB 3.375 g, 3.375 g, Intravenous, Q8H, Ronny Bacon, MD, Last Rate: 12.5 mL/hr at 03/16/22 0524, 3.375 g at 03/16/22 0524 ? sodium chloride    ? piperacillin-tazobactam (ZOSYN)  IV 3.375 g (03/16/22 0524)  ?  ?sodium chloride, HYDROcodone-acetaminophen, HYDROmorphone (DILAUDID) injection, hydrOXYzine, ondansetron **OR** ondansetron (ZOFRAN) IV  ? ?Objective  ? ? ?Vitals:  ? 03/15/22 1713 03/15/22 1954 03/16/22 0515 03/16/22 0746  ?BP: (!) 150/83 (!) 161/85 (!) 149/80 (!) 144/78  ?Pulse: 64 79 67 (!) 56  ?Resp: '16 16  18  '$ ?Temp: (!) 97.5 ?F (36.4 ?C) 98.5 ?F (36.9 ?C) 98.3 ?F (36.8 ?C) 98.1 ?F (36.7 ?C)  ?TempSrc: Oral     ?SpO2: 97% 98% 97% 96%  ?Weight:      ?Height:      ? ? ? ?Physical Exam ?Vitals and nursing note reviewed.  ?Constitutional:   ?   General: She is not in acute distress. ?   Appearance: Normal appearance. She is  obese. She is not ill-appearing, toxic-appearing or diaphoretic.  ?HENT:  ?   Head: Normocephalic and atraumatic.  ?   Nose: Nose normal.  ?   Mouth/Throat:  ?   Mouth: Mucous membranes are moist.  ?   Pharynx: Oropharynx is clear.  ?Eyes:  ?   General: No scleral icterus. ?   Extraocular Movements: Extraocular movements intact.  ?Cardiovascular:  ?   Rate and Rhythm:  Normal rate and regular rhythm.  ?   Heart sounds: Normal heart sounds. No murmur heard. ?  No friction rub. No gallop.  ?Pulmonary:  ?   Effort: Pulmonary effort is normal. No respiratory distress.  ?   Breath sounds: Normal breath sounds. No wheezing, rhonchi or rales.  ?Abdominal:  ?   General: Bowel sounds are normal. There is no distension.  ?   Palpations: Abdomen is soft.  ?   Tenderness: There is no abdominal tenderness. There is no guarding or rebound.  ?   Comments: Incision sites CDI.  ?Musculoskeletal:  ?   Cervical back: Neck supple.  ?   Right lower leg: No edema.  ?   Left lower leg: No edema.  ?Skin: ?   General: Skin is warm and dry.  ?   Coloration: Skin is not jaundiced or pale.  ?Neurological:  ?   General: No focal deficit present.  ?   Mental Status: She is alert and oriented to person, place, and time. Mental status is at baseline.  ?Psychiatric:     ?   Mood and Affect: Mood normal.     ?   Behavior: Behavior normal.     ?   Thought Content: Thought content normal.     ?   Judgment: Judgment normal.  ? ? ? ?Laboratory Data ?Recent Labs  ?Lab 03/14/22 ?0002 03/15/22 ?6808  ?WBC 11.0* 9.7  ?HGB 13.3 11.9*  ?HCT 42.0 37.4  ?PLT 184 151  ?NEUTOPHILPCT 79  --   ?LYMPHOPCT 14  --   ?MONOPCT 6  --   ?EOSPCT 1  --   ? ?Recent Labs  ?Lab 03/14/22 ?0002 03/14/22 ?1320 03/15/22 ?0851  ?NA 136 138  --   ?K 3.8 3.4*  --   ?CL 103 104  --   ?CO2 25 27  --   ?BUN 18 15  --   ?CREATININE 0.87 0.70  --   ?CALCIUM 9.0 8.3*  --   ?PROT 7.5 6.3* 6.3*  ?BILITOT 0.7 0.5 0.7  ?ALKPHOS 63 58 56  ?ALT '16 15 14  '$ ?AST '18 17 15  '$ ?GLUCOSE 122* 109*  --   ? ?No results for input(s): INR in the last 168 hours. ?  ? ?Imaging Studies: ?DG C-Arm 1-60 Min-No Report ? ?Result Date: 03/15/2022 ?Fluoroscopy was utilized by the requesting physician.  No radiographic interpretation.   ? ?Assessment:  ? ?# Choledocholithiasis ?- s/p ERCP with sphincterotomy with balloon sweep sludge removal on 03/16/22 ?- s/p robotic lap cholecystectomy  on 03/16/22 ?- LFTs have remained within normal limits ? ?# Pancreatic cyst - MRI/MRCP commented on 2.2 x 1.3 x 1.5 cm cyst suggestive of small pancreatic pseudocyst versus side-branch IPMN. ? ?# UTI- on abx ?# Morbid Obesity ?# GERD ? ?Plan:  ?Pt s/p procdures as above. Doing well ?Tolerating Po ?Ok to d/c abx from GI standpoint- defer to primary team as also being treated for UTI ?Supportive care- pain control, ivf, anti emetics as per primary team ?Advance diet as tolerated ? ?Repeat  MRI MRCP in 6 months for pancreatic cyst surveillance.  Can be ordered by primary care.  If still persists can make referral to GI. Reiterated this to patient who verbalized understanding. ? ?GI to sign off. Available as needed. Please do not hesitate to call regarding questions or concerns. ? ?I personally performed the service. ? ?Management of other medical comorbidities as per primary team ? ?Thank you for allowing [place] to participate in this patient's care. ? ?Annamaria Helling, DO ?[facility] ? ?Portions of the record may have been created with voice recognition software. Occasional wrong-word or 'sound-a-like' substitutions may have occurred due to the inherent limitations of voice recognition software.  Read the chart carefully and recognize, using context, where substitutions may have occurred. ? ?

## 2022-03-16 NOTE — Discharge Summary (Signed)
?Physician Discharge Summary ?  ?Patient: Anne Mcguire MRN: [id number] DOB: 25-Jul-1971  ?Admit date:     03/14/2022  ?Discharge date: 03/16/22  ?Discharge Physician: Fritzi Mandes  ? ?PCP: Jearld Fenton, NP  ? ?Recommendations at discharge:  ? ?follow-up surgery in 2 to 3 weeks ?follow-up with your primary care physician in week to 10 days ? ?Hospital Course: ?Anne Mcguire is a 51 y.o. female with medical history significant for morbid obesity, depression, anxiety, hypothyroidism, hypertension who presents to the ER for evaluation of epigastric pain/right upper quadrant which she has had intermittently for 2 days but worse over the last 24 hours ? ?abdominal pain secondary to  Choledocholithiasis/cholelithiasis ?-- patient is status post ERCP by Dr. Allen Norris ?--s/p ERCP with sphincterotomy with balloon sweep sludge removal on 03/16/22 ?--s/p robotic lap cholecystectomy on 03/16/22 ?- LFTs have remained within normal limits ?-- tolerating PO diet well. Okay to discharge from G.I. and surgery standpoint. ?-- Patient will follow-up with G.I. and surgery as outpatient ?-- no indication for PO antibiotic per G.I. and surgery ? ?pancreatic cyst noted on MRI ?-- 2.2x 1.3 x 1.5 cm pancreatic cyst versus branch of IPMN ?-- patient advised to get MRI/MRCP and six-month for pancreatic cyst surveillance. Defer to primary care/G.I. ? ?Hypertension ?-- resume home meds ? ?overall hemodynamically stable will discharged to home. Patient is in agreement. No family at ? ? ?  ? ? ?Consultants: G.I. Dr. Virgina Jock, surgery Dr. Christian Mate ?Procedures performed: ERCP, laparoscopic cholecystectomy ?Disposition: Home ?Diet recommendation:  ?Discharge Diet Orders (From admission, onward)  ? ?  Start     Ordered  ? 03/16/22 0000  Diet - low sodium heart healthy       ? 03/16/22 1008  ? ?  ?  ? ?  ? ?Cardiac diet ?DISCHARGE MEDICATION: ?Allergies as of 03/16/2022   ? ?   Reactions  ? Azithromycin Hives  ? ?  ? ?  ?Medication List  ?  ? ?TAKE these  medications   ? ?atorvastatin 10 MG tablet ?Commonly known as: LIPITOR ?TAKE 1 TABLET BY MOUTH ONCE DAILY ?  ?buPROPion 150 MG 12 hr tablet ?Commonly known as: WELLBUTRIN SR ?TAKE 1 TABLET BY MOUTH ONCE DAILY ?  ?FLUoxetine 40 MG capsule ?Commonly known as: PROZAC ?Take 1 capsule (40 mg total) by mouth daily. ?  ?HYDROcodone-acetaminophen 5-325 MG tablet ?Commonly known as: NORCO/VICODIN ?Take 1-2 tablets by mouth 3 (three) times daily as needed for moderate pain. ?  ?hydrOXYzine 10 MG tablet ?Commonly known as: ATARAX ?TAKE 1 TABLET BY MOUTH ONCE DAILY AS NEEDED ?  ?levothyroxine 100 MCG tablet ?Commonly known as: SYNTHROID ?TAKE 1 TABLET BY MOUTH EVERY MORNING WITH BREAKFAST ?  ?lisinopril-hydrochlorothiazide 20-12.5 MG tablet ?Commonly known as: ZESTORETIC ?TAKE 1 TABLET BY MOUTH ONCE A DAY ?  ?omeprazole 20 MG capsule ?Commonly known as: PRILOSEC ?TAKE 1 CAPSULE BY MOUTH ONCE DAILY ?  ? ?  ? ?  ?  ? ? ?  ?Discharge Care Instructions  ?(From admission, onward)  ?  ? ? ?  ? ?  Start     Ordered  ? 03/16/22 0000  Discharge wound care:       ?Comments: As per surgery d/c instructions  ? 03/16/22 1008  ? ?  ?  ? ?  ? ? Follow-up Information   ? ? Tylene Fantasia, PA-C. Schedule an appointment as soon as possible for a visit in 3 week(s).   ?Specialty: Physician Assistant ?Why: s/p  laparoscopic cholecystectomy ?Contact information: ?[address] ?[address] ?[place] [address] ?[phone] ? ? ?  ?  ? ? Jearld Fenton, NP. Schedule an appointment as soon as possible for a visit in 1 week(s).   ?Specialties: Internal Medicine, Emergency Medicine ?Why: hospital f/u ?Contact information: ?[address] [place] [address] ?[phone] ? ? ?  ?  ? ?  ?  ? ?  ? ?Discharge Exam: ?Filed Weights  ? 03/13/22 2359 03/15/22 1244  ?Weight: 104.3 kg 104.3 kg  ? ? ? ?Condition at discharge: fair ? ?The results of significant diagnostics from this hospitalization (including imaging, microbiology, ancillary and laboratory) are  listed below for reference.  ? ?Imaging Studies: ?MR 3D Recon At Scanner ? ?Result Date: 03/14/2022 ?CLINICAL DATA:  51 year old female with history of upper abdominal pain since yesterday. EXAM: MRI ABDOMEN WITHOUT AND WITH CONTRAST (INCLUDING MRCP) TECHNIQUE: Multiplanar multisequence MR imaging of the abdomen was performed both before and after the administration of intravenous contrast. Heavily T2-weighted images of the biliary and pancreatic ducts were obtained, and three-dimensional MRCP images were rendered by post processing. CONTRAST:  27m GADAVIST GADOBUTROL 1 MMOL/ML IV SOLN COMPARISON:  Abdominal ultrasound 03/14/2022. CT the abdomen and pelvis 01/01/2021. FINDINGS: Lower chest: Unremarkable. Hepatobiliary: Mild diffuse loss of signal intensity throughout the hepatic parenchyma on out of phase dual echo images, indicative of a background of very mild hepatic steatosis. No suspicious cystic or solid hepatic lesions. No intrahepatic biliary ductal dilatation. MRCP images are slightly limited by motion. With these limitations in mind, common bile duct measures 7 mm in the porta hepatis (mildly dilated). In the distal common bile duct there is some amorphous T1 hyperintensity, and there is a suggestion of some tiny 1-2 mm filling defects in the distal common bile duct on coronal T2 weighted images (series 2), concerning for potential choledocholithiasis. Multiple filling defects are noted in the gallbladder measuring up to 2.9 cm in the fundus, compatible with gallstones. Gallbladder is moderately distended. Gallbladder wall thickness is normal. No pericholecystic fluid or surrounding inflammatory changes. Pancreas: In the pancreas at the junction of the head and body (axial image 24 of series 3 and coronal image 21 of series 2) there is a 2.2 x 1.3 x 1.5 cm T1 hypointense, T2 hyperintense lesion which is intimately associated with the main pancreatic duct without definite direct ductal communication  confidently identified, and without definite internal enhancement on post gadolinium imaging. This lesion does not restrict diffusion. No other solid appearing pancreatic mass. No pancreatic ductal dilatation. No peripancreatic fluid collections or inflammatory changes. Spleen: Small splenule adjacent to the splenic hilum. Otherwise, unremarkable. Adrenals/Urinary Tract: Bilateral kidneys and adrenal glands are normal in appearance. No hydroureteronephrosis in the visualized portions of the abdomen. Stomach/Bowel: Visualized portions are unremarkable. Vascular/Lymphatic: No aneurysm identified in the visualized abdominal vasculature. No lymphadenopathy noted in the abdomen. Other: No significant volume of ascites noted in the visualized portions of the peritoneal cavity. Musculoskeletal: No aggressive appearing osseous lesions are noted in the visualized portions of the skeleton. IMPRESSION: 1. Findings are concerning for (but not definitive for) potential choledocholithiasis and/or biliary sludge in the distal common bile duct. At this time, this is associated with minimal common bile duct dilatation, but no frank intrahepatic biliary ductal dilatation. 2. Cholelithiasis without evidence of acute cholecystitis. 3. Mild hepatic steatosis. 4. Cystic lesion in the neck of the pancreas without definite direct communication with the main pancreatic duct measuring 2.2 x 1.3 x 1.5 cm. This is  favored to represent a benign lesions such as a small pancreatic pseudocyst or side branch IPMN (intraductal papillary mucinous neoplasm). However, repeat abdominal MRI with and without IV gadolinium with MRCP is recommended in 6 months to ensure the stability of this finding. Electronically Signed   By: Vinnie Langton M.D.   On: 03/14/2022 07:32  ? ?DG C-Arm 1-60 Min-No Report ? ?Result Date: 03/15/2022 ?Fluoroscopy was utilized by the requesting physician.  No radiographic interpretation.  ? ?MR ABDOMEN MRCP W WO CONTAST ? ?Result  Date: 03/14/2022 ?CLINICAL DATA:  52 year old female with history of upper abdominal pain since yesterday. EXAM: MRI ABDOMEN WITHOUT AND WITH CONTRAST (INCLUDING MRCP) TECHNIQUE: Multiplanar multisequence MR imagi

## 2022-03-18 ENCOUNTER — Telehealth: Payer: Federal, State, Local not specified - PPO | Admitting: Family

## 2022-03-18 DIAGNOSIS — R58 Hemorrhage, not elsewhere classified: Secondary | ICD-10-CM

## 2022-03-18 DIAGNOSIS — Z9049 Acquired absence of other specified parts of digestive tract: Secondary | ICD-10-CM

## 2022-03-18 NOTE — Progress Notes (Signed)
?Virtual Visit Consent  ? ?Anne Mcguire, you are scheduled for a virtual visit with a Anne Mcguire provider today.   ?  ?Just as with appointments in the office, your consent must be obtained to participate.  Your consent will be active for this visit and any virtual visit you may have with one of our providers in the next 365 days.   ?  ?If you have a MyChart account, a copy of this consent can be sent to you electronically.  All virtual visits are billed to your insurance company just like a traditional visit in the office.   ? ?As this is a virtual visit, video technology does not allow for your provider to perform a traditional examination.  This may limit your provider's ability to fully assess your condition.  If your provider identifies any concerns that need to be evaluated in person or the need to arrange testing (such as labs, EKG, etc.), we will make arrangements to do so.   ?  ?Although advances in technology are sophisticated, we cannot ensure that it will always work on either your end or our end.  If the connection with a video visit is poor, the visit may have to be switched to a telephone visit.  With either a video or telephone visit, we are not always able to ensure that we have a secure connection.    ? ?I need to obtain your verbal consent now.   Are you willing to proceed with your visit today?  ?  ?Anne Mcguire has provided verbal consent on 03/18/2022 for a virtual visit (video or telephone). ?  ?Evelina Dun, FNP  ? ?Date: 03/18/2022 10:03 AM ? ? ?Virtual Visit via Video Note  ? ?Anne Mcguire, connected with  Anne Mcguire  ([id number], [birth year]) on 03/18/22 at  9:45 AM EDT by a video-enabled telemedicine application and verified that I am speaking with the correct person using two identifiers. ? ?Location: ?Patient: Virtual Visit Location Patient: Home ?Provider: Virtual Visit Location Provider: Home Office ?  ?I discussed the limitations of evaluation and management by  telemedicine and the availability of in person appointments. The patient expressed understanding and agreed to proceed.   ? ?History of Present Illness: ?Anne Mcguire is a 51 y.o. who identifies as a female who was assigned female at birth, and is being seen today for bruising after cholecystectomy. Reports she had robotic assisted lap cholecystectomy on 03/15/22. Denies any bleeding or increased pain. Reports her pain is a 0 sitting and mildly increased when standing. She reports the bruising area seems to have worsen and was worried this was not normal.  ? ?HPI: HPI  ?Problems:  ?Patient Active Problem List  ? Diagnosis Date Noted  ? Abdominal pain 03/15/2022  ? Choledocholithiasis 03/14/2022  ? UTI (urinary tract infection) 03/14/2022  ? Iron deficiency anemia due to chronic blood loss 09/27/2021  ? Prediabetes 09/27/2021  ? Morbid obesity ([place]) 09/27/2021  ? Severe sleep apnea 01/18/2021  ? HLD (hyperlipidemia) 08/26/2017  ? Migraines 08/26/2017  ? Essential hypertension 01/28/2017  ? Insomnia 04/02/2014  ? Anxiety and depression 02/19/2014  ? Hypothyroidism 02/19/2014  ? GERD (gastroesophageal reflux disease) 02/19/2014  ?  ?Allergies:  ?Allergies  ?Allergen Reactions  ? Azithromycin Hives  ? ?Medications:  ?Current Outpatient Medications:  ?  atorvastatin (LIPITOR) 10 MG tablet, TAKE 1 TABLET BY MOUTH ONCE DAILY, Disp: 90 tablet, Rfl: 3 ?  buPROPion (WELLBUTRIN SR) 150 MG 12 hr tablet,  TAKE 1 TABLET BY MOUTH ONCE DAILY, Disp: 90 tablet, Rfl: 0 ?  FLUoxetine (PROZAC) 40 MG capsule, Take 1 capsule (40 mg total) by mouth daily., Disp: 90 capsule, Rfl: 1 ?  HYDROcodone-acetaminophen (NORCO/VICODIN) 5-325 MG tablet, Take 1-2 tablets by mouth 3 (three) times daily as needed for moderate pain., Disp: 15 tablet, Rfl: 0 ?  hydrOXYzine (ATARAX) 10 MG tablet, TAKE 1 TABLET BY MOUTH ONCE DAILY AS NEEDED, Disp: 30 tablet, Rfl: 0 ?  levothyroxine (SYNTHROID) 100 MCG tablet, TAKE 1 TABLET BY MOUTH EVERY MORNING WITH  BREAKFAST, Disp: 90 tablet, Rfl: 2 ?  lisinopril-hydrochlorothiazide (ZESTORETIC) 20-12.5 MG tablet, TAKE 1 TABLET BY MOUTH ONCE A DAY, Disp: 90 tablet, Rfl: 1 ?  omeprazole (PRILOSEC) 20 MG capsule, TAKE 1 CAPSULE BY MOUTH ONCE DAILY, Disp: 90 capsule, Rfl: 0 ? ?Observations/Objective: ?Patient is well-developed, well-nourished in no acute distress.  ?Resting comfortably at home.  ?Head is normocephalic, atraumatic.  ?No labored breathing. ?Speech is clear and coherent with logical content.  ?Patient is alert and oriented at baseline.  ?Large ecchymosis of RLQ.  ?Trocar sites well approximated and no s/s of infection noted   ? ?Assessment and Plan: ?1. History of cholecystectomy ? ?2. Ecchymosis ? ?Reassurance given ?Keep surgery follow up  ?Report any fevers, redness, or increased pain ?Rest as needed  ?Encourage walking ? ?Follow Up Instructions: ?I discussed the assessment and treatment plan with the patient. The patient was provided an opportunity to ask questions and all were answered. The patient agreed with the plan and demonstrated an understanding of the instructions.  A copy of instructions were sent to the patient via MyChart unless otherwise noted below.  ? ? ? ?The patient was advised to call back or seek an in-person evaluation if the symptoms worsen or if the condition fails to improve as anticipated. ? ?Time:  ?I spent 13 minutes with the patient via telehealth technology discussing the above problems/concerns.   ? ?Evelina Dun, FNP ? ?

## 2022-03-19 ENCOUNTER — Encounter: Payer: Self-pay | Admitting: Gastroenterology

## 2022-03-19 LAB — SURGICAL PATHOLOGY

## 2022-03-20 ENCOUNTER — Other Ambulatory Visit: Payer: Self-pay

## 2022-03-20 ENCOUNTER — Ambulatory Visit: Payer: Self-pay | Admitting: *Deleted

## 2022-03-20 NOTE — Addendum Note (Signed)
Addendum  created 03/20/22 1036 by Doreen Salvage, CRNA  ? Attached Procedures edited  ?  ?

## 2022-03-20 NOTE — Telephone Encounter (Signed)
Reason for Disposition ? [1] Started on anti-anxiety medication AND [2] no relief ?   Anti anxiety medication increased but not helping since having surgery last week. ? ?Answer Assessment - Initial Assessment Questions ?1. CONCERN: "Did anything happen that prompted you to call today?"  ?    I had surgery last week and my anxiety is really bad.   They gave me trazodone in the hospital which helped. ?I did a phone call with nurse from [facility] on Sunday but she did not seem concerned. ?I feel I need different medication for the anxiety.    I'm taking hydroxyzine and Prozac.    ?2. ANXIETY SYMPTOMS: "Can you describe how you (your loved one; patient) have been feeling?" (e.g., tense, restless, panicky, anxious, keyed up, overwhelmed, sense of impending doom).  ?    The surgery has kicked up my anxiety.   ? ?My surgical incisions are fine.   I'm just bruised really bad.   I had gallbladder surgery and they cleaned out sludge from the ducts.    Not on blood thinners.   Not hard feeling around the holes where they put in the scope.   ? ?3. ONSET: "How long have you been feeling this way?" (e.g., hours, days, weeks) ?    Since last week.   I was having anxiety before the surgery.   My medicine was increased but it's not helping. ?4. SEVERITY: "How would you rate the level of anxiety?" (e.g., 0 - 10; or mild, moderate, severe). ?    Getting worse over last couple of weeks. ?5. FUNCTIONAL IMPAIRMENT: "How have these feelings affected your ability to do daily activities?" "Have you had more difficulty than usual doing your normal daily activities?" (e.g., getting better, same, worse; self-care, school, work, interactions) ?    Yes ?6. HISTORY: "Have you felt this way before?" "Have you ever been diagnosed with an anxiety problem in the past?" (e.g., generalized anxiety disorder, panic attacks, PTSD). If Yes, ask: "How was this problem treated?" (e.g., medicines, counseling, etc.) ?    Yes ?7. RISK OF HARM - SUICIDAL  IDEATION: "Do you ever have thoughts of hurting or killing yourself?" If Yes, ask:  "Do you have these feelings now?" "Do you have a plan on how you would do this?" ?    No thoughts of harming herself when asked. ?8. TREATMENT:  "What has been done so far to treat this anxiety?" (e.g., medicines, relaxation strategies). "What has helped?" ?    Taking my medication but it's not helping much ?9. TREATMENT - THERAPIST: "Do you have a counselor or therapist? Name?" ?    Not asked ?10. POTENTIAL TRIGGERS: "Do you drink caffeinated beverages (e.g., coffee, colas, teas), and how much daily?" "Do you drink alcohol or use any drugs?" "Have you started any new medicines recently?" ?    Not asked ?10. PATIENT SUPPORT: "Who is with you now?" "Who do you live with?" "Do you have family or friends who you can talk to?"  ?      Not asked ?11. OTHER SYMPTOMS: "Do you have any other symptoms?" (e.g., feeling depressed, trouble concentrating, trouble sleeping, trouble breathing, palpitations or fast heartbeat, chest pain, sweating, nausea, or diarrhea) ?      Concerned about the bruising she is having around the surgical site. ?12. PREGNANCY: "Is there any chance you are pregnant?" "When was your last menstrual period?" ?      Not asked ? ?Protocols used: Anxiety and Panic  Attack-A-AH ? ?

## 2022-03-20 NOTE — Telephone Encounter (Signed)
?  Chief Complaint: increasing anxiety since having gallbladder surgery last week.    ?Symptoms: Anxiety not better with increased bupropion and since having surgery ?Frequency: since gallbladder surgery last week.    Was having more anxiety prior to surgery which is when the anxiety med was increased. ?Pertinent Negatives: Patient denies suicidal thoughts ?Disposition: '[]'$ ED /'[]'$ Urgent Care (no appt availability in office) / '[x]'$ Appointment(In office/virtual)/ '[]'$  [facility]/ '[]'$ Home Care/ '[]'$ Refused Recommended Disposition /'[]'$ [facility]/ '[]'$  Follow-up with PCP ?Additional Notes: Given the 58 national hotline number.  ?

## 2022-03-22 ENCOUNTER — Ambulatory Visit: Payer: Federal, State, Local not specified - PPO | Admitting: Internal Medicine

## 2022-03-22 ENCOUNTER — Encounter: Payer: Self-pay | Admitting: Internal Medicine

## 2022-03-22 VITALS — BP 126/86 | HR 93 | Temp 97.8°F | Wt 236.0 lb

## 2022-03-22 DIAGNOSIS — F32A Depression, unspecified: Secondary | ICD-10-CM

## 2022-03-22 DIAGNOSIS — F419 Anxiety disorder, unspecified: Secondary | ICD-10-CM | POA: Diagnosis not present

## 2022-03-22 DIAGNOSIS — K8000 Calculus of gallbladder with acute cholecystitis without obstruction: Secondary | ICD-10-CM | POA: Diagnosis not present

## 2022-03-22 DIAGNOSIS — Z9049 Acquired absence of other specified parts of digestive tract: Secondary | ICD-10-CM

## 2022-03-22 DIAGNOSIS — R1011 Right upper quadrant pain: Secondary | ICD-10-CM | POA: Diagnosis not present

## 2022-03-22 DIAGNOSIS — K8050 Calculus of bile duct without cholangitis or cholecystitis without obstruction: Secondary | ICD-10-CM

## 2022-03-22 MED ORDER — BUSPIRONE HCL 10 MG PO TABS: 10.0000 mg | ORAL_TABLET | Freq: Three times a day (TID) | ORAL | 1 refills | Status: DC

## 2022-03-22 NOTE — Progress Notes (Signed)
? ?Subjective:  ? ? Patient ID: Anne Mcguire, female    DOB: 07-30-1971, 51 y.o.   MRN: [id number] ? ?HPI ? ?Patient presents to clinic today for hospital follow up.  She presented to the ED 4/5 with complaint of upper abdominal pain that had worsened over the last few days.  RUQ abdominal ultrasound showed: ? ?IMPRESSION: ?1. Cholelithiasis without evidence of acute cholecystitis at this time. ?2. Very mild dilatation of the common bile duct (7.2 mm). The possibility of choledocholithiasis is not excluded. If there is clinical concern for biliary tract obstruction from choledocholithiasis or obstructing pancreatic head mass, further evaluation with abdominal MRI with and without IV gadolinium with ?MRCP would be strongly recommended at this time. ?3. Hepatic steatosis.  ? ?She underwent MRCP which showed: ? ?IMPRESSION: ?1. Findings are concerning for (but not definitive for) potential choledocholithiasis and/or biliary sludge in the distal common bile duct. At this time, this is associated with minimal common bile duct ?dilatation, but no frank intrahepatic biliary ductal dilatation. ?2. Cholelithiasis without evidence of acute cholecystitis. ?3. Mild hepatic steatosis. ?4. Cystic lesion in the neck of the pancreas without definite direct communication with the main pancreatic duct measuring 2.2 x 1.3 x 1.5 cm. This is favored to represent a benign lesions such as a ?small pancreatic pseudocyst or side branch IPMN (intraductal papillary mucinous neoplasm). However, repeat abdominal MRI with and without IV gadolinium with MRCP is recommended in 6 months to ensure the stability of this finding. ? ?She underwent ERCP with sphincterectomy with balloon sweep sludge removal and robotic lap cholecystectomy on 03/16/2022.  She was discharged on 4/7, advised to follow-up with her PCP.  Since discharge, her biggest complaint is increased anxiety. She has a history of anxiety and depression, currently managed on  Fluoxetine, Bupropion and Hydroxyzine. Prior to hospitalization, we went up to Bupropion to 150 mg BID. She does not feel like is very effective. She is not currently seeing a therapist.  She denies SI/HI. She has been having some slight abdominal pain and constipation. She is taking Dulcolax and Mirilax with some relief of symptoms. She does not have a follow up with her surgeon scheduled yet. ? ?Review of Systems ? ?   ?Past Medical History:  ?Diagnosis Date  ? Anxiety 06/01/2021  ? COVID 12/2020  ? headache, fever, stuffy nose, loss of taste and smell x few weeks all sx resolved except taste is altered  ? Depression 06/01/2021  ? Gastric ulcer   ? yrs ago no current problems per pt on 06-01-2021  ? GERD (gastroesophageal reflux disease) 06/01/2021  ? Hyperlipidemia 06/01/2021  ? Hypertension 06/01/2021  ? Hypothyroidism 06/01/2021  ? Menorrhagia 06/01/2021  ? Migraines 06/01/2021  ? regulae headaches also @ times  ? Nocturia 06/01/2021  ? Plantar fasciitis 06/01/2021  ? both feet right worse than left  ? Sleep apnea 06/01/2021  ? cpap severe osa does not know cpap settings  ? SUI (stress urinary incontinence, female) 06/01/2021  ? Wears glasses 06/01/2021  ? ? ?Current Outpatient Medications  ?Medication Sig Dispense Refill  ? atorvastatin (LIPITOR) 10 MG tablet TAKE 1 TABLET BY MOUTH ONCE DAILY 90 tablet 3  ? buPROPion (WELLBUTRIN SR) 150 MG 12 hr tablet TAKE 1 TABLET BY MOUTH ONCE DAILY 90 tablet 0  ? FLUoxetine (PROZAC) 40 MG capsule Take 1 capsule (40 mg total) by mouth daily. 90 capsule 1  ? HYDROcodone-acetaminophen (NORCO/VICODIN) 5-325 MG tablet Take 1-2 tablets by mouth 3 (three) times daily  as needed for moderate pain. 15 tablet 0  ? hydrOXYzine (ATARAX) 10 MG tablet TAKE 1 TABLET BY MOUTH ONCE DAILY AS NEEDED 30 tablet 0  ? levothyroxine (SYNTHROID) 100 MCG tablet TAKE 1 TABLET BY MOUTH EVERY MORNING WITH BREAKFAST 90 tablet 2  ? lisinopril-hydrochlorothiazide (ZESTORETIC) 20-12.5 MG tablet TAKE 1  TABLET BY MOUTH ONCE A DAY 90 tablet 1  ? omeprazole (PRILOSEC) 20 MG capsule TAKE 1 CAPSULE BY MOUTH ONCE DAILY 90 capsule 0  ? ?No current facility-administered medications for this visit.  ? ? ?Allergies  ?Allergen Reactions  ? Azithromycin Hives  ? ? ?Family History  ?Problem Relation Age of Onset  ? Mental illness Mother   ? Arthritis Father   ? Hyperlipidemia Father   ? Hypertension Father   ? Diabetes Father   ? Arthritis Maternal Grandmother   ? Diabetes Maternal Grandmother   ? Arthritis Maternal Grandfather   ? Diabetes Maternal Grandfather   ? Arthritis Paternal Grandmother   ? Breast cancer Paternal Grandmother   ? ? ?Social History  ? ?Socioeconomic History  ? Marital status: Married  ?  Spouse name: Not on file  ? Number of children: Not on file  ? Years of education: Not on file  ? Highest education level: Not on file  ?Occupational History  ? Not on file  ?Tobacco Use  ? Smoking status: Never  ? Smokeless tobacco: Never  ?Vaping Use  ? Vaping Use: Never used  ?Substance and Sexual Activity  ? Alcohol use: Yes  ?  Comment: occasional  ? Drug use: No  ? Sexual activity: Yes  ?Other Topics Concern  ? Not on file  ?Social History Narrative  ? Not on file  ? ?Social Determinants of Health  ? ?Financial Resource Strain: Not on file  ?Food Insecurity: Not on file  ?Transportation Needs: Not on file  ?Physical Activity: Not on file  ?Stress: Not on file  ?Social Connections: Not on file  ?Intimate Partner Violence: Not on file  ? ? ? ?Constitutional: Denies fever, malaise, fatigue, headache or abrupt weight changes.  ?Respiratory: Denies difficulty breathing, shortness of breath, cough or sputum production.   ?Cardiovascular: Denies chest pain, chest tightness, palpitations or swelling in the hands or feet.  ?Gastrointestinal: Patient reports abdominal pain, constipation. Denies bloating, diarrhea or blood in the stool.  ?Musculoskeletal: Denies decrease in range of motion, difficulty with gait, muscle pain  or joint pain and swelling.  ?Skin: Patient reports lap sites with bruising around them.  Denies redness, rashes, lesions or ulcercations.  ?Psych: Patient reports anxiety and depression.  Denies SI/HI. ? ?No other specific complaints in a complete review of systems (except as listed in HPI above). ? ?Objective:  ? Physical Exam ?BP 126/86 (BP Location: Left Arm, Patient Position: Sitting, Cuff Size: Large)   Pulse 93   Temp 97.8 ?F (36.6 ?C) (Temporal)   Wt 236 lb (107 kg)   LMP 05/15/2021   SpO2 95%   BMI 40.51 kg/m?  ? ?Wt Readings from Last 3 Encounters:  ?03/15/22 230 lb (104.3 kg)  ?12/15/21 252 lb (114.3 kg)  ?09/27/21 251 lb 12.8 oz (114.2 kg)  ? ? ?General: Appears her stated age, obese, in NAD. ?Skin: 4 lap sites in the lower abdomen with surgical glue intact, healing well.  No drainage or surrounding redness noted.  Bruising noted of the lower abdomen.  Left side of the left upper abdomen with surgical glue intact, healing well.  No drainage, redness  or bruising noted. ?Cardiovascular: Normal rate and rhythm. S1,S2 noted.  No murmur, rubs or gallops noted.  ?Pulmonary/Chest: Normal effort and positive vesicular breath sounds. No respiratory distress. No wheezes, rales or ronchi noted.  ?Abdomen: Soft and tender in the RUQ and around lap sites.. Normal bowel sounds.  ?Musculoskeletal: No difficulty with gait.  ?Neurological: Alert and oriented.  ?Psychiatric: Mood and affect normal.  Anxious appearing. Judgment and thought content normal.  ? ? ?BMET ?   ?Component Value Date/Time  ? NA 138 03/14/2022 1320  ? K 3.4 (L) 03/14/2022 1320  ? CL 104 03/14/2022 1320  ? CO2 27 03/14/2022 1320  ? GLUCOSE 109 (H) 03/14/2022 1320  ? BUN 15 03/14/2022 1320  ? CREATININE 0.70 03/14/2022 1320  ? CREATININE 0.77 09/27/2021 0850  ? CALCIUM 8.3 (L) 03/14/2022 1320  ? GFRNONAA >60 03/14/2022 1320  ? ? ?Lipid Panel  ?   ?Component Value Date/Time  ? CHOL 165 09/27/2021 0850  ? TRIG 162 (H) 09/27/2021 0850  ? HDL 50  09/27/2021 0850  ? CHOLHDL 3.3 09/27/2021 0850  ? VLDL 21.4 03/25/2020 0749  ? [place] 89 09/27/2021 0850  ? ? ?CBC ?   ?Component Value Date/Time  ? WBC 9.7 03/15/2022 0358  ? RBC 4.06 03/15/2022 0358  ? HGB 11.9 (L) 04/06

## 2022-03-22 NOTE — Patient Instructions (Signed)

## 2022-03-22 NOTE — Assessment & Plan Note (Signed)
Deteriorated ?Continue Fluoxetine at current dose ?We will decrease Bupropion back to 150 mg daily ?We will have her take Hydroxyzine at bedtime nightly ?Rx for BuSpar 10 mg 3 times daily as needed ?Discussed referral to therapy but she is not interested in seeing antibody virtually ?Support offered ? ?

## 2022-03-26 ENCOUNTER — Encounter: Payer: Self-pay | Admitting: Internal Medicine

## 2022-03-28 ENCOUNTER — Encounter: Payer: Self-pay | Admitting: Internal Medicine

## 2022-03-28 ENCOUNTER — Ambulatory Visit (INDEPENDENT_AMBULATORY_CARE_PROVIDER_SITE_OTHER): Payer: Federal, State, Local not specified - PPO | Admitting: Internal Medicine

## 2022-03-28 VITALS — BP 122/80 | HR 77 | Temp 97.3°F | Ht 64.0 in | Wt 235.0 lb

## 2022-03-28 DIAGNOSIS — Z0001 Encounter for general adult medical examination with abnormal findings: Secondary | ICD-10-CM

## 2022-03-28 DIAGNOSIS — Z23 Encounter for immunization: Secondary | ICD-10-CM

## 2022-03-28 DIAGNOSIS — R7303 Prediabetes: Secondary | ICD-10-CM

## 2022-03-28 DIAGNOSIS — Z1231 Encounter for screening mammogram for malignant neoplasm of breast: Secondary | ICD-10-CM

## 2022-03-28 DIAGNOSIS — Z1211 Encounter for screening for malignant neoplasm of colon: Secondary | ICD-10-CM

## 2022-03-28 MED ORDER — OZEMPIC (0.25 OR 0.5 MG/DOSE) 2 MG/1.5ML ~~LOC~~ SOPN: 0.2500 mg | PEN_INJECTOR | SUBCUTANEOUS | 0 refills | Status: DC

## 2022-03-28 NOTE — Assessment & Plan Note (Signed)
Encourage low-carb diet and exercise for weight loss ?We will trial Ozempic, sample given today ?

## 2022-03-28 NOTE — Addendum Note (Signed)
Addended by: Ashley Royalty E on: 03/28/2022 11:55 AM ? ? Modules accepted: Orders ? ?

## 2022-03-28 NOTE — Patient Instructions (Signed)

## 2022-03-28 NOTE — Progress Notes (Signed)
? ?Subjective:  ? ? Patient ID: Anne Mcguire, female    DOB: Dec 27, 1970, 51 y.o.   MRN: [id number] ? ?HPI ? ?Patient presents to clinic today for her annual exam. ? ?Flu: 09/2021 ?Tetanus: 09/2012 ?COVID: Pfizer x2 ?Shingrix: Never ?Pap smear: 01/2016, hysterectomy ?Mammogram: 08/2018 ?Colon screening: never ?Vision screening: annually ?Dentist: biannually ? ?Diet: She does eat meat. She consumes fruits and veggies. She does eat some fried foods. She drinks mostly coffee, tea. ?Exercise: None ? ?Review of Systems ? ?   ?Past Medical History:  ?Diagnosis Date  ? Anxiety 06/01/2021  ? COVID 12/2020  ? headache, fever, stuffy nose, loss of taste and smell x few weeks all sx resolved except taste is altered  ? Depression 06/01/2021  ? Gastric ulcer   ? yrs ago no current problems per pt on 06-01-2021  ? GERD (gastroesophageal reflux disease) 06/01/2021  ? Hyperlipidemia 06/01/2021  ? Hypertension 06/01/2021  ? Hypothyroidism 06/01/2021  ? Menorrhagia 06/01/2021  ? Migraines 06/01/2021  ? regulae headaches also @ times  ? Nocturia 06/01/2021  ? Plantar fasciitis 06/01/2021  ? both feet right worse than left  ? Sleep apnea 06/01/2021  ? cpap severe osa does not know cpap settings  ? SUI (stress urinary incontinence, female) 06/01/2021  ? Wears glasses 06/01/2021  ? ? ?Current Outpatient Medications  ?Medication Sig Dispense Refill  ? atorvastatin (LIPITOR) 10 MG tablet TAKE 1 TABLET BY MOUTH ONCE DAILY 90 tablet 3  ? buPROPion (WELLBUTRIN SR) 150 MG 12 hr tablet TAKE 1 TABLET BY MOUTH ONCE DAILY 90 tablet 0  ? busPIRone (BUSPAR) 10 MG tablet Take 1 tablet (10 mg total) by mouth 3 (three) times daily. 90 tablet 1  ? FLUoxetine (PROZAC) 40 MG capsule Take 1 capsule (40 mg total) by mouth daily. 90 capsule 1  ? HYDROcodone-acetaminophen (NORCO/VICODIN) 5-325 MG tablet Take 1-2 tablets by mouth 3 (three) times daily as needed for moderate pain. 15 tablet 0  ? hydrOXYzine (ATARAX) 10 MG tablet TAKE 1 TABLET BY MOUTH ONCE  DAILY AS NEEDED 30 tablet 0  ? levothyroxine (SYNTHROID) 100 MCG tablet TAKE 1 TABLET BY MOUTH EVERY MORNING WITH BREAKFAST 90 tablet 2  ? lisinopril-hydrochlorothiazide (ZESTORETIC) 20-12.5 MG tablet TAKE 1 TABLET BY MOUTH ONCE A DAY 90 tablet 1  ? omeprazole (PRILOSEC) 20 MG capsule TAKE 1 CAPSULE BY MOUTH ONCE DAILY 90 capsule 0  ? ?No current facility-administered medications for this visit.  ? ? ?Allergies  ?Allergen Reactions  ? Azithromycin Hives  ? ? ?Family History  ?Problem Relation Age of Onset  ? Mental illness Mother   ? Arthritis Father   ? Hyperlipidemia Father   ? Hypertension Father   ? Diabetes Father   ? Arthritis Maternal Grandmother   ? Diabetes Maternal Grandmother   ? Arthritis Maternal Grandfather   ? Diabetes Maternal Grandfather   ? Arthritis Paternal Grandmother   ? Breast cancer Paternal Grandmother   ? ? ?Social History  ? ?Socioeconomic History  ? Marital status: Married  ?  Spouse name: Not on file  ? Number of children: Not on file  ? Years of education: Not on file  ? Highest education level: Not on file  ?Occupational History  ? Not on file  ?Tobacco Use  ? Smoking status: Never  ? Smokeless tobacco: Never  ?Vaping Use  ? Vaping Use: Never used  ?Substance and Sexual Activity  ? Alcohol use: Yes  ?  Comment: occasional  ? Drug use:  No  ? Sexual activity: Yes  ?Other Topics Concern  ? Not on file  ?Social History Narrative  ? Not on file  ? ?Social Determinants of Health  ? ?Financial Resource Strain: Not on file  ?Food Insecurity: Not on file  ?Transportation Needs: Not on file  ?Physical Activity: Not on file  ?Stress: Not on file  ?Social Connections: Not on file  ?Intimate Partner Violence: Not on file  ? ? ? ?Constitutional: Patient reports headaches.  Denies fever, malaise, fatigue, or abrupt weight changes.  ?HEENT: Denies eye pain, eye redness, ear pain, ringing in the ears, wax buildup, runny nose, nasal congestion, bloody nose, or sore throat. ?Respiratory: Denies  difficulty breathing, shortness of breath, cough or sputum production.   ?Cardiovascular: Denies chest pain, chest tightness, palpitations or swelling in the hands or feet.  ?Gastrointestinal: Denies abdominal pain, bloating, constipation, diarrhea or blood in the stool.  ?GU: Denies urgency, frequency, pain with urination, burning sensation, blood in urine, odor or discharge. ?Musculoskeletal: Denies decrease in range of motion, difficulty with gait, muscle pain or joint pain and swelling.  ?Skin: Denies redness, rashes, lesions or ulcercations.  ?Neurological: Patient reports insomnia.  Denies dizziness, difficulty with memory, difficulty with speech or problems with balance and coordination.  ?Psych: Patient has a history of anxiety and depression.  Denies SI/HI. ? ?No other specific complaints in a complete review of systems (except as listed in HPI above). ? ?Objective:  ? Physical Exam ? ? ?BP 122/80 (BP Location: Left Arm, Patient Position: Sitting, Cuff Size: Large)   Pulse 77   Temp (!) 97.3 ?F (36.3 ?C) (Temporal)   Ht '5\' 4"'$  (1.626 m)   Wt 235 lb (106.6 kg)   LMP 05/15/2021   SpO2 99%   BMI 40.34 kg/m?  ? ?Wt Readings from Last 3 Encounters:  ?03/22/22 236 lb (107 kg)  ?03/15/22 230 lb (104.3 kg)  ?12/15/21 252 lb (114.3 kg)  ? ? ?General: Appears her stated age, obese, in NAD. ?Skin: Warm, dry and intact.  ?HEENT: Head: normal shape and size; Eyes: sclera white, no icterus, conjunctiva pink, PERRLA and EOMs intact;  ?Neck:  Neck supple, trachea midline. No masses, lumps or thyromegaly present.  ?Cardiovascular: Normal rate and rhythm. S1,S2 noted.  No murmur, rubs or gallops noted. No JVD or BLE edema. No carotid bruits noted. ?Pulmonary/Chest: Normal effort and positive vesicular breath sounds. No respiratory distress. No wheezes, rales or ronchi noted.  ?Abdomen: Soft and nontender. Normal bowel sounds. No distention or masses noted. Liver, spleen and kidneys non palpable. ?Musculoskeletal:  Strength 5/5 BUE/BLE.  No difficulty with gait.  ?Neurological: Alert and oriented. Cranial nerves II-XII grossly intact. Coordination normal.  ?Psychiatric: Mood and affect normal. Behavior is normal. Judgment and thought content normal.  ? ? ?BMET ?   ?Component Value Date/Time  ? NA 138 03/14/2022 1320  ? K 3.4 (L) 03/14/2022 1320  ? CL 104 03/14/2022 1320  ? CO2 27 03/14/2022 1320  ? GLUCOSE 109 (H) 03/14/2022 1320  ? BUN 15 03/14/2022 1320  ? CREATININE 0.70 03/14/2022 1320  ? CREATININE 0.77 09/27/2021 0850  ? CALCIUM 8.3 (L) 03/14/2022 1320  ? GFRNONAA >60 03/14/2022 1320  ? ? ?Lipid Panel  ?   ?Component Value Date/Time  ? CHOL 165 09/27/2021 0850  ? TRIG 162 (H) 09/27/2021 0850  ? HDL 50 09/27/2021 0850  ? CHOLHDL 3.3 09/27/2021 0850  ? VLDL 21.4 03/25/2020 0749  ? [place] 89 09/27/2021 0850  ? ? ?  CBC ?   ?Component Value Date/Time  ? WBC 9.7 03/15/2022 0358  ? RBC 4.06 03/15/2022 0358  ? HGB 11.9 (L) 03/15/2022 0358  ? HCT 37.4 03/15/2022 0358  ? PLT 151 03/15/2022 0358  ? MCV 92.1 03/15/2022 0358  ? MCH 29.3 03/15/2022 0358  ? MCHC 31.8 03/15/2022 0358  ? RDW 14.1 03/15/2022 0358  ? LYMPHSABS 1.5 03/14/2022 0002  ? MONOABS 0.7 03/14/2022 0002  ? EOSABS 0.1 03/14/2022 0002  ? BASOSABS 0.0 03/14/2022 0002  ? ? ?Hgb A1C ?Lab Results  ?Component Value Date  ? HGBA1C 6.2 (H) 09/27/2021  ? ? ? ? ? ? ?   ?Assessment & Plan:  ? ?Preventative Health Maintenance: ? ?Encouraged her to get a flu shot in the fall ?Tdap today ?Encouraged her to get her COVID booster ?Discussed Shingrix vaccine, she will check coverage with her insurance company and schedule a nurse visit if she would like to have this done ?She no longer needs Pap smears ?Mammogram ordered-she will call to schedule ?Cologuard ordered ?Encouraged her to consume a balanced diet and exercise regimen ?We will check lipid and A1c today ? ?RTC in 6 months, follow-up chronic conditions ?Webb Silversmith, NP ? ?

## 2022-03-29 ENCOUNTER — Encounter: Payer: Self-pay | Admitting: Gastroenterology

## 2022-03-29 LAB — HEMOGLOBIN A1C
Hgb A1c MFr Bld: 5.7 % of total Hgb — ABNORMAL HIGH (ref <5.7)
Mean Plasma Glucose: 117 mg/dL
eAG (mmol/L): 6.5 mmol/L

## 2022-03-29 LAB — LIPID PANEL
Cholesterol: 177 mg/dL (ref <200)
HDL: 57 mg/dL (ref >=50)
LDL Cholesterol (Calc): 95 mg/dL (calc)
Non-HDL Cholesterol (Calc): 120 mg/dL (calc) (ref <130)
Total CHOL/HDL Ratio: 3.1 (calc) (ref <5.0)
Triglycerides: 145 mg/dL (ref <150)

## 2022-03-30 ENCOUNTER — Encounter: Payer: Self-pay | Admitting: Internal Medicine

## 2022-03-30 DIAGNOSIS — G4733 Obstructive sleep apnea (adult) (pediatric): Secondary | ICD-10-CM | POA: Diagnosis not present

## 2022-04-10 ENCOUNTER — Ambulatory Visit (INDEPENDENT_AMBULATORY_CARE_PROVIDER_SITE_OTHER): Payer: Federal, State, Local not specified - PPO | Admitting: Physician Assistant

## 2022-04-10 ENCOUNTER — Encounter: Payer: Self-pay | Admitting: Physician Assistant

## 2022-04-10 VITALS — BP 137/85 | HR 82 | Temp 98.3°F | Ht 64.0 in | Wt 233.6 lb

## 2022-04-10 DIAGNOSIS — Z09 Encounter for follow-up examination after completed treatment for conditions other than malignant neoplasm: Secondary | ICD-10-CM

## 2022-04-10 DIAGNOSIS — K8010 Calculus of gallbladder with chronic cholecystitis without obstruction: Secondary | ICD-10-CM

## 2022-04-10 NOTE — Progress Notes (Signed)
[facility] ?POST-OP OFFICE VISIT ? ?04/10/2022 ? ?HPI: ?Anne Mcguire is a 51 y.o. female 28 days s/p ERCP and concurrent robotic assisted laparoscopic cholecystectomy for choledocholithiasis without cholecystitis ? ?She is doing well ?Interesting, she showed me pictures on her phone, she had developed a massive hematoma on her abdominal wall about 2-3 days post-op. This has since completely resolved ?Otherwise, no abdominal pain, nausea, emesis, or bowel changes ?No other issues with incisions ?No other complaints  ? ?Vital signs: ?BP 137/85   Pulse 82   Temp 98.3 ?F (36.8 ?C) (Oral)   Ht '5\' 4"'$  (1.626 m)   Wt 233 lb 9.6 oz (106 kg)   LMP 05/15/2021   SpO2 97%   BMI 40.10 kg/m?   ? ?Physical Exam: ?Constitutional: Well appearing female, NAD ?Abdomen: Soft, non-tender, non-distended, no rebound/guarding ?Skin: Laparoscopic incisions are healing well, no erythema or drainage  ? ?Assessment/Plan: ?This is a 51 y.o. female 28 days s/p ERCP and concurrent robotic assisted laparoscopic cholecystectomy for choledocholithiasis without cholecystitis ? ? - Pain control prn ? - Reviewed wound care recommendation ? - She has completed her lifting restrictions ? - Reviewed surgical pathology; [place] ? - She can follow up on as needed basis; She understands to call with questions/concerns ? ?-- ?Edison Simon, PA-C ?[facility] ?04/10/2022, 2:44 PM ?M-F: 7am - 4pm ? ?

## 2022-04-10 NOTE — Patient Instructions (Signed)
If you have any concerns or questions, please feel free to call our office.  ? ?Gallbladder Eating Plan ? ?High blood cholesterol, obesity, a sedentary lifestyle, an unhealthy diet, and diabetes are risk factors for developing gallstones. ?If you have a gallbladder condition, you may have trouble digesting fats and tolerating high fat intake. Eating a low-fat diet can help reduce your symptoms and may be helpful before and after having surgery to remove your gallbladder (cholecystectomy). Your health care provider may recommend that you work with a dietitian to help you reduce the amount of fat in your diet. ?What are tips for following this plan? ?General guidelines ?Limit your fat intake to less than 30% of your total daily calories. If you eat around 1,800 calories each day, this means eating less than 60 grams (g) of fat per day. ?Fat is an important part of a healthy diet. Eating a low-fat diet can make it hard to maintain a healthy body weight. Ask your dietitian how much fat, calories, and other nutrients you need each day. ?Eat small, frequent meals throughout the day instead of three large meals. ?Drink at least 8-10 cups (1.9-2.4 L) of fluid a day. Drink enough fluid to keep your urine pale yellow. ?If you drink alcohol: ?Limit how much you have to: ?0-1 drink a day for women who are not pregnant. ?0-2 drinks a day for men. ?Know how much alcohol is in a drink. In the [place] one drink equals one 12 oz bottle of beer (355 mL), one 5 oz glass of wine (148 mL), or one 1? oz glass of hard liquor (44 mL). ?Reading food labels ? ?Check nutrition facts on food labels for the amount of fat per serving. Choose foods with less than 3 grams of fat per serving. ?Shopping ?Choose nonfat and low-fat healthy foods. Look for the words "nonfat," "low-fat," or "fat-free." ?Avoid buying processed or prepackaged foods. ?Cooking ?Cook using low-fat methods, such as baking, broiling, grilling, or boiling. ?Cook with small  amounts of healthy fats, such as olive oil, grapeseed oil, canola oil, avocado oil, or sunflower oil. ?What foods are recommended? ? ?All fresh, frozen, or canned fruits and vegetables. ?Whole grains. ?Low-fat or nonfat (skim) milk and yogurt. ?Lean meat, skinless poultry, fish, eggs, and beans. ?Low-fat protein supplement powders or drinks. ?Spices and herbs. ?The items listed above may not be a complete list of foods and beverages you can eat and drink. Contact a dietitian for more information. ?What foods are not recommended? ?High-fat foods. These include baked goods, fast food, fatty cuts of meat, ice cream, french toast, sweet rolls, pizza, cheese bread, foods covered with butter, creamy sauces, or cheese. ?Fried foods. These include french fries, tempura, battered fish, breaded chicken, fried breads, and sweets. ?Foods that cause bloating and gas. ?The items listed above may not be a complete list of foods that you should avoid. Contact a dietitian for more information. ?Summary ?A low-fat diet can be helpful if you have a gallbladder condition, or before and after gallbladder surgery. ?Limit your fat intake to less than 30% of your total daily calories. This is about 60 g of fat if you eat 1,800 calories each day. ?Eat small, frequent meals throughout the day instead of three large meals. ?This information is not intended to replace advice given to you by your health care provider. Make sure you discuss any questions you have with your health care provider. ?Document Revised: 11/10/2021 Document Reviewed: 11/10/2021 ?Elsevier Patient Education ? 2023 Elsevier  Inc. ? ?

## 2022-04-16 ENCOUNTER — Encounter: Payer: Self-pay | Admitting: Surgery

## 2022-04-17 ENCOUNTER — Other Ambulatory Visit: Payer: Self-pay | Admitting: Internal Medicine

## 2022-04-17 DIAGNOSIS — F419 Anxiety disorder, unspecified: Secondary | ICD-10-CM

## 2022-04-18 NOTE — Telephone Encounter (Signed)
Requested Prescriptions  ?Pending Prescriptions Disp Refills  ?? buPROPion (WELLBUTRIN SR) 150 MG 12 hr tablet [Pharmacy Med Name: BUPROPION HCL ER (SR) 150 MG TAB] 90 tablet 0  ?  Sig: TAKE 1 TABLET BY MOUTH ONCE DAILY  ?  ? Psychiatry: Antidepressants - bupropion Passed - 04/17/2022 12:08 PM  ?  ?  Passed - Cr in normal range and within 360 days  ?  Creat  ?Date Value Ref Range Status  ?09/27/2021 0.77 0.50 - 0.99 mg/dL Final  ? ?Creatinine, Ser  ?Date Value Ref Range Status  ?03/14/2022 0.70 0.44 - 1.00 mg/dL Final  ?   ?  ?  Passed - AST in normal range and within 360 days  ?  AST  ?Date Value Ref Range Status  ?03/15/2022 15 15 - 41 U/L Final  ?   ?  ?  Passed - ALT in normal range and within 360 days  ?  ALT  ?Date Value Ref Range Status  ?03/15/2022 14 0 - 44 U/L Final  ?   ?  ?  Passed - Completed PHQ-2 or PHQ-9 in the last 360 days  ?  ?  Passed - Last BP in normal range  ?  BP Readings from Last 1 Encounters:  ?04/10/22 137/85  ?   ?  ?  Passed - Valid encounter within last 6 months  ?  Recent Outpatient Visits   ?      ? 3 weeks ago Encounter for screening mammogram for malignant neoplasm of breast  ? [facility] [place], [place], NP  ? 3 weeks ago Choledocholithiasis  ? [facility], [place], NP  ? 6 months ago Prediabetes  ? [facility] [place], Coralie Keens, NP  ?  ?  ?Future Appointments   ?        ? In 2 weeks Baity, Coralie Keens, NP [facility], [place]  ?  ? ?  ?  ?  ? ? ?

## 2022-04-29 DIAGNOSIS — G4733 Obstructive sleep apnea (adult) (pediatric): Secondary | ICD-10-CM | POA: Diagnosis not present

## 2022-05-02 ENCOUNTER — Ambulatory Visit: Payer: Federal, State, Local not specified - PPO | Admitting: Internal Medicine

## 2022-05-02 ENCOUNTER — Encounter: Payer: Self-pay | Admitting: Internal Medicine

## 2022-05-02 VITALS — BP 134/88 | HR 74 | Temp 97.3°F | Wt 232.0 lb

## 2022-05-02 DIAGNOSIS — Z23 Encounter for immunization: Secondary | ICD-10-CM

## 2022-05-02 DIAGNOSIS — Z6839 Body mass index (BMI) 39.0-39.9, adult: Secondary | ICD-10-CM

## 2022-05-02 DIAGNOSIS — R7303 Prediabetes: Secondary | ICD-10-CM | POA: Diagnosis not present

## 2022-05-02 NOTE — Assessment & Plan Note (Signed)
We will continue Ozempic at this time Encourage low-carb diet and exercise for weight loss

## 2022-05-02 NOTE — Progress Notes (Signed)
Subjective:    Patient ID: Anne Mcguire, female    DOB: 1971/08/07, 51 y.o.   MRN: [id number]  HPI  Patient presents to clinic today for 1 month weight check and med refill.  She was started on Ozempic at her last visit.  She has been taking the medication as prescribed and denies adverse side effects.  Her starting weight was 235 pounds with a BMI of 40.32.  She has a history of HTN, HLD and prediabetes.  Her weight today is 232 pounds with a BMI of 39.82.  She would like her shingles vaccine today.  Review of Systems     Past Medical History:  Diagnosis Date   Anxiety 06/01/2021   COVID 12/2020   headache, fever, stuffy nose, loss of taste and smell x few weeks all sx resolved except taste is altered   Depression 06/01/2021   Gastric ulcer    yrs ago no current problems per pt on 06-01-2021   GERD (gastroesophageal reflux disease) 06/01/2021   Hyperlipidemia 06/01/2021   Hypertension 06/01/2021   Hypothyroidism 06/01/2021   Menorrhagia 06/01/2021   Migraines 06/01/2021   regulae headaches also @ times   Nocturia 06/01/2021   Plantar fasciitis 06/01/2021   both feet right worse than left   Sleep apnea 06/01/2021   cpap severe osa does not know cpap settings   SUI (stress urinary incontinence, female) 06/01/2021   Wears glasses 06/01/2021    Current Outpatient Medications  Medication Sig Dispense Refill   atorvastatin (LIPITOR) 10 MG tablet TAKE 1 TABLET BY MOUTH ONCE DAILY 90 tablet 3   buPROPion (WELLBUTRIN SR) 150 MG 12 hr tablet TAKE 1 TABLET BY MOUTH ONCE DAILY 90 tablet 0   busPIRone (BUSPAR) 10 MG tablet Take 1 tablet (10 mg total) by mouth 3 (three) times daily. 90 tablet 1   FLUoxetine (PROZAC) 40 MG capsule Take 1 capsule (40 mg total) by mouth daily. 90 capsule 1   hydrOXYzine (ATARAX) 10 MG tablet TAKE 1 TABLET BY MOUTH ONCE DAILY AS NEEDED 30 tablet 0   levothyroxine (SYNTHROID) 100 MCG tablet TAKE 1 TABLET BY MOUTH EVERY MORNING WITH BREAKFAST 90  tablet 2   lisinopril-hydrochlorothiazide (ZESTORETIC) 20-12.5 MG tablet TAKE 1 TABLET BY MOUTH ONCE A DAY 90 tablet 1   omeprazole (PRILOSEC) 20 MG capsule TAKE 1 CAPSULE BY MOUTH ONCE DAILY 90 capsule 0   OZEMPIC, 0.25 OR 0.5 MG/DOSE, 2 MG/1.5ML SOPN Inject 0.25 mg into the skin once a week. For first 4 weeks. Then increase dose to 0.'5mg'$  weekly 4.5 mL 0   No current facility-administered medications for this visit.    Allergies  Allergen Reactions   Azithromycin Hives    Family History  Problem Relation Age of Onset   Mental illness Mother    Arthritis Father    Hyperlipidemia Father    Hypertension Father    Diabetes Father    Arthritis Maternal Grandmother    Diabetes Maternal Grandmother    Arthritis Maternal Grandfather    Diabetes Maternal Grandfather    Arthritis Paternal Grandmother    Breast cancer Paternal Grandmother     Social History   Socioeconomic History   Marital status: Married    Spouse name: Not on file   Number of children: Not on file   Years of education: Not on file   Highest education level: Not on file  Occupational History   Not on file  Tobacco Use   Smoking status: Never   Smokeless  tobacco: Never  Vaping Use   Vaping Use: Never used  Substance and Sexual Activity   Alcohol use: Yes    Comment: occasional   Drug use: No   Sexual activity: Yes  Other Topics Concern   Not on file  Social History Narrative   Not on file   Social Determinants of Health   Financial Resource Strain: Not on file  Food Insecurity: Not on file  Transportation Needs: Not on file  Physical Activity: Not on file  Stress: Not on file  Social Connections: Not on file  Intimate Partner Violence: Not on file     Constitutional: Denies fever, malaise, fatigue, headache or abrupt weight changes.  Respiratory: Denies difficulty breathing, shortness of breath, cough or sputum production.   Cardiovascular: Denies chest pain, chest tightness, palpitations or  swelling in the hands or feet.  Gastrointestinal: Patient reports intermittent diarrhea.  Denies abdominal pain, bloating, constipation, or blood in the stool.  Neurological: Denies dizziness, difficulty with memory, difficulty with speech or problems with balance and coordination.    No other specific complaints in a complete review of systems (except as listed in HPI above).  Objective:   Physical Exam  BP 134/88 (BP Location: Left Arm, Patient Position: Sitting, Cuff Size: Large)   Pulse 74   Temp (!) 97.3 F (36.3 C) (Temporal)   Wt 232 lb (105.2 kg)   LMP 05/15/2021   SpO2 98%   BMI 39.82 kg/m   Wt Readings from Last 3 Encounters:  04/10/22 233 lb 9.6 oz (106 kg)  03/28/22 235 lb (106.6 kg)  03/22/22 236 lb (107 kg)    General: Appears her stated age, obese, in NAD. Cardiovascular: Normal rate. Pulmonary/Chest: Normal effort. Neurological: Alert and oriented.   BMET    Component Value Date/Time   NA 138 03/14/2022 1320   K 3.4 (L) 03/14/2022 1320   CL 104 03/14/2022 1320   CO2 27 03/14/2022 1320   GLUCOSE 109 (H) 03/14/2022 1320   BUN 15 03/14/2022 1320   CREATININE 0.70 03/14/2022 1320   CREATININE 0.77 09/27/2021 0850   CALCIUM 8.3 (L) 03/14/2022 1320   GFRNONAA >60 03/14/2022 1320    Lipid Panel     Component Value Date/Time   CHOL 177 03/28/2022 0835   TRIG 145 03/28/2022 0835   HDL 57 03/28/2022 0835   CHOLHDL 3.1 03/28/2022 0835   VLDL 21.4 03/25/2020 0749   LDLCALC 95 03/28/2022 0835    CBC    Component Value Date/Time   WBC 9.7 03/15/2022 0358   RBC 4.06 03/15/2022 0358   HGB 11.9 (L) 03/15/2022 0358   HCT 37.4 03/15/2022 0358   PLT 151 03/15/2022 0358   MCV 92.1 03/15/2022 0358   MCH 29.3 03/15/2022 0358   MCHC 31.8 03/15/2022 0358   RDW 14.1 03/15/2022 0358   LYMPHSABS 1.5 03/14/2022 0002   MONOABS 0.7 03/14/2022 0002   EOSABS 0.1 03/14/2022 0002   BASOSABS 0.0 03/14/2022 0002    Hgb A1C Lab Results  Component Value Date    HGBA1C 5.7 (H) 03/28/2022            Assessment & Plan:     Webb Silversmith, NP

## 2022-05-02 NOTE — Patient Instructions (Signed)
Calorie Counting for Weight Loss Calories are units of energy. Your body needs a certain number of calories from food to keep going throughout the day. When you eat or drink more calories than your body needs, your body stores the extra calories mostly as fat. When you eat or drink fewer calories than your body needs, your body burns fat to get the energy it needs. Calorie counting means keeping track of how many calories you eat and drink each day. Calorie counting can be helpful if you need to lose weight. If you eat fewer calories than your body needs, you should lose weight. Ask your health care provider what a healthy weight is for you. For calorie counting to work, you will need to eat the right number of calories each day to lose a healthy amount of weight per week. A dietitian can help you figure out how many calories you need in a day and will suggest ways to reach your calorie goal. A healthy amount of weight to lose each week is usually 1-2 lb (0.5-0.9 kg). This usually means that your daily calorie intake should be reduced by 500-750 calories. Eating 1,200-1,500 calories a day can help most women lose weight. Eating 1,500-1,800 calories a day can help most men lose weight. What do I need to know about calorie counting? Work with your health care provider or dietitian to determine how many calories you should get each day. To meet your daily calorie goal, you will need to: Find out how many calories are in each food that you would like to eat. Try to do this before you eat. Decide how much of the food you plan to eat. Keep a food log. Do this by writing down what you ate and how many calories it had. To successfully lose weight, it is important to balance calorie counting with a healthy lifestyle that includes regular activity. Where do I find calorie information?  The number of calories in a food can be found on a Nutrition Facts label. If a food does not have a Nutrition Facts label, try  to look up the calories online or ask your dietitian for help. Remember that calories are listed per serving. If you choose to have more than one serving of a food, you will have to multiply the calories per serving by the number of servings you plan to eat. For example, the label on a package of bread might say that a serving size is 1 slice and that there are 90 calories in a serving. If you eat 1 slice, you will have eaten 90 calories. If you eat 2 slices, you will have eaten 180 calories. How do I keep a food log? After each time that you eat, record the following in your food log as soon as possible: What you ate. Be sure to include toppings, sauces, and other extras on the food. How much you ate. This can be measured in cups, ounces, or number of items. How many calories were in each food and drink. The total number of calories in the food you ate. Keep your food log near you, such as in a pocket-sized notebook or on an app or website on your mobile phone. Some programs will calculate calories for you and show you how many calories you have left to meet your daily goal. What are some portion-control tips? Know how many calories are in a serving. This will help you know how many servings you can have of a certain   food. Use a measuring cup to measure serving sizes. You could also try weighing out portions on a kitchen scale. With time, you will be able to estimate serving sizes for some foods. Take time to put servings of different foods on your favorite plates or in your favorite bowls and cups so you know what a serving looks like. Try not to eat straight from a food's packaging, such as from a bag or box. Eating straight from the package makes it hard to see how much you are eating and can lead to overeating. Put the amount you would like to eat in a cup or on a plate to make sure you are eating the right portion. Use smaller plates, glasses, and bowls for smaller portions and to prevent  overeating. Try not to multitask. For example, avoid watching TV or using your computer while eating. If it is time to eat, sit down at a table and enjoy your food. This will help you recognize when you are full. It will also help you be more mindful of what and how much you are eating. What are tips for following this plan? Reading food labels Check the calorie count compared with the serving size. The serving size may be smaller than what you are used to eating. Check the source of the calories. Try to choose foods that are high in protein, fiber, and vitamins, and low in saturated fat, trans fat, and sodium. Shopping Read nutrition labels while you shop. This will help you make healthy decisions about which foods to buy. Pay attention to nutrition labels for low-fat or fat-free foods. These foods sometimes have the same number of calories or more calories than the full-fat versions. They also often have added sugar, starch, or salt to make up for flavor that was removed with the fat. Make a grocery list of lower-calorie foods and stick to it. Cooking Try to cook your favorite foods in a healthier way. For example, try baking instead of frying. Use low-fat dairy products. Meal planning Use more fruits and vegetables. One-half of your plate should be fruits and vegetables. Include lean proteins, such as chicken, turkey, and fish. Lifestyle Each week, aim to do one of the following: 150 minutes of moderate exercise, such as walking. 75 minutes of vigorous exercise, such as running. General information Know how many calories are in the foods you eat most often. This will help you calculate calorie counts faster. Find a way of tracking calories that works for you. Get creative. Try different apps or programs if writing down calories does not work for you. What foods should I eat?  Eat nutritious foods. It is better to have a nutritious, high-calorie food, such as an avocado, than a food with  few nutrients, such as a bag of potato chips. Use your calories on foods and drinks that will fill you up and will not leave you hungry soon after eating. Examples of foods that fill you up are nuts and nut butters, vegetables, lean proteins, and high-fiber foods such as whole grains. High-fiber foods are foods with more than 5 g of fiber per serving. Pay attention to calories in drinks. Low-calorie drinks include water and unsweetened drinks. The items listed above may not be a complete list of foods and beverages you can eat. Contact a dietitian for more information. What foods should I limit? Limit foods or drinks that are not good sources of vitamins, minerals, or protein or that are high in unhealthy fats. These   include: Candy. Other sweets. Sodas, specialty coffee drinks, alcohol, and juice. The items listed above may not be a complete list of foods and beverages you should avoid. Contact a dietitian for more information. How do I count calories when eating out? Pay attention to portions. Often, portions are much larger when eating out. Try these tips to keep portions smaller: Consider sharing a meal instead of getting your own. If you get your own meal, eat only half of it. Before you start eating, ask for a container and put half of your meal into it. When available, consider ordering smaller portions from the menu instead of full portions. Pay attention to your food and drink choices. Knowing the way food is cooked and what is included with the meal can help you eat fewer calories. If calories are listed on the menu, choose the lower-calorie options. Choose dishes that include vegetables, fruits, whole grains, low-fat dairy products, and lean proteins. Choose items that are boiled, broiled, grilled, or steamed. Avoid items that are buttered, battered, fried, or served with cream sauce. Items labeled as crispy are usually fried, unless stated otherwise. Choose water, low-fat milk,  unsweetened iced tea, or other drinks without added sugar. If you want an alcoholic beverage, choose a lower-calorie option, such as a glass of wine or light beer. Ask for dressings, sauces, and syrups on the side. These are usually high in calories, so you should limit the amount you eat. If you want a salad, choose a garden salad and ask for grilled meats. Avoid extra toppings such as bacon, cheese, or fried items. Ask for the dressing on the side, or ask for olive oil and vinegar or lemon to use as dressing. Estimate how many servings of a food you are given. Knowing serving sizes will help you be aware of how much food you are eating at restaurants. Where to find more information Centers for Disease Control and Prevention: www.cdc.gov [place] Department of Agriculture: myplate.gov Summary Calorie counting means keeping track of how many calories you eat and drink each day. If you eat fewer calories than your body needs, you should lose weight. A healthy amount of weight to lose per week is usually 1-2 lb (0.5-0.9 kg). This usually means reducing your daily calorie intake by 500-750 calories. The number of calories in a food can be found on a Nutrition Facts label. If a food does not have a Nutrition Facts label, try to look up the calories online or ask your dietitian for help. Use smaller plates, glasses, and bowls for smaller portions and to prevent overeating. Use your calories on foods and drinks that will fill you up and not leave you hungry shortly after a meal. This information is not intended to replace advice given to you by your health care provider. Make sure you discuss any questions you have with your health care provider. Document Revised: 01/07/2020 Document Reviewed: 01/07/2020 Elsevier Patient Education  2023 Elsevier Inc.  

## 2022-05-07 ENCOUNTER — Encounter: Payer: Self-pay | Admitting: Internal Medicine

## 2022-05-23 ENCOUNTER — Other Ambulatory Visit: Payer: Self-pay | Admitting: Internal Medicine

## 2022-05-23 NOTE — Telephone Encounter (Signed)
Refilled 03/22/2022 #90 1 refill. Requested Prescriptions  Pending Prescriptions Disp Refills  . busPIRone (BUSPAR) 10 MG tablet [Pharmacy Med Name: BUSPIRONE HCL 10 MG TAB] 90 tablet 1    Sig: TAKE 1 TABLET BY MOUTH 3 TIMES DAILY     Psychiatry: Anxiolytics/Hypnotics - Non-controlled Passed - 05/23/2022  1:30 PM      Passed - Valid encounter within last 12 months    Recent Outpatient Visits          3 weeks ago Prediabetes   [facility] [place], Coralie Keens, NP   1 month ago Encounter for screening mammogram for malignant neoplasm of breast   [facility] [place], Coralie Keens, NP   2 months ago Choledocholithiasis   [place], Coralie Keens, NP   7 months ago Prediabetes   [facility] [place], Coralie Keens, NP      Future Appointments            In 4 months Baity, Coralie Keens, NP [facility], [facility]

## 2022-05-25 ENCOUNTER — Encounter: Payer: Self-pay | Admitting: Internal Medicine

## 2022-05-25 DIAGNOSIS — R195 Other fecal abnormalities: Secondary | ICD-10-CM

## 2022-05-25 LAB — COLOGUARD: COLOGUARD: POSITIVE — AB

## 2022-05-28 ENCOUNTER — Other Ambulatory Visit: Payer: Self-pay

## 2022-05-28 ENCOUNTER — Telehealth: Payer: Self-pay

## 2022-05-28 NOTE — Telephone Encounter (Signed)
CALLED PATIENT NO ANSWER LEFT VOICEMAIL FOR A CALL BACK ? ?

## 2022-05-28 NOTE — Progress Notes (Unsigned)
Gastroenterology Pre-Procedure Review  Request Date: *** Requesting Physician: Dr. Marland Kitchen  PATIENT REVIEW QUESTIONS: The patient responded to the following health history questions as indicated:    1. Are you having any GI issues? {Yes/No:19989} 2. Do you have a personal history of Polyps? {Yes/No:19989} 3. Do you have a family history of Colon Cancer or Polyps? {Yes/No:19989} 4. Diabetes Mellitus? {Yes/No:19989} 5. Joint replacements in the past 12 months?{Yes/No:19989} 6. Major health problems in the past 3 months?{Yes/No:19989} 7. Any artificial heart valves, MVP, or defibrillator?{Yes/No:19989}    MEDICATIONS & ALLERGIES:    Patient reports the following regarding taking any anticoagulation/antiplatelet therapy:   Plavix, Coumadin, Eliquis, Xarelto, Lovenox, Pradaxa, Brilinta, or Effient? {Yes/No:19989} Aspirin? {Yes/No:19989}  Patient confirms/reports the following medications:  Current Outpatient Medications  Medication Sig Dispense Refill   atorvastatin (LIPITOR) 10 MG tablet TAKE 1 TABLET BY MOUTH ONCE DAILY 90 tablet 3   buPROPion (WELLBUTRIN SR) 150 MG 12 hr tablet TAKE 1 TABLET BY MOUTH ONCE DAILY 90 tablet 0   busPIRone (BUSPAR) 10 MG tablet Take 1 tablet (10 mg total) by mouth 3 (three) times daily. 90 tablet 1   FLUoxetine (PROZAC) 40 MG capsule Take 1 capsule (40 mg total) by mouth daily. 90 capsule 1   hydrOXYzine (ATARAX) 10 MG tablet TAKE 1 TABLET BY MOUTH ONCE DAILY AS NEEDED 30 tablet 0   levothyroxine (SYNTHROID) 100 MCG tablet TAKE 1 TABLET BY MOUTH EVERY MORNING WITH BREAKFAST 90 tablet 2   lisinopril-hydrochlorothiazide (ZESTORETIC) 20-12.5 MG tablet TAKE 1 TABLET BY MOUTH ONCE A DAY 90 tablet 1   omeprazole (PRILOSEC) 20 MG capsule TAKE 1 CAPSULE BY MOUTH ONCE DAILY 90 capsule 0   OZEMPIC, 0.25 OR 0.5 MG/DOSE, 2 MG/1.5ML SOPN Inject 0.25 mg into the skin once a week. For first 4 weeks. Then increase dose to 0.'5mg'$  weekly 4.5 mL 0   No current  facility-administered medications for this visit.    Patient confirms/reports the following allergies:  Allergies  Allergen Reactions   Azithromycin Hives    No orders of the defined types were placed in this encounter.   AUTHORIZATION INFORMATION Primary Insurance: 1D#: Group #:  Secondary Insurance: 1D#: Group #:  SCHEDULE INFORMATION: Date:  Time: Location:

## 2022-05-30 ENCOUNTER — Other Ambulatory Visit: Payer: Self-pay | Admitting: Nurse Practitioner

## 2022-05-30 DIAGNOSIS — N6320 Unspecified lump in the left breast, unspecified quadrant: Secondary | ICD-10-CM | POA: Diagnosis not present

## 2022-05-31 ENCOUNTER — Other Ambulatory Visit: Payer: Self-pay

## 2022-05-31 ENCOUNTER — Telehealth: Payer: Self-pay

## 2022-05-31 DIAGNOSIS — R195 Other fecal abnormalities: Secondary | ICD-10-CM

## 2022-05-31 MED ORDER — NA SULFATE-K SULFATE-MG SULF 17.5-3.13-1.6 GM/177ML PO SOLN: 1.0000 | Freq: Once | ORAL | 0 refills | Status: AC

## 2022-06-01 ENCOUNTER — Ambulatory Visit
Admission: RE | Admit: 2022-06-01 | Discharge: 2022-06-01 | Disposition: A | Discharge status: Home / Self Care | Payer: Federal, State, Local not specified - PPO | Source: Ambulatory Visit | Attending: Nurse Practitioner | Admitting: Nurse Practitioner

## 2022-06-01 DIAGNOSIS — R928 Other abnormal and inconclusive findings on diagnostic imaging of breast: Secondary | ICD-10-CM | POA: Diagnosis not present

## 2022-06-01 DIAGNOSIS — N6012 Diffuse cystic mastopathy of left breast: Secondary | ICD-10-CM | POA: Diagnosis not present

## 2022-06-01 DIAGNOSIS — N6320 Unspecified lump in the left breast, unspecified quadrant: Secondary | ICD-10-CM

## 2022-06-04 ENCOUNTER — Other Ambulatory Visit: Payer: Self-pay

## 2022-06-04 ENCOUNTER — Encounter: Payer: Self-pay | Admitting: Gastroenterology

## 2022-06-06 ENCOUNTER — Other Ambulatory Visit: Payer: Federal, State, Local not specified - PPO

## 2022-06-08 ENCOUNTER — Other Ambulatory Visit: Payer: Self-pay | Admitting: Internal Medicine

## 2022-06-08 NOTE — Telephone Encounter (Signed)
Requested medications are due for refill today.  yes  Requested medications are on the active medications list.  yes  Last refill. 09/29/2021 #90 1 refill  Future visit scheduled.   yes  Notes to clinic.  Failed protocol d/t abnormal unaddressed labs.    Requested Prescriptions  Pending Prescriptions Disp Refills   lisinopril-hydrochlorothiazide (ZESTORETIC) 20-12.5 MG tablet [Pharmacy Med Name: LISINOPRIL-HCTZ 20-12.5 MG TAB] 90 tablet 1    Sig: TAKE 1 TABLET BY MOUTH ONCE A DAY     Cardiovascular:  ACEI + Diuretic Combos Failed - 06/08/2022 12:51 PM      Failed - K in normal range and within 180 days    Potassium  Date Value Ref Range Status  03/14/2022 3.4 (L) 3.5 - 5.1 mmol/L Final         Passed - Na in normal range and within 180 days    Sodium  Date Value Ref Range Status  03/14/2022 138 135 - 145 mmol/L Final         Passed - Cr in normal range and within 180 days    Creat  Date Value Ref Range Status  09/27/2021 0.77 0.50 - 0.99 mg/dL Final   Creatinine, Ser  Date Value Ref Range Status  03/14/2022 0.70 0.44 - 1.00 mg/dL Final         Passed - eGFR is 30 or above and within 180 days    GFR, Estimated  Date Value Ref Range Status  03/14/2022 >60 >60 mL/min Final    Comment:    (NOTE) Calculated using the CKD-EPI Creatinine Equation (2021)    GFR  Date Value Ref Range Status  03/09/2021 86.70 >60.00 mL/min Final    Comment:    Calculated using the CKD-EPI Creatinine Equation (2021)   eGFR  Date Value Ref Range Status  09/27/2021 95 > OR = 60 mL/min/1.33m Final    Comment:    The eGFR is based on the CKD-EPI 2021 equation. To calculate  the new eGFR from a previous Creatinine or Cystatin C result, go to https://www.kidney.org/professionals/ kdoqi/gfr%5Fcalculator          Passed - Patient is not pregnant      Passed - Last BP in normal range    BP Readings from Last 1 Encounters:  05/02/22 134/88         Passed - Valid encounter within  last 6 months    Recent Outpatient Visits           [facility] month ago [facility]   2 months ago [facility] for screening mammogram for malignant neoplasm of breast   [facility]   2 months ago [place]   [place] [facility]   8 months ago [facility]       Future Appointments             In 4 months Baity, [facility]

## 2022-06-14 DIAGNOSIS — Z1151 Encounter for screening for human papillomavirus (HPV): Secondary | ICD-10-CM | POA: Diagnosis not present

## 2022-06-14 DIAGNOSIS — Z01419 Encounter for gynecological examination (general) (routine) without abnormal findings: Secondary | ICD-10-CM | POA: Diagnosis not present

## 2022-06-14 DIAGNOSIS — N760 Acute vaginitis: Secondary | ICD-10-CM | POA: Diagnosis not present

## 2022-06-14 DIAGNOSIS — Z1272 Encounter for screening for malignant neoplasm of vagina: Secondary | ICD-10-CM | POA: Diagnosis not present

## 2022-06-14 DIAGNOSIS — Z6838 Body mass index (BMI) 38.0-38.9, adult: Secondary | ICD-10-CM | POA: Diagnosis not present

## 2022-06-14 DIAGNOSIS — R350 Frequency of micturition: Secondary | ICD-10-CM | POA: Diagnosis not present

## 2022-06-15 LAB — HM PAP SMEAR: HPV, high-risk: NEGATIVE

## 2022-06-18 ENCOUNTER — Ambulatory Visit: Payer: Federal, State, Local not specified - PPO | Admitting: Anesthesiology

## 2022-06-18 ENCOUNTER — Encounter
Admission: RE | Disposition: A | Discharge status: Home / Self Care | Payer: Self-pay | Source: Home / Self Care | Attending: Gastroenterology

## 2022-06-18 ENCOUNTER — Ambulatory Visit
Admission: RE | Admit: 2022-06-18 | Discharge: 2022-06-18 | Disposition: A | Discharge status: Home / Self Care | Payer: Federal, State, Local not specified - PPO | Attending: Gastroenterology | Admitting: Gastroenterology

## 2022-06-18 ENCOUNTER — Encounter: Payer: Self-pay | Admitting: Gastroenterology

## 2022-06-18 ENCOUNTER — Other Ambulatory Visit: Payer: Self-pay

## 2022-06-18 DIAGNOSIS — R195 Other fecal abnormalities: Secondary | ICD-10-CM

## 2022-06-18 DIAGNOSIS — K635 Polyp of colon: Secondary | ICD-10-CM

## 2022-06-18 DIAGNOSIS — D122 Benign neoplasm of ascending colon: Secondary | ICD-10-CM | POA: Diagnosis not present

## 2022-06-18 DIAGNOSIS — K640 First degree hemorrhoids: Secondary | ICD-10-CM | POA: Diagnosis not present

## 2022-06-18 DIAGNOSIS — D120 Benign neoplasm of cecum: Secondary | ICD-10-CM | POA: Diagnosis not present

## 2022-06-18 DIAGNOSIS — G4730 Sleep apnea, unspecified: Secondary | ICD-10-CM | POA: Diagnosis not present

## 2022-06-18 DIAGNOSIS — K5730 Diverticulosis of large intestine without perforation or abscess without bleeding: Secondary | ICD-10-CM | POA: Insufficient documentation

## 2022-06-18 HISTORY — PX: POLYPECTOMY: SHX5525

## 2022-06-18 HISTORY — DX: Prediabetes: R73.03

## 2022-06-18 HISTORY — PX: COLONOSCOPY WITH PROPOFOL: SHX5780

## 2022-06-18 SURGERY — COLONOSCOPY WITH PROPOFOL
Anesthesia: General | Site: Rectum

## 2022-06-18 MED ORDER — STERILE WATER FOR IRRIGATION IR SOLN
Status: DC | PRN
  Administered 2022-06-18: 1

## 2022-06-18 MED ORDER — LACTATED RINGERS IV SOLN: INTRAVENOUS | Status: DC

## 2022-06-18 MED ORDER — LIDOCAINE HCL (CARDIAC) PF 100 MG/5ML IV SOSY
PREFILLED_SYRINGE | INTRAVENOUS | Status: DC | PRN
  Administered 2022-06-18: 40 mg via INTRAVENOUS

## 2022-06-18 MED ORDER — SODIUM CHLORIDE 0.9 % IV SOLN: INTRAVENOUS | Status: DC

## 2022-06-18 MED ORDER — PROPOFOL 10 MG/ML IV BOLUS
INTRAVENOUS | Status: DC | PRN
  Administered 2022-06-18: 50 mg via INTRAVENOUS
  Administered 2022-06-18: 80 mg via INTRAVENOUS
  Administered 2022-06-18 (×2): 50 mg via INTRAVENOUS

## 2022-06-18 SURGICAL SUPPLY — 8 items
GOWN CVR UNV OPN BCK APRN NK (MISCELLANEOUS) ×2 IMPLANT
GOWN ISOL THUMB LOOP REG UNIV (MISCELLANEOUS) ×4
KIT PRC NS LF DISP ENDO (KITS) ×1 IMPLANT
KIT PROCEDURE OLYMPUS (KITS) ×2
MANIFOLD NEPTUNE II (INSTRUMENTS) ×2 IMPLANT
SNARE COLD EXACTO (MISCELLANEOUS) ×1 IMPLANT
TRAP ETRAP POLY (MISCELLANEOUS) ×1 IMPLANT
WATER STERILE IRR 250ML POUR (IV SOLUTION) ×2 IMPLANT

## 2022-06-18 NOTE — Anesthesia Postprocedure Evaluation (Signed)
Anesthesia Post Note  Patient: Anne Mcguire  Procedure(s) Performed: COLONOSCOPY WITH BIOPSY (Rectum) POLYPECTOMY (Rectum)     Patient location during evaluation: PACU Anesthesia Type: General Level of consciousness: awake and alert Pain management: pain level controlled Vital Signs Assessment: post-procedure vital signs reviewed and stable Respiratory status: spontaneous breathing, nonlabored ventilation, respiratory function stable and patient connected to nasal cannula oxygen Cardiovascular status: blood pressure returned to baseline and stable Postop Assessment: no apparent nausea or vomiting Anesthetic complications: no   No notable events documented.  Mailynn Everly, Glade Stanford

## 2022-06-18 NOTE — H&P (Signed)
Lucilla Lame, MD [address] [place], [place] [address] [phone] Fax : [phone]  Primary Care Physician:  Jearld Fenton, NP Primary Gastroenterologist:  Dr. Allen Norris  Pre-Procedure History & Physical: HPI:  Anne Mcguire is a 51 y.o. female is here for an colonoscopy.   Past Medical History:  Diagnosis Date   Anxiety 06/01/2021   COVID 12/2020   headache, fever, stuffy nose, loss of taste and smell x few weeks all sx resolved except taste is altered   Depression 06/01/2021   Gastric ulcer    yrs ago no current problems per pt on 06-01-2021   GERD (gastroesophageal reflux disease) 06/01/2021   Hyperlipidemia 06/01/2021   Hypertension 06/01/2021   Hypothyroidism 06/01/2021   Menorrhagia 06/01/2021   Migraines 06/01/2021   regulae headaches also @ times   Nocturia 06/01/2021   Plantar fasciitis 06/01/2021   both feet right worse than left   Prediabetes    Sleep apnea 06/01/2021   cpap severe osa does not know cpap settings   SUI (stress urinary incontinence, female) 06/01/2021   Wears glasses 06/01/2021    Past Surgical History:  Procedure Laterality Date   ABDOMINAL HYSTERECTOMY     BLADDER SUSPENSION N/A 06/07/2021   Procedure: TRANSVAGINAL TAPE (TVT) PROCEDURE;  Surgeon: [name], DO;  Location: [place];  Service: Gynecology;  Laterality: N/A;   CATARACT EXTRACTION, BILATERAL  07/11/2017   DILITATION & CURRETTAGE/HYSTROSCOPY WITH NOVASURE ABLATION N/A 01/10/2017   Procedure: DILATATION & CURETTAGE/HYSTEROSCOPY WITH NOVASURE ABLATION;  Surgeon: [name], DO;  Location: [place];  Service: Gynecology;  Laterality: N/A;   ERCP N/A 03/15/2022   Procedure: ENDOSCOPIC RETROGRADE CHOLANGIOPANCREATOGRAPHY (ERCP);  Surgeon: Lucilla Lame, MD;  [facility] ENDOSCOPY;  Service: Endoscopy;  Laterality: N/A;   EYE SURGERY     LAPAROSCOPIC TUBAL LIGATION Bilateral 01/10/2017   Procedure: LAPAROSCOPIC  TUBAL LIGATION with Filshie Clips;  Surgeon: [name], DO;  Location: [place];  Service: Gynecology;  Laterality: Bilateral;   LAPAROSCOPIC VAGINAL HYSTERECTOMY WITH SALPINGECTOMY Bilateral 06/07/2021   Procedure: LAPAROSCOPIC ASSISTED VAGINAL HYSTERECTOMY WITH BILATERAL SALPINGECTOMY;  Surgeon: [name], DO;  Location: [place];  Service: Gynecology;  Laterality: Bilateral;   TUBAL LIGATION     WISDOM TOOTH EXTRACTION  2013    Prior to Admission medications   Medication Sig Start Date End Date Taking? Authorizing Provider  atorvastatin (LIPITOR) 10 MG tablet TAKE 1 TABLET BY MOUTH ONCE DAILY 11/15/21  Yes Jearld Fenton, NP  buPROPion [facility] SR) 150 MG 12 hr tablet TAKE 1 TABLET BY MOUTH ONCE DAILY 04/18/22  Yes Baity, Coralie Keens, NP  busPIRone (BUSPAR) 10 MG tablet Take 1 tablet (10 mg total) by mouth 3 (three) times daily. 03/22/22  Yes Jearld Fenton, NP  FLUoxetine (PROZAC) 40 MG capsule Take 1 capsule (40 mg total) by mouth daily. 09/27/21  Yes Baity, Coralie Keens, NP  hydrOXYzine (ATARAX) 10 MG tablet TAKE 1 TABLET BY MOUTH ONCE DAILY AS NEEDED 02/15/22  Yes Baity, Coralie Keens, NP  levothyroxine (SYNTHROID) 100 MCG tablet TAKE 1 TABLET BY MOUTH EVERY MORNING WITH BREAKFAST 12/22/21  Yes Jearld Fenton, NP  lisinopril-hydrochlorothiazide (ZESTORETIC) 20-12.5 MG tablet TAKE 1 TABLET BY MOUTH ONCE A DAY 06/08/22  Yes Jearld Fenton, NP  omeprazole (PRILOSEC) 20 MG capsule TAKE 1 CAPSULE BY MOUTH ONCE DAILY 08/28/21  Yes Dutch Quint B, FNP  OZEMPIC, 0.25 OR 0.5 MG/DOSE, 2 MG/1.5ML SOPN Inject 0.25  mg into the skin once a week. For first 4 weeks. Then increase dose to 0.'5mg'$  weekly 03/28/22   Jearld Fenton, NP    Allergies as of 05/31/2022 - Review Complete 05/28/2022  Allergen Reaction Noted   Azithromycin Hives 02/19/2014    Family History  Problem Relation Age of Onset   Mental illness Mother    Arthritis Father    Hyperlipidemia Father     Hypertension Father    Diabetes Father    Arthritis Maternal Grandmother    Diabetes Maternal Grandmother    Arthritis Maternal Grandfather    Diabetes Maternal Grandfather    Arthritis Paternal Grandmother    Breast cancer Paternal Grandmother     Social History   Socioeconomic History   Marital status: Married    Spouse name: Not on file   Number of children: Not on file   Years of education: Not on file   Highest education level: Not on file  Occupational History   Not on file  Tobacco Use   Smoking status: Never   Smokeless tobacco: Never  Vaping Use   Vaping Use: Never used  Substance and Sexual Activity   Alcohol use: Yes    Comment: occasional   Drug use: No   Sexual activity: Yes  Other Topics Concern   Not on file  Social History Narrative   Not on file   Social Determinants of Health   Financial Resource Strain: Not on file  Food Insecurity: Not on file  Transportation Needs: Not on file  Physical Activity: Not on file  Stress: Not on file  Social Connections: Not on file  Intimate Partner Violence: Not on file    Review of Systems: See HPI, otherwise negative ROS  Physical Exam: BP 135/78   Pulse 73   Temp 98.8 F (37.1 C) (Temporal)   Resp 18   Ht '5\' 4"'$  (1.626 m)   Wt 99.8 kg   LMP 05/15/2021   SpO2 97%   BMI 37.76 kg/m  General:   Alert,  pleasant and cooperative in NAD Head:  Normocephalic and atraumatic. Neck:  Supple; no masses or thyromegaly. Lungs:  Clear throughout to auscultation.    Heart:  Regular rate and rhythm. Abdomen:  Soft, nontender and nondistended. Normal bowel sounds, without guarding, and without rebound.   Neurologic:  Alert and  oriented x4;  grossly normal neurologically.  Impression/Plan: Anne Mcguire is here for an colonoscopy to be performed for positive cologuard   Risks, benefits, limitations, and alternatives regarding  colonoscopy have been reviewed with the patient.  Questions have been answered.   All parties agreeable.   Lucilla Lame, MD  06/18/2022, 7:55 AM

## 2022-06-18 NOTE — Transfer of Care (Signed)
Immediate Anesthesia Transfer of Care Note  Patient: Anne Mcguire  Procedure(s) Performed: COLONOSCOPY WITH BIOPSY (Rectum) POLYPECTOMY (Rectum)  Patient Location: PACU  Anesthesia Type: General  Level of Consciousness: awake, alert  and patient cooperative  Airway and Oxygen Therapy: Patient Spontanous Breathing and Patient connected to supplemental oxygen  Post-op Assessment: Post-op Vital signs reviewed, Patient's Cardiovascular Status Stable, Respiratory Function Stable, Patent Airway and No signs of Nausea or vomiting  Post-op Vital Signs: Reviewed and stable  Complications: No notable events documented.

## 2022-06-18 NOTE — Op Note (Signed)
[facility] Gastroenterology Patient Name: Anne Mcguire Procedure Date: 06/18/2022 7:55 AM MRN: [id number] Account #: [id number] Date of Birth: 05-05-1971 Admit Type: Outpatient Age: 51 Room: [facility] OR ROOM 01 Gender: Female Note Status: Finalized Instrument Name: [id number] Procedure:             Colonoscopy Indications:           Positive Cologuard test Providers:             Lucilla Lame MD, MD Referring MD:          Jearld Fenton (Referring MD) Medicines:             Propofol per Anesthesia Complications:         No immediate complications. Procedure:             Pre-Anesthesia Assessment:                        - Prior to the procedure, a History and Physical was                         performed, and patient medications and allergies were                         reviewed. The patient's tolerance of previous                         anesthesia was also reviewed. The risks and benefits                         of the procedure and the sedation options and risks                         were discussed with the patient. All questions were                         answered, and informed consent was obtained. Prior                         Anticoagulants: The patient has taken no previous                         anticoagulant or antiplatelet agents. ASA Grade                         Assessment: II - A patient with mild systemic disease.                         After reviewing the risks and benefits, the patient                         was deemed in satisfactory condition to undergo the                         procedure.                        After obtaining informed consent, the colonoscope was  passed under direct vision. Throughout the procedure,                         the patient's blood pressure, pulse, and oxygen                         saturations were monitored continuously. The                         Colonoscope was introduced through  the anus and                         advanced to the the cecum, identified by appendiceal                         orifice and ileocecal valve. The colonoscopy was                         performed without difficulty. The patient tolerated                         the procedure well. The quality of the bowel                         preparation was excellent. Findings:      The perianal and digital rectal examinations were normal.      A 4 mm polyp was found in the ascending colon. The polyp was sessile.       The polyp was removed with a cold snare. Resection and retrieval were       complete.      Many small-mouthed diverticula were found in the entire colon.      Non-bleeding internal hemorrhoids were found during retroflexion. The       hemorrhoids were Grade I (internal hemorrhoids that do not prolapse). Impression:            - One 4 mm polyp in the ascending colon, removed with                         a cold snare. Resected and retrieved.                        - Diverticulosis in the entire examined colon.                        - Non-bleeding internal hemorrhoids. Recommendation:        - Discharge patient to home.                        - Resume previous diet.                        - Continue present medications.                        - Await pathology results.                        - If the pathology report reveals adenomatous tissue,  then repeat the colonoscopy for surveillance in 7                         years. Procedure Code(s):     --- Professional ---                        [phone], Colonoscopy, flexible; with removal of                         tumor(s), polyp(s), or other lesion(s) by snare                         technique Diagnosis Code(s):     --- Professional ---                        R19.5, Other fecal abnormalities                        K63.5, Polyp of colon CPT copyright 2019 American Medical Association. All rights reserved. The codes  documented in this report are preliminary and upon coder review may  be revised to meet current compliance requirements. Lucilla Lame MD, MD 06/18/2022 8:23:21 AM This report has been signed electronically. Number of Addenda: 0 Note Initiated On: 06/18/2022 7:55 AM Scope Withdrawal Time: 0 hours 12 minutes 2 seconds  Total Procedure Duration: 0 hours 16 minutes 57 seconds  Estimated Blood Loss:  Estimated blood loss: none.      [facility]

## 2022-06-18 NOTE — Anesthesia Preprocedure Evaluation (Signed)
Anesthesia Evaluation  Patient identified by MRN, date of birth, ID band Patient awake    Reviewed: Allergy & Precautions, H&P , NPO status , Patient's Chart, lab work & pertinent test results, reviewed documented beta blocker date and time   Airway Mallampati: II  TM Distance: >3 FB Neck ROM: full    Dental no notable dental hx.    Pulmonary sleep apnea ,    Pulmonary exam normal breath sounds clear to auscultation       Cardiovascular Exercise Tolerance: Good hypertension, negative cardio ROS   Rhythm:regular Rate:Normal     Neuro/Psych  Headaches, PSYCHIATRIC DISORDERS Anxiety Depression    GI/Hepatic Neg liver ROS, PUD, GERD  ,  Endo/Other  Hypothyroidism   Renal/GU negative Renal ROS  negative genitourinary   Musculoskeletal   Abdominal   Peds  Hematology  (+) Blood dyscrasia, anemia ,   Anesthesia Other Findings   Reproductive/Obstetrics negative OB ROS                             Anesthesia Physical Anesthesia Plan  ASA: 2  Anesthesia Plan: General   Post-op Pain Management:    Induction:   PONV Risk Score and Plan: Treatment may vary due to age or medical condition and Propofol infusion  Airway Management Planned:   Additional Equipment:   Intra-op Plan:   Post-operative Plan:   Informed Consent: I have reviewed the patients History and Physical, chart, labs and discussed the procedure including the risks, benefits and alternatives for the proposed anesthesia with the patient or authorized representative who has indicated his/her understanding and acceptance.     Dental Advisory Given  Plan Discussed with: CRNA  Anesthesia Plan Comments:         Anesthesia Quick Evaluation

## 2022-06-19 ENCOUNTER — Encounter: Payer: Self-pay | Admitting: Gastroenterology

## 2022-06-19 LAB — SURGICAL PATHOLOGY

## 2022-06-21 ENCOUNTER — Encounter: Payer: Self-pay | Admitting: Gastroenterology

## 2022-06-22 ENCOUNTER — Other Ambulatory Visit: Payer: Self-pay | Admitting: Internal Medicine

## 2022-06-25 MED ORDER — HYDROXYZINE HCL 10 MG PO TABS: 10.0000 mg | ORAL_TABLET | Freq: Every day | ORAL | 0 refills | Status: DC | PRN

## 2022-07-04 ENCOUNTER — Encounter: Payer: Self-pay | Admitting: Internal Medicine

## 2022-07-09 ENCOUNTER — Other Ambulatory Visit: Payer: Self-pay | Admitting: Internal Medicine

## 2022-07-10 ENCOUNTER — Other Ambulatory Visit: Payer: Self-pay | Admitting: Internal Medicine

## 2022-07-10 MED ORDER — FLUOXETINE HCL 40 MG PO CAPS
40.0000 mg | ORAL_CAPSULE | Freq: Every day | ORAL | 1 refills | Status: DC
Start: 2022-07-10 — End: 2022-10-10

## 2022-07-10 MED ORDER — OZEMPIC (1 MG/DOSE) 4 MG/3ML ~~LOC~~ SOPN: 1.0000 mg | PEN_INJECTOR | SUBCUTANEOUS | 1 refills | Status: DC

## 2022-07-11 NOTE — Telephone Encounter (Signed)
Pt requested change of pharmacy Requested Prescriptions  Pending Prescriptions Disp Refills  . FLUoxetine (PROZAC) 40 MG capsule [Pharmacy Med Name: FLUOXETINE HCL 40 MG CAP] 90 capsule 1    Sig: TAKE 1 CAPSULE BY MOUTH ONCE DAILY     Psychiatry:  Antidepressants - SSRI Passed - 07/10/2022 11:50 AM      Passed - Completed PHQ-2 or PHQ-9 in the last 360 days      Passed - Valid encounter within last 6 months    Recent Outpatient Visits          2 months ago Prediabetes   [facility] [place], Coralie Keens, NP   3 months ago Encounter for screening mammogram for malignant neoplasm of breast   [place], NP   3 months ago Choledocholithiasis   [facility] [place], Coralie Keens, NP   9 months ago [facility] [place], Coralie Keens, NP      Future Appointments            In 3 months Baity, Coralie Keens, NP [facility], [facility]

## 2022-07-24 ENCOUNTER — Other Ambulatory Visit: Payer: Self-pay | Admitting: Internal Medicine

## 2022-07-24 DIAGNOSIS — F32A Depression, unspecified: Secondary | ICD-10-CM

## 2022-07-24 NOTE — Telephone Encounter (Signed)
Requested Prescriptions  Pending Prescriptions Disp Refills  . buPROPion (WELLBUTRIN SR) 150 MG 12 hr tablet [Pharmacy Med Name: BUPROPION HCL ER (SR) 150 MG TAB] 90 tablet 0    Sig: TAKE 1 TABLET BY MOUTH ONCE DAILY     Psychiatry: Antidepressants - bupropion Passed - 07/24/2022 10:18 AM      Passed - Cr in normal range and within 360 days    Creat  Date Value Ref Range Status  09/27/2021 0.77 0.50 - 0.99 mg/dL Final   Creatinine, Ser  Date Value Ref Range Status  03/14/2022 0.70 0.44 - 1.00 mg/dL Final         Passed - AST in normal range and within 360 days    AST  Date Value Ref Range Status  03/15/2022 15 15 - 41 U/L Final         Passed - ALT in normal range and within 360 days    ALT  Date Value Ref Range Status  03/15/2022 14 0 - 44 U/L Final         Passed - Completed PHQ-2 or PHQ-9 in the last 360 days      Passed - Last BP in normal range    BP Readings from Last 1 Encounters:  06/18/22 124/75         Passed - Valid encounter within last 6 months    Recent Outpatient Visits          2 months ago Prediabetes   [place], Coralie Keens, NP   3 months ago Encounter for screening mammogram for malignant neoplasm of breast   [facility] [place], Coralie Keens, NP   4 months ago Choledocholithiasis   [facility] [place], Coralie Keens, NP   10 months ago Prediabetes   [facility] [place], Coralie Keens, NP      Future Appointments            In 2 months Baity, Coralie Keens, NP [facility], [facility]

## 2022-08-29 ENCOUNTER — Other Ambulatory Visit: Payer: Self-pay | Admitting: Internal Medicine

## 2022-08-29 DIAGNOSIS — K219 Gastro-esophageal reflux disease without esophagitis: Secondary | ICD-10-CM

## 2022-08-29 DIAGNOSIS — F32A Depression, unspecified: Secondary | ICD-10-CM

## 2022-08-29 MED ORDER — OMEPRAZOLE 20 MG PO CPDR: 20.0000 mg | DELAYED_RELEASE_CAPSULE | Freq: Every day | ORAL | 0 refills | Status: DC

## 2022-08-29 MED ORDER — HYDROXYZINE HCL 10 MG PO TABS: 10.0000 mg | ORAL_TABLET | Freq: Every day | ORAL | 0 refills | Status: DC | PRN

## 2022-08-29 MED ORDER — BUPROPION HCL ER (SR) 150 MG PO TB12: 150.0000 mg | ORAL_TABLET | Freq: Every day | ORAL | 0 refills | Status: DC

## 2022-09-06 ENCOUNTER — Other Ambulatory Visit: Payer: Self-pay | Admitting: Internal Medicine

## 2022-09-06 NOTE — Telephone Encounter (Signed)
Requested Prescriptions  Pending Prescriptions Disp Refills  . busPIRone (BUSPAR) 10 MG tablet [Pharmacy Med Name: BUSPIRONE HCL 10 MG TAB] 270 tablet 0    Sig: TAKE 1 TABLET BY MOUTH 3 TIMES DAILY     Psychiatry: Anxiolytics/Hypnotics - Non-controlled Passed - 09/06/2022 10:54 AM      Passed - Valid encounter within last 12 months    Recent Outpatient Visits          4 months ago Prediabetes   [facility] [place], Coralie Keens, NP   5 months ago Encounter for screening mammogram for malignant neoplasm of breast   [facility] [place], Coralie Keens, NP   5 months ago Choledocholithiasis   [facility] [place], Coralie Keens, NP   11 months ago [facility] [place], Coralie Keens, NP      Future Appointments            In 1 month Baity, Coralie Keens, NP [facility], [facility]

## 2022-09-19 ENCOUNTER — Other Ambulatory Visit: Payer: Self-pay | Admitting: Internal Medicine

## 2022-09-20 NOTE — Telephone Encounter (Signed)
Requested Prescriptions  Pending Prescriptions Disp Refills  . levothyroxine (SYNTHROID) 100 MCG tablet [Pharmacy Med Name: LEVOTHYROXINE SODIUM 100 MCG TAB] 90 tablet 0    Sig: TAKE 1 TABLET BY MOUTH EVERY MORNING WITH BREAKFAST     Endocrinology:  Hypothyroid Agents Passed - 09/19/2022  1:32 PM      Passed - TSH in normal range and within 360 days    TSH  Date Value Ref Range Status  09/27/2021 1.35 mIU/L Final    Comment:              Reference Range .           > or = 20 Years  0.40-4.50 .                Pregnancy Ranges           First trimester    0.26-2.66           Second trimester   0.55-2.73           Third trimester    0.43-2.91          Passed - Valid encounter within last 12 months    Recent Outpatient Visits          4 months ago Prediabetes   [facility] [place], Coralie Keens, NP   5 months ago Encounter for screening mammogram for malignant neoplasm of breast   [facility] [place], Coralie Keens, NP   6 months ago Choledocholithiasis   [facility] [place], Coralie Keens, NP   11 months ago Prediabetes   [facility] [place], Coralie Keens, NP      Future Appointments            In 2 weeks Garnette Gunner, Coralie Keens, NP [facility], [facility]

## 2022-10-10 ENCOUNTER — Ambulatory Visit
Admission: RE | Admit: 2022-10-10 | Discharge: 2022-10-10 | Disposition: A | Discharge status: Home / Self Care | Payer: Federal, State, Local not specified - PPO | Attending: Internal Medicine | Admitting: Internal Medicine

## 2022-10-10 ENCOUNTER — Encounter: Payer: Self-pay | Admitting: Internal Medicine

## 2022-10-10 ENCOUNTER — Ambulatory Visit (INDEPENDENT_AMBULATORY_CARE_PROVIDER_SITE_OTHER): Payer: Federal, State, Local not specified - PPO | Admitting: Internal Medicine

## 2022-10-10 ENCOUNTER — Ambulatory Visit
Admission: RE | Admit: 2022-10-10 | Discharge: 2022-10-10 | Disposition: A | Discharge status: Home / Self Care | Payer: Federal, State, Local not specified - PPO | Source: Ambulatory Visit | Attending: Internal Medicine | Admitting: Internal Medicine

## 2022-10-10 VITALS — BP 130/84 | HR 79 | Temp 97.1°F | Wt 202.0 lb

## 2022-10-10 DIAGNOSIS — G8929 Other chronic pain: Secondary | ICD-10-CM

## 2022-10-10 DIAGNOSIS — K219 Gastro-esophageal reflux disease without esophagitis: Secondary | ICD-10-CM

## 2022-10-10 DIAGNOSIS — E039 Hypothyroidism, unspecified: Secondary | ICD-10-CM

## 2022-10-10 DIAGNOSIS — M25511 Pain in right shoulder: Secondary | ICD-10-CM | POA: Insufficient documentation

## 2022-10-10 DIAGNOSIS — Z6834 Body mass index (BMI) 34.0-34.9, adult: Secondary | ICD-10-CM

## 2022-10-10 DIAGNOSIS — D500 Iron deficiency anemia secondary to blood loss (chronic): Secondary | ICD-10-CM

## 2022-10-10 DIAGNOSIS — I10 Essential (primary) hypertension: Secondary | ICD-10-CM

## 2022-10-10 DIAGNOSIS — R7303 Prediabetes: Secondary | ICD-10-CM

## 2022-10-10 DIAGNOSIS — Z23 Encounter for immunization: Secondary | ICD-10-CM | POA: Diagnosis not present

## 2022-10-10 DIAGNOSIS — E7800 Pure hypercholesterolemia, unspecified: Secondary | ICD-10-CM

## 2022-10-10 DIAGNOSIS — G43C1 Periodic headache syndromes in child or adult, intractable: Secondary | ICD-10-CM

## 2022-10-10 DIAGNOSIS — F32A Depression, unspecified: Secondary | ICD-10-CM

## 2022-10-10 DIAGNOSIS — E66811 Obesity, class 1: Secondary | ICD-10-CM

## 2022-10-10 DIAGNOSIS — E6609 Other obesity due to excess calories: Secondary | ICD-10-CM

## 2022-10-10 DIAGNOSIS — F419 Anxiety disorder, unspecified: Secondary | ICD-10-CM

## 2022-10-10 DIAGNOSIS — K8689 Other specified diseases of pancreas: Secondary | ICD-10-CM

## 2022-10-10 DIAGNOSIS — F5101 Primary insomnia: Secondary | ICD-10-CM

## 2022-10-10 DIAGNOSIS — G4730 Sleep apnea, unspecified: Secondary | ICD-10-CM

## 2022-10-10 NOTE — Assessment & Plan Note (Signed)
Avoid foods that trigger reflux Congratulated her on 50 pound weight loss Continue omeprazole as needed

## 2022-10-10 NOTE — Assessment & Plan Note (Signed)
She will continue Ozempic for weight loss, congratulated her on 50 pound weight loss We will monitor

## 2022-10-10 NOTE — Assessment & Plan Note (Signed)
C-Met and lipid profile today Encouraged her to consume low-fat diet Continue atorvastatin 

## 2022-10-10 NOTE — Assessment & Plan Note (Signed)
CBC today.  

## 2022-10-10 NOTE — Assessment & Plan Note (Signed)
Stable on fluoxetine, bupropion, buspirone and hydroxyzine Support offered

## 2022-10-10 NOTE — Progress Notes (Signed)
Subjective:    Patient ID: Anne Mcguire, female    DOB: 1971/10/31, 50 y.o.   MRN: [id number]  HPI  Patient presents to clinic today for 25-monthfollow-up of chronic conditions.  HTN: Her BP today is 130/84.  She is taking Lisinopril HCT as prescribed.  ECG from 03/2022 reviewed.  Anxiety and Depression: Chronic, managed on Fluoxetine, Bupropion, Buspirone and Hydroxyzine.  She is not currently seeing a therapist.  She denies SI/HI.  Insomnia: She has difficulty falling asleep.  She is not taking any medications for this at this time.  Sleep study from 08/2018 reviewed.  GERD: History of PUD.  Triggered by spicy foods, sausage and tomato-based foods.  She takes Omeprazole as needed.  Upper GI from 03/2022 reviewed.  HLD: Her last LDL was 95, triglycerides 145, 03/2022.  She denies myalgias on Atorvastatin.  She tries to consume a low-fat diet.  Hypothyroidism: She denies any issues on her current dose of Levothyroxine.  She does not follow with endocrinology.  Migraines: These occur rarely.  She takes Excedrin Migraine as needed with good relief of symptoms.  She does not follow with neurology.  Anemia: Her last H/H was 11.9/37.4, 03/2022.  She is not taking any oral Iron at this time.  She does not follow with hematology.  Prediabetes: Her last A1c was 5.7%, 05/2022.  She is taking Ozempic as prescribed.  She does not check her sugars.  OSA: She averages 7 hours of sleep per night without the use of her CPAP.  Sleep study from 08/2018 reviewed.  Review of Systems     Past Medical History:  Diagnosis Date   Anxiety 06/01/2021   COVID 12/2020   headache, fever, stuffy nose, loss of taste and smell x few weeks all sx resolved except taste is altered   Depression 06/01/2021   Gastric ulcer    yrs ago no current problems per pt on 06-01-2021   GERD (gastroesophageal reflux disease) 06/01/2021   Hyperlipidemia 06/01/2021   Hypertension 06/01/2021   Hypothyroidism 06/01/2021    Menorrhagia 06/01/2021   Migraines 06/01/2021   regulae headaches also @ times   Nocturia 06/01/2021   Plantar fasciitis 06/01/2021   both feet right worse than left   Prediabetes    Sleep apnea 06/01/2021   cpap severe osa does not know cpap settings   SUI (stress urinary incontinence, female) 06/01/2021   Wears glasses 06/01/2021    Current Outpatient Medications  Medication Sig Dispense Refill   atorvastatin (LIPITOR) 10 MG tablet TAKE 1 TABLET BY MOUTH ONCE DAILY 90 tablet 3   buPROPion (WELLBUTRIN SR) 150 MG 12 hr tablet Take 1 tablet (150 mg total) by mouth daily. 90 tablet 0   busPIRone (BUSPAR) 10 MG tablet TAKE 1 TABLET BY MOUTH 3 TIMES DAILY 270 tablet 0   FLUoxetine (PROZAC) 40 MG capsule TAKE 1 CAPSULE BY MOUTH ONCE DAILY 90 capsule 1   FLUoxetine (PROZAC) 40 MG capsule Take 1 capsule (40 mg total) by mouth daily. 90 capsule 1   hydrOXYzine (ATARAX) 10 MG tablet Take 1 tablet (10 mg total) by mouth daily as needed. 30 tablet 0   levothyroxine (SYNTHROID) 100 MCG tablet TAKE 1 TABLET BY MOUTH EVERY MORNING WITH BREAKFAST 90 tablet 0   lisinopril-hydrochlorothiazide (ZESTORETIC) 20-12.5 MG tablet TAKE 1 TABLET BY MOUTH ONCE A DAY 90 tablet 1   omeprazole (PRILOSEC) 20 MG capsule Take 1 capsule (20 mg total) by mouth daily. 90 capsule 0   OZEMPIC, 1  MG/DOSE, 4 MG/3ML SOPN Inject 1 mg into the skin once a week. 9 mL 1   No current facility-administered medications for this visit.    Allergies  Allergen Reactions   Azithromycin Hives    Family History  Problem Relation Age of Onset   Mental illness Mother    Arthritis Father    Hyperlipidemia Father    Hypertension Father    Diabetes Father    Arthritis Maternal Grandmother    Diabetes Maternal Grandmother    Arthritis Maternal Grandfather    Diabetes Maternal Grandfather    Arthritis Paternal Grandmother    Breast cancer Paternal Grandmother     Social History   Socioeconomic History   Marital status:  Married    Spouse name: Not on file   Number of children: Not on file   Years of education: Not on file   Highest education level: Not on file  Occupational History   Not on file  Tobacco Use   Smoking status: Never   Smokeless tobacco: Never  Vaping Use   Vaping Use: Never used  Substance and Sexual Activity   Alcohol use: Yes    Comment: occasional   Drug use: No   Sexual activity: Yes  Other Topics Concern   Not on file  Social History Narrative   Not on file   Social Determinants of Health   Financial Resource Strain: Not on file  Food Insecurity: Not on file  Transportation Needs: Not on file  Physical Activity: Not on file  Stress: Not on file  Social Connections: Not on file  Intimate Partner Violence: Not on file     Constitutional: Patient reports intermittent headaches.  Denies fever, malaise, fatigue, headache or abrupt weight changes.  HEENT: Denies eye pain, eye redness, ear pain, ringing in the ears, wax buildup, runny nose, nasal congestion, bloody nose, or sore throat. Respiratory: Denies difficulty breathing, shortness of breath, cough or sputum production.   Cardiovascular: Denies chest pain, chest tightness, palpitations or swelling in the hands or feet.  Gastrointestinal: Denies abdominal pain, bloating, constipation, diarrhea or blood in the stool.  GU: Denies urgency, frequency, pain with urination, burning sensation, blood in urine, odor or discharge. Musculoskeletal: Denies decrease in range of motion, difficulty with gait, muscle pain or joint pain and swelling.  Skin: Denies redness, rashes, lesions or ulcercations.  Neurological: Patient reports insomnia.  Denies dizziness, difficulty with memory, difficulty with speech or problems with balance and coordination.  Psych: Patient has a history of anxiety and depression.  Denies SI/HI.  No other specific complaints in a complete review of systems (except as listed in HPI above).  Objective:    Physical Exam   BP 130/84 (BP Location: Right Arm, Patient Position: Sitting, Cuff Size: Normal)   Pulse 79   Temp (!) 97.1 F (36.2 C) (Temporal)   Wt 202 lb (91.6 kg)   LMP 05/15/2021   SpO2 99%   BMI 34.67 kg/m   Wt Readings from Last 3 Encounters:  06/18/22 220 lb (99.8 kg)  05/02/22 232 lb (105.2 kg)  04/10/22 233 lb 9.6 oz (106 kg)    General: Appears her stated age, obese, in NAD. Skin: Warm, dry and intact.  HEENT: Head: normal shape and size; Eyes: sclera white, no icterus, conjunctiva pink, PERRLA and EOMs intact;  Neck:  Neck supple, trachea midline. No masses, lumps or thyromegaly present.  Cardiovascular: Normal rate and rhythm. S1,S2 noted.  No murmur, rubs or gallops noted. No JVD  or BLE edema. No carotid bruits noted. Pulmonary/Chest: Normal effort and positive vesicular breath sounds. No respiratory distress. No wheezes, rales or ronchi noted.  Abdomen: Normal bowel sounds.  Musculoskeletal: Normal external rotation of right shoulder.  Decreased internal rotation of the right shoulder.  Positive drop can test on the right.  Pain with palpation over the [employer] joint of the right shoulder.  Strength 5/5 BUE.  No difficulty with gait.  Neurological: Alert and oriented.  Psychiatric: Mood and affect normal. Behavior is normal. Judgment and thought content normal.   BMET    Component Value Date/Time   NA 138 03/14/2022 1320   K 3.4 (L) 03/14/2022 1320   CL 104 03/14/2022 1320   CO2 27 03/14/2022 1320   GLUCOSE 109 (H) 03/14/2022 1320   BUN 15 03/14/2022 1320   CREATININE 0.70 03/14/2022 1320   CREATININE 0.77 09/27/2021 0850   CALCIUM 8.3 (L) 03/14/2022 1320   GFRNONAA >60 03/14/2022 1320    Lipid Panel     Component Value Date/Time   CHOL 177 03/28/2022 0835   TRIG 145 03/28/2022 0835   HDL 57 03/28/2022 0835   CHOLHDL 3.1 03/28/2022 0835   VLDL 21.4 03/25/2020 0749   LDLCALC 95 03/28/2022 0835    CBC    Component Value Date/Time   WBC 9.7  03/15/2022 0358   RBC 4.06 03/15/2022 0358   HGB 11.9 (L) 03/15/2022 0358   HCT 37.4 03/15/2022 0358   PLT 151 03/15/2022 0358   MCV 92.1 03/15/2022 0358   MCH 29.3 03/15/2022 0358   MCHC 31.8 03/15/2022 0358   RDW 14.1 03/15/2022 0358   LYMPHSABS 1.5 03/14/2022 0002   MONOABS 0.7 03/14/2022 0002   EOSABS 0.1 03/14/2022 0002   BASOSABS 0.0 03/14/2022 0002    Hgb A1C Lab Results  Component Value Date   HGBA1C 5.7 (H) 03/28/2022           Assessment & Plan:   Pancreatic Mass:  We will obtain MRI with and without contrast per radiology recommendation  Chronic Right Shoulder Pain:  Concern for rotator cuff tear X-ray right shoulder today Consider referral for PT versus orthopedics pending x-ray results RTC in 6 months for your annual exam Webb Silversmith, NP

## 2022-10-10 NOTE — Patient Instructions (Signed)

## 2022-10-10 NOTE — Assessment & Plan Note (Signed)
TSH and free T4 today We will adjust levothyroxine if needed based on labs 

## 2022-10-10 NOTE — Assessment & Plan Note (Signed)
Controlled on lisinopril HCT Reinforced DASH diet and exercise for weight loss C-Met today 

## 2022-10-10 NOTE — Assessment & Plan Note (Signed)
Currently not an issue °We will monitor °

## 2022-10-10 NOTE — Assessment & Plan Note (Signed)
A1c today Continue Ozempic Encouraged diet and exercise for weight loss

## 2022-10-10 NOTE — Assessment & Plan Note (Signed)
Congratulated her on 50 pound weight loss Encourage compliance with CPAP

## 2022-10-10 NOTE — Assessment & Plan Note (Signed)
Try to identify and avoid triggers Continue Excedrin Migraine as needed

## 2022-10-11 LAB — COMPLETE METABOLIC PANEL WITH GFR
AG Ratio: 1.8 (calc) (ref 1.0–2.5)
AG Ratio: 1.9 (calc) (ref 1.0–2.5)
ALT: 14 U/L (ref 6–29)
ALT: 14 U/L (ref 6–29)
AST: 13 U/L (ref 10–35)
AST: 14 U/L (ref 10–35)
Albumin: 4.2 g/dL (ref 3.6–5.1)
Albumin: 4.3 g/dL (ref 3.6–5.1)
Alkaline phosphatase (APISO): 54 U/L (ref 37–153)
Alkaline phosphatase (APISO): 55 U/L (ref 37–153)
BUN: 17 mg/dL (ref 7–25)
BUN: 17 mg/dL (ref 7–25)
CO2: 27 mmol/L (ref 20–32)
CO2: 27 mmol/L (ref 20–32)
Calcium: 9 mg/dL (ref 8.6–10.4)
Calcium: 9.1 mg/dL (ref 8.6–10.4)
Chloride: 104 mmol/L (ref 98–110)
Chloride: 104 mmol/L (ref 98–110)
Creat: 0.77 mg/dL (ref 0.50–1.03)
Creat: 0.78 mg/dL (ref 0.50–1.03)
Globulin: 2.4 g/dL (calc) (ref 1.9–3.7)
Globulin: 2.3 g/dL (calc) (ref 1.9–3.7)
Glucose, Bld: 85 mg/dL (ref 65–99)
Glucose, Bld: 83 mg/dL (ref 65–99)
Potassium: 3.8 mmol/L (ref 3.5–5.3)
Potassium: 3.8 mmol/L (ref 3.5–5.3)
Sodium: 140 mmol/L (ref 135–146)
Sodium: 140 mmol/L (ref 135–146)
Total Bilirubin: 0.5 mg/dL (ref 0.2–1.2)
Total Bilirubin: 0.5 mg/dL (ref 0.2–1.2)
Total Protein: 6.6 g/dL (ref 6.1–8.1)
Total Protein: 6.6 g/dL (ref 6.1–8.1)
eGFR: 94 mL/min/{1.73_m2} (ref >=60)
eGFR: 92 mL/min/{1.73_m2} (ref >=60)

## 2022-10-11 LAB — CBC
HCT: 40.6 % (ref 35.0–45.0)
Hemoglobin: 13.2 g/dL (ref 11.7–15.5)
MCH: 30.5 pg (ref 27.0–33.0)
MCHC: 32.5 g/dL (ref 32.0–36.0)
MCV: 93.8 fL (ref 80.0–100.0)
MPV: 12.7 fL — ABNORMAL HIGH (ref 7.5–12.5)
Platelets: 159 10*3/uL (ref 140–400)
RBC: 4.33 10*6/uL (ref 3.80–5.10)
RDW: 13 % (ref 11.0–15.0)
WBC: 6.8 10*3/uL (ref 3.8–10.8)

## 2022-10-11 LAB — HEMOGLOBIN A1C
Hgb A1c MFr Bld: 5.5 % of total Hgb (ref <5.7)
Mean Plasma Glucose: 111 mg/dL
eAG (mmol/L): 6.2 mmol/L

## 2022-10-11 LAB — LIPID PANEL
Cholesterol: 131 mg/dL (ref <200)
HDL: 49 mg/dL — ABNORMAL LOW (ref >=50)
LDL Cholesterol (Calc): 59 mg/dL (calc)
Non-HDL Cholesterol (Calc): 82 mg/dL (calc) (ref <130)
Total CHOL/HDL Ratio: 2.7 (calc) (ref <5.0)
Triglycerides: 142 mg/dL (ref <150)

## 2022-10-11 LAB — TSH: TSH: 0.31 mIU/L — ABNORMAL LOW

## 2022-10-11 LAB — T4, FREE: Free T4: 1.3 ng/dL (ref 0.8–1.8)

## 2022-10-12 ENCOUNTER — Encounter: Payer: Self-pay | Admitting: Internal Medicine

## 2022-10-15 ENCOUNTER — Ambulatory Visit
Admission: RE | Admit: 2022-10-15 | Discharge: 2022-10-15 | Disposition: A | Discharge status: Home / Self Care | Payer: Federal, State, Local not specified - PPO | Source: Ambulatory Visit | Attending: Internal Medicine | Admitting: Internal Medicine

## 2022-10-15 DIAGNOSIS — Z9049 Acquired absence of other specified parts of digestive tract: Secondary | ICD-10-CM | POA: Diagnosis not present

## 2022-10-15 DIAGNOSIS — K862 Cyst of pancreas: Secondary | ICD-10-CM | POA: Diagnosis not present

## 2022-10-15 DIAGNOSIS — K8689 Other specified diseases of pancreas: Secondary | ICD-10-CM | POA: Insufficient documentation

## 2022-10-15 MED ORDER — GADOBUTROL 1 MMOL/ML IV SOLN
9.0000 mL | Freq: Once | INTRAVENOUS | Status: AC | PRN
  Administered 2022-10-15: 9 mL via INTRAVENOUS

## 2022-10-24 NOTE — Progress Notes (Unsigned)
Patient ID: Anne Mcguire, female   DOB: 1971-11-27, 51 y.o.   MRN: [id number]  Chief Complaint: Follow-up MRI pancreatic lesion  History of Present Illness Anne Mcguire is a 51 y.o. female with no complaints of epigastric pain, GI disturbance, back pain, weight loss, etc.  This is a 70-monthfollow-up scan for incidental finding of pancreatic lesions.  She has no complaints to speak of, somewhat anxious regarding the meaning of the findings.  Past Medical History Past Medical History:  Diagnosis Date   Anxiety 06/01/2021   COVID 12/2020   headache, fever, stuffy nose, loss of taste and smell x few weeks all sx resolved except taste is altered   Depression 06/01/2021   Gastric ulcer    yrs ago no current problems per pt on 06-01-2021   GERD (gastroesophageal reflux disease) 06/01/2021   Hyperlipidemia 06/01/2021   Hypertension 06/01/2021   Hypothyroidism 06/01/2021   Menorrhagia 06/01/2021   Migraines 06/01/2021   regulae headaches also @ times   Nocturia 06/01/2021   Plantar fasciitis 06/01/2021   both feet right worse than left   Prediabetes    Sleep apnea 06/01/2021   cpap severe osa does not know cpap settings   SUI (stress urinary incontinence, female) 06/01/2021   Wears glasses 06/01/2021      Past Surgical History:  Procedure Laterality Date   ABDOMINAL HYSTERECTOMY     BLADDER SUSPENSION N/A 06/07/2021   Procedure: TRANSVAGINAL TAPE (TVT) PROCEDURE;  Surgeon: MLinda Hedges DO;  [facility]  Service: Gynecology;  Laterality: N/A;   CATARACT EXTRACTION, BILATERAL  07/11/2017   COLONOSCOPY WITH PROPOFOL N/A 06/18/2022   Procedure: COLONOSCOPY WITH BIOPSY;  Surgeon: WLucilla Lame MD;  [place] [facility]  Service: Endoscopy;  Laterality: N/A;   DILITATION & CURRETTAGE/HYSTROSCOPY WITH NOVASURE ABLATION N/A 01/10/2017   Procedure: DILATATION & CURETTAGE/HYSTEROSCOPY WITH NOVASURE ABLATION;  Surgeon: MLinda Hedges DO;  [place]  [facility]  Service: Gynecology;  Laterality: N/A;   ERCP N/A 03/15/2022   Procedure: ENDOSCOPIC RETROGRADE CHOLANGIOPANCREATOGRAPHY (ERCP);  Surgeon: WLucilla Lame MD;  [facility];  Service: Endoscopy;  Laterality: N/A;   EYE SURGERY     LAPAROSCOPIC TUBAL LIGATION Bilateral 01/10/2017   Procedure: LAPAROSCOPIC TUBAL LIGATION with Filshie Clips;  Surgeon: MLinda Hedges DO;  [place] [facility]  Service: Gynecology;  Laterality: Bilateral;   LAPAROSCOPIC VAGINAL HYSTERECTOMY WITH SALPINGECTOMY Bilateral 06/07/2021   Procedure: LAPAROSCOPIC ASSISTED VAGINAL HYSTERECTOMY WITH BILATERAL SALPINGECTOMY;  Surgeon: MLinda Hedges DO;  [place] [facility]  Service: Gynecology;  Laterality: Bilateral;   POLYPECTOMY N/A 06/18/2022   Procedure: POLYPECTOMY;  Surgeon: WLucilla Lame MD;  [place] [facility]  Service: Endoscopy;  Laterality: N/A;   TUBAL LIGATION     WISDOM TOOTH EXTRACTION  2013    Allergies  Allergen Reactions   Azithromycin Hives    Current Outpatient Medications  Medication Sig Dispense Refill   atorvastatin (LIPITOR) 10 MG tablet TAKE 1 TABLET BY MOUTH ONCE DAILY 90 tablet 3   buPROPion (WELLBUTRIN SR) 150 MG 12 hr tablet Take 1 tablet (150 mg total) by mouth daily. 90 tablet 0   busPIRone (BUSPAR) 10 MG tablet TAKE 1 TABLET BY MOUTH 3 TIMES DAILY 270 tablet 0   FLUoxetine (PROZAC) 40 MG capsule TAKE 1 CAPSULE BY MOUTH ONCE DAILY 90 capsule 1   hydrOXYzine (ATARAX) 10 MG tablet Take 1 tablet (10 mg total) by mouth daily as needed. 30 tablet 0  levothyroxine (SYNTHROID) 100 MCG tablet TAKE 1 TABLET BY MOUTH EVERY MORNING WITH BREAKFAST 90 tablet 0   lisinopril-hydrochlorothiazide (ZESTORETIC) 20-12.5 MG tablet TAKE 1 TABLET BY MOUTH ONCE A DAY 90 tablet 1   omeprazole (PRILOSEC) 20 MG capsule Take 1 capsule (20 mg total) by mouth daily. 90 capsule 0   OZEMPIC, 1 MG/DOSE, 4 MG/3ML SOPN Inject 1 mg into the  skin once a week. 9 mL 1   No current facility-administered medications for this visit.    Family History Family History  Problem Relation Age of Onset   Mental illness Mother    Arthritis Father    Hyperlipidemia Father    Hypertension Father    Diabetes Father    Arthritis Maternal Grandmother    Diabetes Maternal Grandmother    Arthritis Maternal Grandfather    Diabetes Maternal Grandfather    Arthritis Paternal Grandmother    Breast cancer Paternal Grandmother       Social History Social History   Tobacco Use   Smoking status: Never   Smokeless tobacco: Never  Vaping Use   Vaping Use: Never used  Substance Use Topics   Alcohol use: Yes    Comment: occasional   Drug use: No    Physical Exam Last menstrual period 05/15/2021.   CONSTITUTIONAL: Well developed, and nourished, appropriately responsive and aware without distress.   EYES: Sclera non-icteric.   RESPIRATORY:  Normal respiratory effort without pathologic use of accessory muscles. NEUROLOGIC:  Motor and sensation appear grossly normal.  Cranial nerves are grossly without defect. PSYCH:  Alert and oriented to person, place and time. Affect is appropriate for situation.  Data Reviewed I have personally reviewed what is currently available of the patient's imaging, recent labs and medical records.   Labs:     Latest Ref Rng & Units 10/10/2022    8:32 AM 03/15/2022    3:58 AM 03/14/2022   12:02 AM  CBC  WBC 3.8 - 10.8 Thousand/uL 6.8  9.7  11.0   Hemoglobin 11.7 - 15.5 g/dL 13.2  11.9  13.3   Hematocrit 35.0 - 45.0 % 40.6  37.4  42.0   Platelets 140 - 400 Thousand/uL 159  151  184       Latest Ref Rng & Units 10/10/2022    8:32 AM 10/10/2022    8:26 AM 03/15/2022    8:51 AM  CMP  Glucose 65 - 99 mg/dL 83  85    BUN 7 - 25 mg/dL 17  17    Creatinine 0.50 - 1.03 mg/dL 0.78  0.77    Sodium 135 - 146 mmol/L 140  140    Potassium 3.5 - 5.3 mmol/L 3.8  3.8    Chloride 98 - 110 mmol/L 104  104    CO2 20 -  32 mmol/L 27  27    Calcium 8.6 - 10.4 mg/dL 9.1  9.0    Total Protein 6.1 - 8.1 g/dL 6.6  6.6  6.3   Total Bilirubin 0.2 - 1.2 mg/dL 0.5  0.5  0.7   Alkaline Phos 38 - 126 U/L   56   AST 10 - 35 U/L '14  13  15   '$ ALT 6 - 29 U/L '14  14  14       '$ Imaging: Radiological images reviewed:   CLINICAL DATA:  Follow-up of pancreatic neck cystic lesion. Asymptomatic.   EXAM: MRI ABDOMEN WITHOUT AND WITH CONTRAST   TECHNIQUE: Multiplanar multisequence MR imaging of the abdomen  was performed both before and after the administration of intravenous contrast.   CONTRAST:  65m GADAVIST GADOBUTROL 1 MMOL/ML IV SOLN   COMPARISON:  03/14/2022   FINDINGS: Lower chest: Normal heart size without pericardial or pleural effusion.   Hepatobiliary: Arterial phase hyperenhancement within the right hepatic lobe measures less than a cm on 18/19 is likely a perfusion anomaly.   Hypoenhancement adjacent the falciform ligament is also likely related to altered perfusion. No suspicious liver lesion. Interval cholecystectomy, without biliary duct dilatation or choledocholithiasis.   Pancreas: Again identified is a cystic lesion within the ventral pancreatic head. This measures 1.8 x 1.1 cm transverse on 16/4. Compare 2.0 x 1.2 cm when remeasured in a similar fashion on the prior. 1.5 cm craniocaudal on 20/3 versus similar on the prior (when remeasured). Abuts but does not definitely communicate with the pancreatic duct. Demonstrates suggestion of mild postcontrast enhancement, possibly within thin septa.   No duct dilatation or acute inflammation.   Spleen:  Normal in size, without focal abnormality.   Adrenals/Urinary Tract: Normal adrenal glands. Normal kidneys, without hydronephrosis.   Stomach/Bowel: Normal stomach and abdominal bowel loops.   Vascular/Lymphatic: Normal caliber of the aorta and branch vessels. No retroperitoneal or retrocrural adenopathy.   Other:  No ascites.    Musculoskeletal: No acute osseous abnormality.   IMPRESSION: 1. Similar to minimal decrease in size of a cystic lesion within the ventral pancreatic head. Considerations include pseudocyst versus indolent cystic neoplasm such as intraductal papillary mucinous neoplasm or serous cystadenoma. Per consensus criteria follow-up with pre and post contrast abdominal MRI/MRCP at 6 months is recommended. This recommendation follows ACR consensus guidelines: Management of Incidental Pancreatic Cysts: A White Paper of the ACR Incidental Findings Committee. [id number] 2. Interval cholecystectomy.     Electronically Signed   By: KAbigail MiyamotoM.D.   On: 10/18/2022 09:52 Within last 24 hrs: No results found.  Assessment    Pancreatic cystic lesions stable to diminished size over 612-monthnterval. Patient Active Problem List   Diagnosis Date Noted   Iron deficiency anemia due to chronic blood loss 09/27/2021   Prediabetes 09/27/2021   Class 1 obesity due to excess calories with body mass index (BMI) of 34.0 to 34.9 in adult 09/27/2021   Severe sleep apnea 01/18/2021   HLD (hyperlipidemia) 08/26/2017   Migraines 08/26/2017   Essential hypertension 01/28/2017   Insomnia 04/02/2014   Anxiety and depression 02/19/2014   Hypothyroidism 02/19/2014   GERD (gastroesophageal reflux disease) 02/19/2014    Plan    We discussed the options of continued radiologic follow-up, versus early referral to pancreatic surgeon.  I reminded her that I do not do pancreatic surgery, but feel it is prudent to continue to monitor these lesions at this time and not become overly aggressive and set further morbidity of an unnecessary procedure.  We discussed potential core biopsy/obtained percutaneously and the limits thereof.  I reminded I be glad to refer her to a tertiary care center where an oncologic surgeon may be of greater experience following these lesions.  But I believe it is prudent at  this time to continue our 6-27-monthllow-up with MRI imaging.  We will anticipate seeing her back with a 6-m66-month and postcontrast MRCP follow-up.  Face-to-face time spent with the patient and accompanying care providers(if present) was 20 minutes, with more than 50% of the time spent counseling, educating, and coordinating care of the patient.    These notes generated with voice  recognition software. I apologize for typographical errors.  Ronny Bacon M.D., FACS 10/25/2022, 10:37 AM

## 2022-10-25 ENCOUNTER — Encounter: Payer: Self-pay | Admitting: Surgery

## 2022-10-25 ENCOUNTER — Ambulatory Visit: Payer: Federal, State, Local not specified - PPO | Admitting: Surgery

## 2022-10-25 ENCOUNTER — Other Ambulatory Visit: Payer: Self-pay

## 2022-10-25 DIAGNOSIS — K862 Cyst of pancreas: Secondary | ICD-10-CM | POA: Insufficient documentation

## 2022-10-25 NOTE — Patient Instructions (Addendum)
Please call the office if you have any questions or concerns.  We will contact you in 6 months to schedule the MRI May 2024. If you do not hear from our office by the end of April 2024 please call.

## 2022-11-07 ENCOUNTER — Other Ambulatory Visit: Payer: Self-pay | Admitting: Internal Medicine

## 2022-11-07 DIAGNOSIS — F419 Anxiety disorder, unspecified: Secondary | ICD-10-CM

## 2022-11-07 NOTE — Telephone Encounter (Signed)
Requested Prescriptions  Pending Prescriptions Disp Refills   buPROPion (WELLBUTRIN SR) 150 MG 12 hr tablet [Pharmacy Med Name: BUPROPION HCL ER (SR) 150 MG TAB] 90 tablet 0    Sig: TAKE 1 TABLET BY MOUTH ONCE A DAY     Psychiatry: Antidepressants - bupropion Passed - 11/07/2022  4:11 PM      Passed - Cr in normal range and within 360 days    Creat  Date Value Ref Range Status  10/10/2022 0.78 0.50 - 1.03 mg/dL Final         Passed - AST in normal range and within 360 days    AST  Date Value Ref Range Status  10/10/2022 14 10 - 35 U/L Final         Passed - ALT in normal range and within 360 days    ALT  Date Value Ref Range Status  10/10/2022 14 6 - 29 U/L Final         Passed - Completed PHQ-2 or PHQ-9 in the last 360 days      Passed - Last BP in normal range    BP Readings from Last 1 Encounters:  10/10/22 130/84         Passed - Valid encounter within last 6 months    Recent Outpatient Visits           4 weeks ago Prediabetes   [place], Coralie Keens, NP   6 months ago Prediabetes   [facility] [place], Coralie Keens, NP   7 months ago Encounter for general adult medical examination with abnormal findings   [facility] [place], Coralie Keens, NP   7 months ago Choledocholithiasis   [facility] [place], Coralie Keens, NP   1 year ago Prediabetes   [facility] [place], Coralie Keens, NP

## 2022-11-21 ENCOUNTER — Other Ambulatory Visit: Payer: Self-pay | Admitting: Internal Medicine

## 2022-11-22 NOTE — Telephone Encounter (Signed)
Requested Prescriptions  Pending Prescriptions Disp Refills   OZEMPIC, 1 MG/DOSE, 4 MG/3ML SOPN [Pharmacy Med Name: OZEMPIC '1MG'$   PEN '4MG'$ /3ML] 9 mL 0    Sig: INJECT '1MG'$  SUBCUTANEOUSLY  ONCE A WEEK     Endocrinology:  Diabetes - GLP-1 Receptor Agonists - semaglutide Passed - 11/21/2022  7:29 PM      Passed - HBA1C in normal range and within 180 days    Hgb A1c MFr Bld  Date Value Ref Range Status  10/10/2022 5.5 <5.7 % of total Hgb Final    Comment:    For the purpose of screening for the presence of diabetes: . <5.7%       Consistent with the absence of diabetes 5.7-6.4%    Consistent with increased risk for diabetes             (prediabetes) > or =6.5%  Consistent with diabetes . This assay result is consistent with a decreased risk of diabetes. . Currently, no consensus exists regarding use of hemoglobin A1c for diagnosis of diabetes in children. . According to American Diabetes Association (ADA) guidelines, hemoglobin A1c <7.0% represents optimal control in non-pregnant diabetic patients. Different metrics may apply to specific patient populations.  Standards of Medical Care in Diabetes(ADA). .          Passed - Cr in normal range and within 360 days    Creat  Date Value Ref Range Status  10/10/2022 0.78 0.50 - 1.03 mg/dL Final         Passed - Valid encounter within last 6 months    Recent Outpatient Visits           1 month ago Prediabetes   [facility] [place], Coralie Keens, NP   6 months ago Prediabetes   [facility] [place], Coralie Keens, NP   7 months ago Encounter for general adult medical examination with abnormal findings   [facility] [place], Coralie Keens, NP   8 months ago Choledocholithiasis   [facility] [place], Coralie Keens, NP   1 year ago Prediabetes   [facility] [place], Coralie Keens, NP

## 2022-11-30 ENCOUNTER — Other Ambulatory Visit: Payer: Self-pay | Admitting: Internal Medicine

## 2022-12-02 ENCOUNTER — Other Ambulatory Visit: Payer: Self-pay | Admitting: Internal Medicine

## 2022-12-04 NOTE — Telephone Encounter (Signed)
Requested Prescriptions  Pending Prescriptions Disp Refills   lisinopril-hydrochlorothiazide (ZESTORETIC) 20-12.5 MG tablet [Pharmacy Med Name: LISINOPRIL-HCTZ 20-12.5 MG TAB] 90 tablet 1    Sig: TAKE 1 TABLET BY MOUTH ONCE A DAY     Cardiovascular:  ACEI + Diuretic Combos Passed - 11/30/2022  4:59 PM      Passed - Na in normal range and within 180 days    Sodium  Date Value Ref Range Status  10/10/2022 140 135 - 146 mmol/L Final         Passed - K in normal range and within 180 days    Potassium  Date Value Ref Range Status  10/10/2022 3.8 3.5 - 5.3 mmol/L Final         Passed - Cr in normal range and within 180 days    Creat  Date Value Ref Range Status  10/10/2022 0.78 0.50 - 1.03 mg/dL Final         Passed - eGFR is 30 or above and within 180 days    GFR, Estimated  Date Value Ref Range Status  03/14/2022 >60 >60 mL/min Final    Comment:    (NOTE) Calculated using the CKD-EPI Creatinine Equation (2021)    GFR  Date Value Ref Range Status  03/09/2021 86.70 >60.00 mL/min Final    Comment:    Calculated using the CKD-EPI Creatinine Equation (2021)   eGFR  Date Value Ref Range Status  10/10/2022 92 > OR = 60 mL/min/1.61m Final         Passed - Patient is not pregnant      Passed - Last BP in normal range    BP Readings from Last 1 Encounters:  10/10/22 130/84         Passed - Valid encounter within last 6 months    Recent Outpatient Visits           1 month ago Prediabetes   [facility]   7 months ago [facility]   [facility]   8 months ago Encounter for general adult medical examination with abnormal findings   [facility]   8 months ago Choledocholithiasis   [facility]   1 year ago Prediabetes   [facility]

## 2022-12-06 ENCOUNTER — Other Ambulatory Visit: Payer: Self-pay | Admitting: Internal Medicine

## 2022-12-09 ENCOUNTER — Encounter: Payer: Self-pay | Admitting: Internal Medicine

## 2022-12-11 ENCOUNTER — Encounter: Payer: Self-pay | Admitting: Internal Medicine

## 2022-12-12 ENCOUNTER — Other Ambulatory Visit: Payer: Self-pay | Admitting: Internal Medicine

## 2022-12-13 MED ORDER — SCOPOLAMINE 1 MG/3DAYS TD PT72: 1.0000 | MEDICATED_PATCH | TRANSDERMAL | 0 refills | Status: DC

## 2022-12-13 NOTE — Telephone Encounter (Signed)
Requested Prescriptions  Pending Prescriptions Disp Refills   busPIRone (BUSPAR) 10 MG tablet [Pharmacy Med Name: BUSPIRONE HCL 10 MG TAB] 270 tablet 1    Sig: TAKE 1 TABLET BY MOUTH 3 TIMES DAILY     Psychiatry: Anxiolytics/Hypnotics - Non-controlled Passed - 12/12/2022  2:36 PM      Passed - Valid encounter within last 12 months    Recent Outpatient Visits           2 months ago Prediabetes   [facility] [place], Coralie Keens, NP   7 months ago Prediabetes   [facility] [place], Coralie Keens, NP   8 months ago Encounter for general adult medical examination with abnormal findings   [facility] [place], Coralie Keens, NP   8 months ago Choledocholithiasis   [facility] [place], Coralie Keens, NP   1 year ago Prediabetes   [facility] [place], Coralie Keens, NP

## 2022-12-14 ENCOUNTER — Encounter: Payer: Self-pay | Admitting: Internal Medicine

## 2022-12-20 ENCOUNTER — Other Ambulatory Visit: Payer: Self-pay | Admitting: Internal Medicine

## 2022-12-20 NOTE — Telephone Encounter (Signed)
Requested Prescriptions  Pending Prescriptions Disp Refills   atorvastatin (LIPITOR) 10 MG tablet [Pharmacy Med Name: ATORVASTATIN CALCIUM 10 MG TAB] 90 tablet 0    Sig: TAKE 1 TABLET BY MOUTH ONCE DAILY     Cardiovascular:  Antilipid - Statins Failed - 12/20/2022  1:16 PM      Failed - Lipid Panel in normal range within the last 12 months    Cholesterol  Date Value Ref Range Status  10/10/2022 131 <200 mg/dL Final   LDL Cholesterol (Calc)  Date Value Ref Range Status  10/10/2022 59 mg/dL (calc) Final    Comment:    Reference range: <100 . Desirable range <100 mg/dL for primary prevention;   <70 mg/dL for patients with CHD or diabetic patients  with > or = 2 CHD risk factors. Marland Kitchen LDL-C is now calculated using the Martin-Hopkins  calculation, which is a validated novel method providing  better accuracy than the Friedewald equation in the  estimation of LDL-C.  Cresenciano Genre et al. Annamaria Helling. 9449;675(91): 2061-2068  (http://education.[employer].com/faq/FAQ164)    HDL  Date Value Ref Range Status  10/10/2022 49 (L) > OR = 50 mg/dL Final   Triglycerides  Date Value Ref Range Status  10/10/2022 142 <150 mg/dL Final         Passed - Patient is not pregnant      Passed - Valid encounter within last 12 months    Recent Outpatient Visits           2 months ago Prediabetes   [facility] [place], Coralie Keens, NP   7 months ago Prediabetes   [facility] [place], Coralie Keens, NP   8 months ago Encounter for general adult medical examination with abnormal findings   [facility] [place], Coralie Keens, NP   9 months ago Choledocholithiasis   [facility] [place], Coralie Keens, NP   1 year ago Prediabetes   [facility] [place], Coralie Keens, NP

## 2022-12-26 ENCOUNTER — Other Ambulatory Visit: Payer: Self-pay | Admitting: Internal Medicine

## 2022-12-26 NOTE — Telephone Encounter (Signed)
Requested Prescriptions  Pending Prescriptions Disp Refills   hydrOXYzine (ATARAX) 10 MG tablet [Pharmacy Med Name: HYDROXYZINE HCL 10 MG TAB] 90 tablet 0    Sig: TAKE 1 TABLET BY MOUTH ONCE A DAY AS NEEDED     Ear, Nose, and Throat:  Antihistamines 2 Passed - 12/26/2022  9:40 AM      Passed - Cr in normal range and within 360 days    Creat  Date Value Ref Range Status  10/10/2022 0.78 0.50 - 1.03 mg/dL Final         Passed - Valid encounter within last 12 months    Recent Outpatient Visits           2 months ago Prediabetes   [facility] [place], Coralie Keens, NP   7 months ago Prediabetes   [facility] [place], Coralie Keens, NP   9 months ago Encounter for general adult medical examination with abnormal findings   [facility] [place], Coralie Keens, NP   9 months ago Choledocholithiasis   [facility] [place], Coralie Keens, NP   1 year ago Prediabetes   [facility] [place], Coralie Keens, NP

## 2023-01-03 ENCOUNTER — Encounter: Payer: Self-pay | Admitting: Internal Medicine

## 2023-02-13 ENCOUNTER — Ambulatory Visit: Payer: Federal, State, Local not specified - PPO | Admitting: Podiatry

## 2023-02-13 ENCOUNTER — Ambulatory Visit (INDEPENDENT_AMBULATORY_CARE_PROVIDER_SITE_OTHER): Payer: Federal, State, Local not specified - PPO

## 2023-02-13 DIAGNOSIS — M722 Plantar fascial fibromatosis: Secondary | ICD-10-CM

## 2023-02-13 DIAGNOSIS — R52 Pain, unspecified: Secondary | ICD-10-CM

## 2023-02-13 MED ORDER — METHYLPREDNISOLONE 4 MG PO TBPK: ORAL_TABLET | ORAL | 0 refills | Status: DC

## 2023-02-13 MED ORDER — MELOXICAM 15 MG PO TABS: 15.0000 mg | ORAL_TABLET | Freq: Every day | ORAL | 1 refills | Status: DC

## 2023-02-13 MED ORDER — BETAMETHASONE SOD PHOS & ACET 6 (3-3) MG/ML IJ SUSP
3.0000 mg | Freq: Once | INTRAMUSCULAR | Status: AC
  Administered 2023-02-13: 3 mg via INTRA_ARTICULAR

## 2023-02-13 NOTE — Progress Notes (Signed)
Chief Complaint  Patient presents with   Plantar Fasciitis    Patient came in today for right arch pain, started 2 months ago, rate of pain 8 out of 10, sharp, shooting, patient is unable to fall asleep because of the pain, X-Rays done today     Subjective: 52 y.o. female presenting today for evaluation of a new complaint of pain regarding acute flareup to the plantar fascia of the right foot.  Patient has not been seen in the office since 09/05/2020.  She has been great until recently.  Patient has a history of plantar fasciitis.  She states that she has had right foot pain for approximately 2 months now.  She has not done anything recently for treatment.  Patient believes the pain may have been elicited by winter boots that she has been wearing.  She presents for further treatment evaluation   Past Medical History:  Diagnosis Date   Anxiety 06/01/2021   COVID 12/2020   headache, fever, stuffy nose, loss of taste and smell x few weeks all sx resolved except taste is altered   Depression 06/01/2021   Gastric ulcer    yrs ago no current problems per pt on 06-01-2021   GERD (gastroesophageal reflux disease) 06/01/2021   Hyperlipidemia 06/01/2021   Hypertension 06/01/2021   Hypothyroidism 06/01/2021   Menorrhagia 06/01/2021   Migraines 06/01/2021   regulae headaches also @ times   Nocturia 06/01/2021   Plantar fasciitis 06/01/2021   both feet right worse than left   Prediabetes    Sleep apnea 06/01/2021   cpap severe osa does not know cpap settings   SUI (stress urinary incontinence, female) 06/01/2021   Wears glasses 06/01/2021     Objective: Physical Exam General: The patient is alert and oriented x3 in no acute distress.  Dermatology: Skin is warm, dry and supple bilateral lower extremities. Negative for open lesions or macerations bilateral.   Vascular: Dorsalis Pedis and Posterior Tibial pulses palpable bilateral.  Capillary fill time is immediate to all  digits.  Neurological: Epicritic and protective threshold intact bilateral.   Musculoskeletal: Tenderness to palpation to the plantar aspect of the right plantar fascia. All other joints range of motion within normal limits bilateral. Strength 5/5 in all groups bilateral.   Assessment: 1. Plantar fasciitis right-mid substance  Plan of Care:  1. Patient evaluated.   2. Injection of 0.5cc Celestone soluspan injected into the right plantar fascia  3. Rx for Medrol Dose Pack placed 4. Rx for Meloxicam ordered for patient. 5.  Patient has had custom orthotics in the past and did not like them.  Advised against wearing the winter boots.  Recommend good supportive tennis shoes at [employer] running store  6.  Plantar fascia brace dispensed 7.  Cam boot dispensed as well.  If the pain does not resolve recommend that she is weightbearing in the cam boot for 3 weeks 8.  Return to clinic 4 weeks  *Administrative for [place] service   Fabian Coca M. Aldous Housel, DPM [facility]  Dr. Edrick Kins, DPM    [address], [place] [address]                Office [phone]  Fax [phone]

## 2023-02-14 ENCOUNTER — Encounter: Payer: Self-pay | Admitting: Internal Medicine

## 2023-02-19 ENCOUNTER — Other Ambulatory Visit: Payer: Self-pay | Admitting: Internal Medicine

## 2023-02-21 MED ORDER — ATORVASTATIN CALCIUM 10 MG PO TABS: 10.0000 mg | ORAL_TABLET | Freq: Every day | ORAL | 0 refills | Status: DC

## 2023-02-25 ENCOUNTER — Other Ambulatory Visit: Payer: Self-pay | Admitting: Internal Medicine

## 2023-02-25 DIAGNOSIS — F419 Anxiety disorder, unspecified: Secondary | ICD-10-CM

## 2023-02-25 MED ORDER — LEVOTHYROXINE SODIUM 100 MCG PO TABS: 100.0000 ug | ORAL_TABLET | Freq: Every day | ORAL | 0 refills | Status: DC

## 2023-02-25 MED ORDER — BUPROPION HCL ER (SR) 150 MG PO TB12: 150.0000 mg | ORAL_TABLET | Freq: Every day | ORAL | 0 refills | Status: DC

## 2023-02-26 ENCOUNTER — Encounter: Payer: Self-pay | Admitting: Internal Medicine

## 2023-02-26 DIAGNOSIS — E039 Hypothyroidism, unspecified: Secondary | ICD-10-CM

## 2023-02-26 NOTE — Telephone Encounter (Signed)
Requested Prescriptions  Pending Prescriptions Disp Refills   lisinopril-hydrochlorothiazide (ZESTORETIC) 20-12.5 MG tablet [Pharmacy Med Name: LISINOPRIL/HYDROCHLOROTHIAZIDE 20-12.5 TABLET] 90 tablet 1    Sig: TAKE ONE TABLET BY MOUTH ONCE A DAY     Cardiovascular:  ACEI + Diuretic Combos Passed - 02/25/2023  3:46 PM      Passed - Na in normal range and within 180 days    Sodium  Date Value Ref Range Status  10/10/2022 140 135 - 146 mmol/L Final         Passed - K in normal range and within 180 days    Potassium  Date Value Ref Range Status  10/10/2022 3.8 3.5 - 5.3 mmol/L Final         Passed - Cr in normal range and within 180 days    Creat  Date Value Ref Range Status  10/10/2022 0.78 0.50 - 1.03 mg/dL Final         Passed - eGFR is 30 or above and within 180 days    GFR, Estimated  Date Value Ref Range Status  03/14/2022 >60 >60 mL/min Final    Comment:    (NOTE) Calculated using the CKD-EPI Creatinine Equation (2021)    GFR  Date Value Ref Range Status  03/09/2021 86.70 >60.00 mL/min Final    Comment:    Calculated using the CKD-EPI Creatinine Equation (2021)   eGFR  Date Value Ref Range Status  10/10/2022 92 > OR = 60 mL/min/1.55m2 Final         Passed - Patient is not pregnant      Passed - Last BP in normal range    BP Readings from Last 1 Encounters:  10/10/22 130/84         Passed - Valid encounter within last 6 months    Recent Outpatient Visits           4 months ago Prediabetes   [facility] [place], Coralie Keens, NP   10 months ago Prediabetes   [facility] [place], Coralie Keens, NP   11 months ago Encounter for general adult medical examination with abnormal findings   [facility] [place], Coralie Keens, NP   11 months ago Choledocholithiasis   [facility] [place], Coralie Keens, NP   1 year ago [facility]  [place], Coralie Keens, [place]

## 2023-02-28 ENCOUNTER — Telehealth: Payer: Self-pay | Admitting: *Deleted

## 2023-02-28 MED ORDER — SEMAGLUTIDE-WEIGHT MANAGEMENT 0.5 MG/0.5ML ~~LOC~~ SOAJ: 0.5000 mg | SUBCUTANEOUS | 0 refills | Status: DC

## 2023-02-28 MED ORDER — LEVOTHYROXINE SODIUM 88 MCG PO TABS: 88.0000 ug | ORAL_TABLET | Freq: Every day | ORAL | 0 refills | Status: DC

## 2023-02-28 NOTE — Addendum Note (Signed)
Addended by: Jearld Fenton on: 02/28/2023 10:09 AM   Modules accepted: Orders

## 2023-02-28 NOTE — Telephone Encounter (Signed)
  Chief Complaint: Med Clarification Symptoms: NA Frequency: NA Pertinent Negatives: Patient denies NA Disposition: [] ED /[] Urgent Care (no appt availability in office) / [] Appointment(In office/virtual)/ []  [facility]/ [] Home Care/ [] Refused Recommended Disposition /[] [place] [facility]/ [x]  Follow-up with PCP Additional Notes:   Landon from [place] calling for clarification on levothyroxine dose. Advised dose decreased to 13mcg as noted on current med profile and PCPs note.  Also alerting PCP [facility] needs PA and is on a national backorder.

## 2023-03-11 ENCOUNTER — Telehealth: Payer: Federal, State, Local not specified - PPO | Admitting: Physician Assistant

## 2023-03-11 DIAGNOSIS — B9689 Other specified bacterial agents as the cause of diseases classified elsewhere: Secondary | ICD-10-CM | POA: Diagnosis not present

## 2023-03-11 DIAGNOSIS — J019 Acute sinusitis, unspecified: Secondary | ICD-10-CM

## 2023-03-11 MED ORDER — AMOXICILLIN-POT CLAVULANATE 875-125 MG PO TABS: 1.0000 | ORAL_TABLET | Freq: Two times a day (BID) | ORAL | 0 refills | Status: DC

## 2023-03-11 NOTE — Progress Notes (Signed)

## 2023-03-13 ENCOUNTER — Other Ambulatory Visit: Payer: Self-pay

## 2023-03-13 DIAGNOSIS — K862 Cyst of pancreas: Secondary | ICD-10-CM

## 2023-03-16 ENCOUNTER — Other Ambulatory Visit: Payer: Self-pay | Admitting: Internal Medicine

## 2023-03-18 NOTE — Telephone Encounter (Signed)
Requested Prescriptions  Pending Prescriptions Disp Refills   busPIRone (BUSPAR) 10 MG tablet [Pharmacy Med Name: BUSPIRONE HYDROCHLORIDE 10MG  TABLET] 270 tablet 1    Sig: TAKE ONE TABLET BY MOUTH THREE TIMES DAILY     Psychiatry: Anxiolytics/Hypnotics - Non-controlled Passed - 03/16/2023 12:02 PM      Passed - Valid encounter within last 12 months    Recent Outpatient Visits           5 months ago Prediabetes   [facility] [place], Salvadore Oxford, NP   10 months ago Prediabetes   [facility] [place], Salvadore Oxford, NP   11 months ago Encounter for general adult medical examination with abnormal findings   [facility] [place], Salvadore Oxford, NP   12 months ago Choledocholithiasis   [facility] [place], Salvadore Oxford, NP   1 year ago Prediabetes   [facility] [place], Salvadore Oxford, [place]

## 2023-03-20 ENCOUNTER — Other Ambulatory Visit: Payer: Self-pay

## 2023-03-20 ENCOUNTER — Encounter: Payer: Self-pay | Admitting: Internal Medicine

## 2023-03-20 DIAGNOSIS — E039 Hypothyroidism, unspecified: Secondary | ICD-10-CM

## 2023-03-21 ENCOUNTER — Other Ambulatory Visit: Payer: Federal, State, Local not specified - PPO

## 2023-03-21 DIAGNOSIS — E039 Hypothyroidism, unspecified: Secondary | ICD-10-CM | POA: Diagnosis not present

## 2023-03-21 LAB — TSH: TSH: 0.37 mIU/L — ABNORMAL LOW

## 2023-03-21 MED ORDER — SEMAGLUTIDE-WEIGHT MANAGEMENT 0.5 MG/0.5ML ~~LOC~~ SOAJ: 0.5000 mg | SUBCUTANEOUS | 0 refills | Status: DC

## 2023-03-22 ENCOUNTER — Encounter: Payer: Self-pay | Admitting: Internal Medicine

## 2023-04-12 ENCOUNTER — Other Ambulatory Visit: Payer: Self-pay | Admitting: Surgery

## 2023-04-12 ENCOUNTER — Ambulatory Visit
Admission: RE | Admit: 2023-04-12 | Discharge: 2023-04-12 | Disposition: A | Discharge status: Home / Self Care | Payer: Federal, State, Local not specified - PPO | Source: Ambulatory Visit | Attending: Surgery | Admitting: Surgery

## 2023-04-12 DIAGNOSIS — K862 Cyst of pancreas: Secondary | ICD-10-CM

## 2023-04-12 MED ORDER — GADOBUTROL 1 MMOL/ML IV SOLN
10.0000 mL | Freq: Once | INTRAVENOUS | Status: AC | PRN
  Administered 2023-04-12: 9 mL via INTRAVENOUS

## 2023-04-15 ENCOUNTER — Ambulatory Visit: Payer: Federal, State, Local not specified - PPO | Admitting: Podiatry

## 2023-04-25 ENCOUNTER — Ambulatory Visit: Payer: Federal, State, Local not specified - PPO | Admitting: Surgery

## 2023-04-25 ENCOUNTER — Encounter: Payer: Self-pay | Admitting: Surgery

## 2023-04-25 VITALS — BP 146/91 | HR 88 | Temp 98.2°F | Ht 64.0 in | Wt 201.0 lb

## 2023-04-25 DIAGNOSIS — K862 Cyst of pancreas: Secondary | ICD-10-CM | POA: Diagnosis not present

## 2023-04-25 NOTE — Progress Notes (Signed)
Patient ID: Anne Mcguire, female   DOB: 21-Oct-1971, 52 y.o.   MRN: [id number]  Chief Complaint: Follow-up MRI pancreatic lesion  History of Present Illness Anne Mcguire is a 52 y.o. female with no complaints of epigastric pain, GI disturbance, back pain, weight loss, etc.  This is the second 74-month follow-up MRI/scan for incidental finding of pancreatic cystic lesions.  She has no complaints to speak of.  She presents today with her husband.   Past Medical History Past Medical History:  Diagnosis Date   Anxiety 06/01/2021   COVID 12/2020   headache, fever, stuffy nose, loss of taste and smell x few weeks all sx resolved except taste is altered   Depression 06/01/2021   Gastric ulcer    yrs ago no current problems per pt on 06-01-2021   GERD (gastroesophageal reflux disease) 06/01/2021   Hyperlipidemia 06/01/2021   Hypertension 06/01/2021   Hypothyroidism 06/01/2021   Menorrhagia 06/01/2021   Migraines 06/01/2021   regulae headaches also @ times   Nocturia 06/01/2021   Plantar fasciitis 06/01/2021   both feet right worse than left   Prediabetes    Sleep apnea 06/01/2021   cpap severe osa does not know cpap settings   SUI (stress urinary incontinence, female) 06/01/2021   Wears glasses 06/01/2021      Past Surgical History:  Procedure Laterality Date   ABDOMINAL HYSTERECTOMY     BLADDER SUSPENSION N/A 06/07/2021   Procedure: TRANSVAGINAL TAPE (TVT) PROCEDURE;  Surgeon: Mitchel Honour, DO;  Location: [facility];  Service: Gynecology;  Laterality: N/A;   CATARACT EXTRACTION, BILATERAL  07/11/2017   COLONOSCOPY WITH PROPOFOL N/A 06/18/2022   Procedure: COLONOSCOPY WITH BIOPSY;  Surgeon: Midge Minium, MD;  [facility];  Service: Endoscopy;  Laterality: N/A;   DILITATION & CURRETTAGE/HYSTROSCOPY WITH NOVASURE ABLATION N/A 01/10/2017   Procedure: DILATATION & CURETTAGE/HYSTEROSCOPY WITH NOVASURE ABLATION;  Surgeon: Mitchel Honour, DO;   Location: [facility];  Service: Gynecology;  Laterality: N/A;   ERCP N/A 03/15/2022   Procedure: ENDOSCOPIC RETROGRADE CHOLANGIOPANCREATOGRAPHY (ERCP);  Surgeon: Midge Minium, MD;  [facility];  Service: Endoscopy;  Laterality: N/A;   EYE SURGERY     LAPAROSCOPIC TUBAL LIGATION Bilateral 01/10/2017   Procedure: LAPAROSCOPIC TUBAL LIGATION with Filshie Clips;  Surgeon: Mitchel Honour, DO;  Location: [facility] [place];  Service: Gynecology;  Laterality: Bilateral;   LAPAROSCOPIC VAGINAL HYSTERECTOMY WITH SALPINGECTOMY Bilateral 06/07/2021   Procedure: LAPAROSCOPIC ASSISTED VAGINAL HYSTERECTOMY WITH BILATERAL SALPINGECTOMY;  Surgeon: Mitchel Honour, DO;  Location: [facility];  Service: Gynecology;  Laterality: Bilateral;   POLYPECTOMY N/A 06/18/2022   Procedure: POLYPECTOMY;  Surgeon: Midge Minium, MD;  [facility];  Service: Endoscopy;  Laterality: N/A;   TUBAL LIGATION     WISDOM TOOTH EXTRACTION  2013    Allergies  Allergen Reactions   Azithromycin Hives    Current Outpatient Medications  Medication Sig Dispense Refill   atorvastatin (LIPITOR) 10 MG tablet Take 1 tablet (10 mg total) by mouth daily. 90 tablet 0   buPROPion (WELLBUTRIN SR) 150 MG 12 hr tablet Take 1 tablet (150 mg total) by mouth daily. Please schedule an annual wellness in May 2024. 90 tablet 0   busPIRone (BUSPAR) 10 MG tablet TAKE 1 TABLET BY MOUTH 3 TIMES DAILY 270 tablet 1   FLUoxetine (PROZAC) 40 MG capsule TAKE 1 CAPSULE DAILY 90 capsule 1   hydrOXYzine (ATARAX) 10 MG tablet TAKE 1 TABLET BY MOUTH ONCE A  DAY AS NEEDED 90 tablet 0   levothyroxine (SYNTHROID) 88 MCG tablet Take 1 tablet (88 mcg total) by mouth daily. 90 tablet 0   lisinopril-hydrochlorothiazide (ZESTORETIC) 20-12.5 MG tablet TAKE ONE TABLET BY MOUTH ONCE A DAY 90 tablet 0   meloxicam (MOBIC) 15 MG tablet Take 1 tablet (15 mg total) by mouth daily. 30 tablet 1   omeprazole (PRILOSEC) 20  MG capsule Take 1 capsule (20 mg total) by mouth daily. 90 capsule 0   Semaglutide-Weight Management 0.5 MG/0.5ML SOAJ Inject 0.5 mg into the skin once a week. 6 mL 0   No current facility-administered medications for this visit.    Family History Family History  Problem Relation Age of Onset   Mental illness Mother    Arthritis Father    Hyperlipidemia Father    Hypertension Father    Diabetes Father    Arthritis Maternal Grandmother    Diabetes Maternal Grandmother    Arthritis Maternal Grandfather    Diabetes Maternal Grandfather    Arthritis Paternal Grandmother    Breast cancer Paternal Grandmother       Social History Social History   Tobacco Use   Smoking status: Never   Smokeless tobacco: Never  Vaping Use   Vaping Use: Never used  Substance Use Topics   Alcohol use: Yes    Comment: occasional   Drug use: No    Physical Exam Blood pressure (!) 146/91, pulse 88, temperature 98.2 F (36.8 C), temperature source Oral, height 5\' 4"  (1.626 m), weight 201 lb (91.2 kg), last menstrual period 05/15/2021, SpO2 97 %. Last Weight  Most recent update: 04/25/2023  1:12 PM    Weight  91.2 kg (201 lb)             CONSTITUTIONAL: Well developed, and nourished, appropriately responsive and aware without distress.   EYES: Sclera non-icteric.   RESPIRATORY:  Normal respiratory effort without pathologic use of accessory muscles. CV: well perfused Abdomen: benign.   NEUROLOGIC:  Motor and sensation appear grossly normal.  Cranial nerves are grossly without defect. PSYCH:  Alert and oriented to person, place and time. Affect is appropriate for situation.  Data Reviewed I have personally reviewed what is currently available of the patient's imaging, recent labs and medical records.   Labs:     Latest Ref Rng & Units 10/10/2022    8:32 AM 03/15/2022    3:58 AM 03/14/2022   12:02 AM  CBC  WBC 3.8 - 10.8 Thousand/uL 6.8  9.7  11.0   Hemoglobin 11.7 - 15.5 g/dL 40.9  81.1   91.4   Hematocrit 35.0 - 45.0 % 40.6  37.4  42.0   Platelets 140 - 400 Thousand/uL 159  151  184       Latest Ref Rng & Units 10/10/2022    8:32 AM 10/10/2022    8:26 AM 03/15/2022    8:51 AM  CMP  Glucose 65 - 99 mg/dL 83  85    BUN 7 - 25 mg/dL 17  17    Creatinine 7.82 - 1.03 mg/dL 9.56  2.13    Sodium 086 - 146 mmol/L 140  140    Potassium 3.5 - 5.3 mmol/L 3.8  3.8    Chloride 98 - 110 mmol/L 104  104    CO2 20 - 32 mmol/L 27  27    Calcium 8.6 - 10.4 mg/dL 9.1  9.0    Total Protein 6.1 - 8.1 g/dL 6.6  6.6  6.3  Total Bilirubin 0.2 - 1.2 mg/dL 0.5  0.5  0.7   Alkaline Phos 38 - 126 U/L   56   AST 10 - 35 U/L 14  13  15    ALT 6 - 29 U/L 14  14  14     Imaging: Radiological images reviewed:   CLINICAL DATA:  Follow-up of pancreatic neck cystic lesion. Asymptomatic.   EXAM: MRI ABDOMEN WITHOUT AND WITH CONTRAST   TECHNIQUE: Multiplanar multisequence MR imaging of the abdomen was performed both before and after the administration of intravenous contrast.   CONTRAST:  9mL GADAVIST GADOBUTROL 1 MMOL/ML IV SOLN   COMPARISON:  10/14/2022, 03/14/2022   FINDINGS: Lower chest: No acute abnormality.   Hepatobiliary: No focal liver abnormality is seen. Status post cholecystectomy. No biliary dilatation.   Pancreas: Unchanged, lobulated cystic lesion of the superior pancreatic neck measuring 2.0 x 1.3 cm, as previously reported possibly with a thin internal septation (series 4, image 14). No solid component or suspicious contrast enhancement. No pancreatic ductal dilatation or surrounding inflammatory changes.   Spleen: Normal in size without significant abnormality.   Adrenals/Urinary Tract: Adrenal glands are unremarkable. Kidneys are normal, without renal calculi, solid lesion, or hydronephrosis.   Stomach/Bowel: Stomach is within normal limits. No evidence of bowel wall thickening, distention, or inflammatory changes.   Vascular/Lymphatic: No significant vascular  findings are present. No enlarged abdominal lymph nodes.   Other: No abdominal wall hernia or abnormality. No ascites.   Musculoskeletal: No acute or significant osseous findings.   IMPRESSION: 1. Unchanged, lobulated cystic lesion of the superior pancreatic neck measuring 2.0 x 1.3 cm. No solid component or suspicious contrast enhancement. No pancreatic ductal dilatation or surrounding inflammatory changes. This is most likely a side branch IPMN or pseudocyst. Given size and patient age, recommend additional follow-up at 6 months to establish 2 years of initial stability. 2. Status post cholecystectomy.     Electronically Signed   By: Jearld Lesch M.D.   On: 04/15/2023 13:15 Within last 24 hrs: No results found.  Assessment    Pancreatic cystic lesions stable to size/character over 33-month interval. Patient Active Problem List   Diagnosis Date Noted   Cystic mass of pancreas 10/25/2022   Iron deficiency anemia due to chronic blood loss 09/27/2021   Prediabetes 09/27/2021   Class 1 obesity due to excess calories with body mass index (BMI) of 34.0 to 34.9 in adult 09/27/2021   Severe sleep apnea 01/18/2021   HLD (hyperlipidemia) 08/26/2017   Migraines 08/26/2017   Essential hypertension 01/28/2017   Insomnia 04/02/2014   Anxiety and depression 02/19/2014   Hypothyroidism 02/19/2014   GERD (gastroesophageal reflux disease) 02/19/2014    Plan    We discussed the options of continued radiologic follow-up, versus referral to pancreatic surgeon.  She appreciate that while I do not do pancreatic surgery, am following her.  We feel it is prudent to continue to monitor these lesions at this time and not become overly aggressive and set further morbidity of an unnecessary procedure.  We would also be glad to refer her to a tertiary care center where an oncologic surgeon may be of greater experience following these lesions.  But I believe it is efficient at this time to continue our  45-month follow-up with MRI imaging, and complete the recommended 2 years.  We will anticipate seeing her back in 34-months with a pre and postcontrast MRCP in follow-up.  Face-to-face time spent with the patient and accompanying care providers(if present) was  15 minutes, with more than 50% of the time spent counseling, educating, and coordinating care of the patient.    These notes generated with voice recognition software. I apologize for typographical errors.  Campbell Lerner M.D., FACS 04/25/2023, 1:25 PM

## 2023-05-01 ENCOUNTER — Ambulatory Visit: Payer: Federal, State, Local not specified - PPO | Admitting: Podiatry

## 2023-05-01 DIAGNOSIS — M2141 Flat foot [pes planus] (acquired), right foot: Secondary | ICD-10-CM | POA: Diagnosis not present

## 2023-05-01 DIAGNOSIS — M76821 Posterior tibial tendinitis, right leg: Secondary | ICD-10-CM

## 2023-05-01 DIAGNOSIS — M722 Plantar fascial fibromatosis: Secondary | ICD-10-CM | POA: Diagnosis not present

## 2023-05-01 MED ORDER — BETAMETHASONE SOD PHOS & ACET 6 (3-3) MG/ML IJ SUSP
3.0000 mg | Freq: Once | INTRAMUSCULAR | Status: AC
Start: 2023-05-01 — End: 2023-05-01
  Administered 2023-05-01: 3 mg via INTRA_ARTICULAR

## 2023-05-01 NOTE — Progress Notes (Signed)
Chief Complaint  Patient presents with   Plantar Fasciitis    Patient came in today for plantar fasciitis pain follow-up, patient is still having pain, rate of pain 8 out 10, dull throbbing pain, patient has questions about PRP, EPAT,     Subjective: 52 y.o. female presenting today for follow-up evaluation of right lower extremity heel pain.  Patient states that the pain extends up the back of her leg.  She experiences tightness on a daily basis.  She is very frustrated because she has been dealing with plantar fasciitis and heel pain intermittently for over 5 years now.  She has tried multiple conservative modalities including physical therapy, custom molded orthotics, cortisone injections, oral anti-inflammatories, shoe gear modifications and lifestyle changes with no permanent improvement.  She would like to discuss other treatment options and modalities today.  Presenting today for further treatment evaluation   Past Medical History:  Diagnosis Date   Anxiety 06/01/2021   COVID 12/2020   headache, fever, stuffy nose, loss of taste and smell x few weeks all sx resolved except taste is altered   Depression 06/01/2021   Gastric ulcer    yrs ago no current problems per pt on 06-01-2021   GERD (gastroesophageal reflux disease) 06/01/2021   Hyperlipidemia 06/01/2021   Hypertension 06/01/2021   Hypothyroidism 06/01/2021   Menorrhagia 06/01/2021   Migraines 06/01/2021   regulae headaches also @ times   Nocturia 06/01/2021   Plantar fasciitis 06/01/2021   both feet right worse than left   Prediabetes    Sleep apnea 06/01/2021   cpap severe osa does not know cpap settings   SUI (stress urinary incontinence, female) 06/01/2021   Wears glasses 06/01/2021   Past Surgical History:  Procedure Laterality Date   ABDOMINAL HYSTERECTOMY     BLADDER SUSPENSION N/A 06/07/2021   Procedure: TRANSVAGINAL TAPE (TVT) PROCEDURE;  Surgeon: Mitchel Honour, DO;  Location: [facility];   Service: Gynecology;  Laterality: N/A;   CATARACT EXTRACTION, BILATERAL  07/11/2017   COLONOSCOPY WITH PROPOFOL N/A 06/18/2022   Procedure: COLONOSCOPY WITH BIOPSY;  Surgeon: Midge Minium, MD;  [facility];  Service: Endoscopy;  Laterality: N/A;   DILITATION & CURRETTAGE/HYSTROSCOPY WITH NOVASURE ABLATION N/A 01/10/2017   Procedure: DILATATION & CURETTAGE/HYSTEROSCOPY WITH NOVASURE ABLATION;  Surgeon: Mitchel Honour, DO;  Location: [facility];  Service: Gynecology;  Laterality: N/A;   ERCP N/A 03/15/2022   Procedure: ENDOSCOPIC RETROGRADE CHOLANGIOPANCREATOGRAPHY (ERCP);  Surgeon: Midge Minium, MD;  Location: [facility];  Service: Endoscopy;  Laterality: N/A;   EYE SURGERY     LAPAROSCOPIC TUBAL LIGATION Bilateral 01/10/2017   Procedure: LAPAROSCOPIC TUBAL LIGATION with Filshie Clips;  Surgeon: Mitchel Honour, DO;  Location[facility];  Service: Gynecology;  Laterality: Bilateral;   LAPAROSCOPIC VAGINAL HYSTERECTOMY WITH SALPINGECTOMY Bilateral 06/07/2021   Procedure: LAPAROSCOPIC ASSISTED VAGINAL HYSTERECTOMY WITH BILATERAL SALPINGECTOMY;  Surgeon: Mitchel Honour, DO;  Location: [facility];  Service: Gynecology;  Laterality: Bilateral;   POLYPECTOMY N/A 06/18/2022   Procedure: POLYPECTOMY;  Surgeon: Midge Minium, MD;  Location: [facility];  Service: Endoscopy;  Laterality: N/A;   TUBAL LIGATION     WISDOM TOOTH EXTRACTION  2013   Allergies  Allergen Reactions   Azithromycin Hives      RT lower extremity 05/01/2023  Objective: Physical Exam General: The patient is alert and oriented x3 in no acute distress.  Dermatology: Skin is warm, dry and supple bilateral lower extremities. Negative for open lesions or  macerations bilateral.   Vascular: Dorsalis Pedis and Posterior Tibial pulses palpable bilateral.  Capillary fill time is immediate to all digits.  Neurological: Epicritic and protective threshold intact  bilateral.   Musculoskeletal: Tenderness to palpation to the plantar aspect of the right plantar fascia more distally along the mid substance and arch of the foot.  There is also some tightness with ankle joint dorsiflexion with the foot held in rectus alignment.  Consistent with gastrocnemius equinus.  There is some tenderness along the posterior leg as well.  Today the pain seems to be also located around the posterior tibial tendon as it inserts on the navicular tuberosity and enters the plantar arch of the foot.  With weightbearing there is collapse of the medial longitudinal arch of the foot and rear foot valgus with weightbearing.  See above noted photos all other joints range of motion within normal limits bilateral. Strength 5/5 in all groups bilateral.   Radiographic exam RT foot 02/13/2023: Normal osseous mineralization.  Joint spaces preserved.  There is some medial deviation of the talar head and collapse of the medial longitudinal arch of the foot consistent with pes planovalgus deformity.  This correlates clinically.  Plantar heel spur also noted.  Impression: Flatfoot deformity.  Plantar heel spur.  Assessment: 1. Plantar fasciitis right-mid substance 2.  Pes planus right lower extremity 3.  Insertional posterior tibial tendinitis right  -Patient evaluated.  X-rays taken 02/13/2023 were reviewed again today -Injection of 0.5cc Celestone soluspan injected into the right plantar fascia mid substance -Today we discussed additional treatment options and modalities including PRP, ESWT, stem cell injections, and ultimately surgery.  Risk benefits advantages and disadvantages of each modality were explained in detail to the patient.  I do believe the patient is also experiencing pain and symptoms associated to her flatfoot deformity in addition to the plantar fasciitis.  Unfortunately she has tried custom orthotics and shoe gear modifications with no relief.  If we were to pursue the surgical  treatment option I would like to correct for the flatfoot deformity as well.  This is not a severe flatfoot deformity I do believe the patient would do well with a simple isolated Menelik Mcfarren calcaneal osteotomy which has demonstrated correction in all 3 planes to correct for the flatfoot deformity. -There is also pain and tenderness along the posterior tibial tendon as it inserts onto the navicular tuberosity and plantar arch of the foot.  I would like to get an MRI prior to surgery.  Medically necessary for surgical planning and to evaluate the health of the tendon. -MRI ordered wo contrast RT foot -Return to clinic after MRI results to review the results and discuss different treatment options and likely surgical consult  *Administrative for [facility] service   Felecia Shelling, DPM [facility]  Dr. Felecia Shelling, DPM    [address] [place], [place] [address]                Office [phone]  Fax [phone]

## 2023-05-03 ENCOUNTER — Telehealth: Payer: Self-pay

## 2023-05-03 ENCOUNTER — Encounter: Payer: Self-pay | Admitting: Internal Medicine

## 2023-05-03 NOTE — Telephone Encounter (Signed)
Copied from CRM [id number]. Topic: General - Other >> May 03, 2023  1:22 PM Ja-Kwan M wrote: Reason for CRM: Pt requests a lab only appt. No lab order from the office is listed. Cb# [phone]

## 2023-05-03 NOTE — Telephone Encounter (Signed)
She is due to have her TSH rechecked.  Did you need to add anything else?  Thanks,   -Vernona Rieger

## 2023-05-07 ENCOUNTER — Encounter: Payer: Self-pay | Admitting: Internal Medicine

## 2023-05-07 MED ORDER — SEMAGLUTIDE-WEIGHT MANAGEMENT 1 MG/0.5ML ~~LOC~~ SOAJ: 1.0000 mg | SUBCUTANEOUS | 0 refills | Status: DC

## 2023-05-07 NOTE — Telephone Encounter (Signed)
I did not order lab only appointment because she was due to have her physical in April 2024.  She just needs to schedule a physical appointment with me and we will check her TSH at that time.

## 2023-05-08 NOTE — Telephone Encounter (Signed)
This has been addressed.  See My Chart message.   Thank you,   -Vernona Rieger

## 2023-05-09 ENCOUNTER — Ambulatory Visit
Admission: RE | Admit: 2023-05-09 | Discharge: 2023-05-09 | Disposition: A | Discharge status: Home / Self Care | Payer: Federal, State, Local not specified - PPO | Source: Ambulatory Visit | Attending: Podiatry | Admitting: Podiatry

## 2023-05-09 DIAGNOSIS — M2141 Flat foot [pes planus] (acquired), right foot: Secondary | ICD-10-CM

## 2023-05-09 DIAGNOSIS — G8929 Other chronic pain: Secondary | ICD-10-CM | POA: Diagnosis not present

## 2023-05-09 DIAGNOSIS — M722 Plantar fascial fibromatosis: Secondary | ICD-10-CM

## 2023-05-09 DIAGNOSIS — M79671 Pain in right foot: Secondary | ICD-10-CM | POA: Diagnosis not present

## 2023-05-09 DIAGNOSIS — M76821 Posterior tibial tendinitis, right leg: Secondary | ICD-10-CM

## 2023-05-13 ENCOUNTER — Ambulatory Visit: Payer: Federal, State, Local not specified - PPO | Admitting: Podiatry

## 2023-05-14 ENCOUNTER — Encounter: Payer: Self-pay | Admitting: Podiatry

## 2023-05-15 ENCOUNTER — Ambulatory Visit: Payer: Federal, State, Local not specified - PPO | Admitting: Podiatry

## 2023-05-17 ENCOUNTER — Encounter: Payer: Self-pay | Admitting: Podiatry

## 2023-05-17 ENCOUNTER — Ambulatory Visit (INDEPENDENT_AMBULATORY_CARE_PROVIDER_SITE_OTHER): Payer: Federal, State, Local not specified - PPO | Admitting: Internal Medicine

## 2023-05-17 ENCOUNTER — Encounter: Payer: Self-pay | Admitting: Internal Medicine

## 2023-05-17 VITALS — BP 108/76 | HR 77 | Temp 97.3°F | Ht 64.0 in | Wt 198.0 lb

## 2023-05-17 DIAGNOSIS — Z0001 Encounter for general adult medical examination with abnormal findings: Secondary | ICD-10-CM | POA: Diagnosis not present

## 2023-05-17 DIAGNOSIS — Z6833 Body mass index (BMI) 33.0-33.9, adult: Secondary | ICD-10-CM

## 2023-05-17 DIAGNOSIS — E039 Hypothyroidism, unspecified: Secondary | ICD-10-CM | POA: Diagnosis not present

## 2023-05-17 DIAGNOSIS — E6609 Other obesity due to excess calories: Secondary | ICD-10-CM

## 2023-05-17 DIAGNOSIS — E66811 Obesity, class 1: Secondary | ICD-10-CM

## 2023-05-17 DIAGNOSIS — E7800 Pure hypercholesterolemia, unspecified: Secondary | ICD-10-CM

## 2023-05-17 DIAGNOSIS — R7303 Prediabetes: Secondary | ICD-10-CM

## 2023-05-17 LAB — CBC
MCH: 31.5 pg (ref 27.0–33.0)
RDW: 12.6 % (ref 11.0–15.0)
WBC: 7.2 10*3/uL (ref 3.8–10.8)

## 2023-05-17 MED ORDER — HYDROXYZINE HCL 10 MG PO TABS: 10.0000 mg | ORAL_TABLET | Freq: Every day | ORAL | 0 refills | Status: AC | PRN

## 2023-05-17 NOTE — Patient Instructions (Signed)

## 2023-05-17 NOTE — Assessment & Plan Note (Signed)
Encourage diet and exercise for weight loss 

## 2023-05-17 NOTE — Progress Notes (Signed)
Subjective:    Patient ID: Anne Mcguire, female    DOB: 13-Jul-1971, 52 y.o.   MRN: [id number]  HPI  Patient presents to clinic today for her annual exam.  Flu: 08/2022 Tetanus: 03/2022 COVID: Pfizer x 2 Shingrix: 04/2022, 10/2022 Pap smear: Hysterectomy Mammogram: 05/2022 Colon screening: 06/2022 Vision screening: annually Dentist: biannually  Diet: She does eat meat. She consumes fruits and veggies. She does eat some fried foods. She drinks mostly coffee, tea Exercise: None  Review of Systems     Past Medical History:  Diagnosis Date   Anxiety 06/01/2021   COVID 12/2020   headache, fever, stuffy nose, loss of taste and smell x few weeks all sx resolved except taste is altered   Depression 06/01/2021   Gastric ulcer    yrs ago no current problems per pt on 06-01-2021   GERD (gastroesophageal reflux disease) 06/01/2021   Hyperlipidemia 06/01/2021   Hypertension 06/01/2021   Hypothyroidism 06/01/2021   Menorrhagia 06/01/2021   Migraines 06/01/2021   regulae headaches also @ times   Nocturia 06/01/2021   Plantar fasciitis 06/01/2021   both feet right worse than left   Prediabetes    Sleep apnea 06/01/2021   cpap severe osa does not know cpap settings   SUI (stress urinary incontinence, female) 06/01/2021   Wears glasses 06/01/2021    Current Outpatient Medications  Medication Sig Dispense Refill   atorvastatin (LIPITOR) 10 MG tablet Take 1 tablet (10 mg total) by mouth daily. 90 tablet 0   buPROPion (WELLBUTRIN SR) 150 MG 12 hr tablet Take 1 tablet (150 mg total) by mouth daily. Please schedule an annual wellness in May 2024. 90 tablet 0   busPIRone (BUSPAR) 10 MG tablet TAKE 1 TABLET BY MOUTH 3 TIMES DAILY 270 tablet 1   FLUoxetine (PROZAC) 40 MG capsule TAKE 1 CAPSULE DAILY 90 capsule 1   hydrOXYzine (ATARAX) 10 MG tablet TAKE 1 TABLET BY MOUTH ONCE A DAY AS NEEDED 90 tablet 0   levothyroxine (SYNTHROID) 88 MCG tablet Take 1 tablet (88 mcg total) by mouth  daily. 90 tablet 0   lisinopril-hydrochlorothiazide (ZESTORETIC) 20-12.5 MG tablet TAKE ONE TABLET BY MOUTH ONCE A DAY 90 tablet 0   meloxicam (MOBIC) 15 MG tablet Take 1 tablet (15 mg total) by mouth daily. 30 tablet 1   omeprazole (PRILOSEC) 20 MG capsule Take 1 capsule (20 mg total) by mouth daily. 90 capsule 0   Semaglutide-Weight Management 1 MG/0.5ML SOAJ Inject 1 mg into the skin once a week. 2 mL 0   No current facility-administered medications for this visit.    Allergies  Allergen Reactions   Azithromycin Hives    Family History  Problem Relation Age of Onset   Mental illness Mother    Arthritis Father    Hyperlipidemia Father    Hypertension Father    Diabetes Father    Arthritis Maternal Grandmother    Diabetes Maternal Grandmother    Arthritis Maternal Grandfather    Diabetes Maternal Grandfather    Arthritis Paternal Grandmother    Breast cancer Paternal Grandmother     Social History   Socioeconomic History   Marital status: Married    Spouse name: Not on file   Number of children: Not on file   Years of education: Not on file   Highest education level: Not on file  Occupational History   Not on file  Tobacco Use   Smoking status: Never   Smokeless tobacco: Never  Vaping Use  Vaping Use: Never used  Substance and Sexual Activity   Alcohol use: Yes    Comment: occasional   Drug use: No   Sexual activity: Yes  Other Topics Concern   Not on file  Social History Narrative   Not on file   Social Determinants of Health   Financial Resource Strain: Not on file  Food Insecurity: Not on file  Transportation Needs: Not on file  Physical Activity: Not on file  Stress: Not on file  Social Connections: Not on file  Intimate Partner Violence: Not on file     Constitutional: Patient reports intermittent headaches.  Denies fever, malaise, fatigue, or abrupt weight changes.  HEENT: Denies eye pain, eye redness, ear pain, ringing in the ears, wax  buildup, runny nose, nasal congestion, bloody nose, or sore throat. Respiratory: Denies difficulty breathing, shortness of breath, cough or sputum production.   Cardiovascular: Denies chest pain, chest tightness, palpitations or swelling in the hands or feet.  Gastrointestinal: Denies abdominal pain, bloating, constipation, diarrhea or blood in the stool.  GU: Denies urgency, frequency, pain with urination, burning sensation, blood in urine, odor or discharge. Musculoskeletal: Pt reports right foot pain. Denies decrease in range of motion, difficulty with gait, muscle pain or joint pain and swelling.  Skin: Denies redness, rashes, lesions or ulcercations.  Neurological: Patient reports insomnia.  Denies dizziness, difficulty with memory, difficulty with speech or problems with balance and coordination.  Psych: Patient has a history of anxiety and depression.  Denies SI/HI.  No other specific complaints in a complete review of systems (except as listed in HPI above).  Objective:   Physical Exam   BP 108/76 (BP Location: Left Arm, Patient Position: Sitting, Cuff Size: Normal)   Pulse 77   Temp (!) 97.3 F (36.3 C) (Temporal)   Ht 5\' 4"  (1.626 m)   Wt 198 lb (89.8 kg)   LMP 05/15/2021   SpO2 97%   BMI 33.99 kg/m   Wt Readings from Last 3 Encounters:  04/25/23 201 lb (91.2 kg)  04/12/23 202 lb (91.6 kg)  10/10/22 202 lb (91.6 kg)    General: Appears her stated age, obese, in NAD. Skin: Warm, dry and intact. No rashes, lesions or ulcerations noted. HEENT: Head: normal shape and size; Eyes: sclera white, no icterus, conjunctiva pink, PERRLA and EOMs intact;  Neck:  Neck supple, trachea midline. No masses, lumps or thyromegaly present.  Cardiovascular: Normal rate and rhythm. S1,S2 noted.  No murmur, rubs or gallops noted. No JVD or BLE edema. No carotid bruits noted. Pulmonary/Chest: Normal effort and positive vesicular breath sounds. No respiratory distress. No wheezes, rales or  ronchi noted.  Abdomen: Normal bowel sounds.  Musculoskeletal: Strength 5/5 BUE/BLE. No difficulty with gait.  Neurological: Alert and oriented. Cranial nerves II-XII grossly intact. Coordination normal.  Psychiatric: Mood and affect normal. Behavior is normal. Judgment and thought content normal.   BMET    Component Value Date/Time   NA 140 10/10/2022 0832   K 3.8 10/10/2022 0832   CL 104 10/10/2022 0832   CO2 27 10/10/2022 0832   GLUCOSE 83 10/10/2022 0832   BUN 17 10/10/2022 0832   CREATININE 0.78 10/10/2022 0832   CALCIUM 9.1 10/10/2022 0832   GFRNONAA >60 03/14/2022 1320    Lipid Panel     Component Value Date/Time   CHOL 131 10/10/2022 0832   TRIG 142 10/10/2022 0832   HDL 49 (L) 10/10/2022 0832   CHOLHDL 2.7 10/10/2022 0832   VLDL 21.4  03/25/2020 0749   LDLCALC 59 10/10/2022 0832    CBC    Component Value Date/Time   WBC 6.8 10/10/2022 0832   RBC 4.33 10/10/2022 0832   HGB 13.2 10/10/2022 0832   HCT 40.6 10/10/2022 0832   PLT 159 10/10/2022 0832   MCV 93.8 10/10/2022 0832   MCH 30.5 10/10/2022 0832   MCHC 32.5 10/10/2022 0832   RDW 13.0 10/10/2022 0832   LYMPHSABS 1.5 03/14/2022 0002   MONOABS 0.7 03/14/2022 0002   EOSABS 0.1 03/14/2022 0002   BASOSABS 0.0 03/14/2022 0002    Hgb A1C Lab Results  Component Value Date   HGBA1C 5.5 10/10/2022           Assessment & Plan:   Preventative Health Maintenance:  Encouraged her to get a flu shot the fall Tetanus UTD Encouraged her to get her COVID booster Shingrix UTD She no longer needs to screen for cervical cancer Mammogram ordered previously she just needs to call and schedule Colon screening UTD Encouraged her to consume a balanced diet and exercise regimen Advised her to see an eye doctor and dentist annually We will check CBC, c-Met, TSH, free T4, lipid, A1c today  RTC in 6 months, follow-up chronic conditions [name], NP

## 2023-05-18 LAB — COMPLETE METABOLIC PANEL WITH GFR
AG Ratio: 1.7 (calc) (ref 1.0–2.5)
ALT: 15 U/L (ref 6–29)
AST: 15 U/L (ref 10–35)
Albumin: 4.5 g/dL (ref 3.6–5.1)
Alkaline phosphatase (APISO): 56 U/L (ref 37–153)
BUN: 12 mg/dL (ref 7–25)
CO2: 27 mmol/L (ref 20–32)
Calcium: 9.5 mg/dL (ref 8.6–10.4)
Chloride: 103 mmol/L (ref 98–110)
Creat: 0.82 mg/dL (ref 0.50–1.03)
Globulin: 2.6 g/dL (calc) (ref 1.9–3.7)
Glucose, Bld: 80 mg/dL (ref 65–139)
Potassium: 4 mmol/L (ref 3.5–5.3)
Sodium: 139 mmol/L (ref 135–146)
Total Bilirubin: 0.7 mg/dL (ref 0.2–1.2)
Total Protein: 7.1 g/dL (ref 6.1–8.1)
eGFR: 87 mL/min/{1.73_m2} (ref >=60)

## 2023-05-18 LAB — TSH: TSH: 1.88 mIU/L

## 2023-05-18 LAB — CBC
HCT: 42.6 % (ref 35.0–45.0)
Hemoglobin: 14.4 g/dL (ref 11.7–15.5)
MCHC: 33.8 g/dL (ref 32.0–36.0)
MCV: 93.2 fL (ref 80.0–100.0)
MPV: 12.1 fL (ref 7.5–12.5)
Platelets: 174 10*3/uL (ref 140–400)
RBC: 4.57 10*6/uL (ref 3.80–5.10)

## 2023-05-18 LAB — LIPID PANEL
Cholesterol: 172 mg/dL (ref <200)
HDL: 56 mg/dL (ref >=50)
LDL Cholesterol (Calc): 95 mg/dL (calc)
Non-HDL Cholesterol (Calc): 116 mg/dL (calc) (ref <130)
Total CHOL/HDL Ratio: 3.1 (calc) (ref <5.0)
Triglycerides: 115 mg/dL (ref <150)

## 2023-05-18 LAB — HEMOGLOBIN A1C
Hgb A1c MFr Bld: 5.6 % of total Hgb (ref <5.7)
Mean Plasma Glucose: 114 mg/dL
eAG (mmol/L): 6.3 mmol/L

## 2023-05-18 LAB — T4, FREE: Free T4: 1.2 ng/dL (ref 0.8–1.8)

## 2023-05-20 ENCOUNTER — Ambulatory Visit: Payer: Federal, State, Local not specified - PPO | Admitting: Podiatry

## 2023-05-22 ENCOUNTER — Ambulatory Visit: Payer: Federal, State, Local not specified - PPO | Admitting: Podiatry

## 2023-05-22 DIAGNOSIS — M722 Plantar fascial fibromatosis: Secondary | ICD-10-CM | POA: Diagnosis not present

## 2023-05-22 DIAGNOSIS — M62461 Contracture of muscle, right lower leg: Secondary | ICD-10-CM

## 2023-05-22 NOTE — Progress Notes (Signed)
Chief Complaint  Patient presents with   Plantar Fasciitis    Patient came in today for Plantar fasciitis right follow-up, MRI results      Subjective: 52 y.o. female presenting today for follow-up evaluation of right lower extremity heel pain. She presents for further treatment and evaluation.  She has tried multiple conservative modalities including physical therapy, custom molded orthotics, cortisone injections, oral anti-inflammatories, shoe gear modifications, with no significant improvement or lasting alleviation of her symptoms.  She has been seen here in the office for the past 5 years intermittently for her symptoms.  Last visit MRI was ordered.  She presents today to review the MRI results and discuss further treatment options and possible surgical consult   Past Medical History:  Diagnosis Date   Anxiety 06/01/2021   COVID 12/2020   headache, fever, stuffy nose, loss of taste and smell x few weeks all sx resolved except taste is altered   Depression 06/01/2021   Gastric ulcer    yrs ago no current problems per pt on 06-01-2021   GERD (gastroesophageal reflux disease) 06/01/2021   Hyperlipidemia 06/01/2021   Hypertension 06/01/2021   Hypothyroidism 06/01/2021   Menorrhagia 06/01/2021   Migraines 06/01/2021   regulae headaches also @ times   Nocturia 06/01/2021   Plantar fasciitis 06/01/2021   both feet right worse than left   Prediabetes    Sleep apnea 06/01/2021   cpap severe osa does not know cpap settings   SUI (stress urinary incontinence, female) 06/01/2021   Wears glasses 06/01/2021   Past Surgical History:  Procedure Laterality Date   ABDOMINAL HYSTERECTOMY     BLADDER SUSPENSION N/A 06/07/2021   Procedure: TRANSVAGINAL TAPE (TVT) PROCEDURE;  Surgeon: Mitchel Honour, DO;  Location: [facility];  Service: Gynecology;  Laterality: N/A;   CATARACT EXTRACTION, BILATERAL  07/11/2017   COLONOSCOPY WITH PROPOFOL N/A 06/18/2022   Procedure:  COLONOSCOPY WITH BIOPSY;  Surgeon: Midge Minium, MD;  Location: [facility];  Service: Endoscopy;  Laterality: N/A;   DILITATION & CURRETTAGE/HYSTROSCOPY WITH NOVASURE ABLATION N/A 01/10/2017   Procedure: DILATATION & CURETTAGE/HYSTEROSCOPY WITH NOVASURE ABLATION;  Surgeon: Mitchel Honour, DO;  Location: [facility];  Service: Gynecology;  Laterality: N/A;   ERCP N/A 03/15/2022   Procedure: ENDOSCOPIC RETROGRADE CHOLANGIOPANCREATOGRAPHY (ERCP);  Surgeon: Midge Minium, MD;  Location: [facility];  Service: Endoscopy;  Laterality: N/A;   EYE SURGERY     LAPAROSCOPIC TUBAL LIGATION Bilateral 01/10/2017   Procedure: LAPAROSCOPIC TUBAL LIGATION with Filshie Clips;  Surgeon: Mitchel Honour, DO;  Location: [facility] [place];  Service: Gynecology;  Laterality: Bilateral;   LAPAROSCOPIC VAGINAL HYSTERECTOMY WITH SALPINGECTOMY Bilateral 06/07/2021   Procedure: LAPAROSCOPIC ASSISTED VAGINAL HYSTERECTOMY WITH BILATERAL SALPINGECTOMY;  Surgeon: Mitchel Honour, DO;  Location: [facility];  Service: Gynecology;  Laterality: Bilateral;   POLYPECTOMY N/A 06/18/2022   Procedure: POLYPECTOMY;  Surgeon: Midge Minium, MD;  [facility];  Service: Endoscopy;  Laterality: N/A;   TUBAL LIGATION     WISDOM TOOTH EXTRACTION  2013   Allergies  Allergen Reactions   Azithromycin Hives      RT lower extremity 05/01/2023  Objective: Physical Exam General: The patient is alert and oriented x3 in no acute distress.  Dermatology: Skin is warm, dry and supple bilateral lower extremities. Negative for open lesions or macerations bilateral.   Vascular: Dorsalis Pedis and Posterior Tibial pulses palpable bilateral.  Capillary fill time is immediate to all digits.  Neurological: Epicritic and protective  threshold intact bilateral.   Musculoskeletal: Unchanged.  Tenderness to palpation to the plantar aspect of the right plantar fascia more distally along the mid  substance and arch of the foot.  There is also some tightness with ankle joint dorsiflexion with the foot held in rectus alignment.  Consistent with gastrocnemius equinus.  There is some tenderness along the posterior leg as well.  Today the pain seems to be also located around the posterior tibial tendon as it inserts on the navicular tuberosity and enters the plantar arch of the foot.  With weightbearing there is collapse of the medial longitudinal arch of the foot and rear foot valgus with weightbearing.  See above noted photos all other joints range of motion within normal limits bilateral. Strength 5/5 in all groups bilateral.   Radiographic exam RT foot 02/13/2023: Normal osseous mineralization.  Joint spaces preserved.  There is some medial deviation of the talar head and collapse of the medial longitudinal arch of the foot consistent with pes planovalgus deformity.  This correlates clinically.  Plantar heel spur also noted.  Impression: Flatfoot deformity.  Plantar heel spur.  Assessment: 1. Plantar fasciitis right-mid substance 2.  Pes planus right lower extremity 3.  Insertional posterior tibial tendinitis right secondary to mild flatfoot deformity 4.  Gastrocnemius equinus right lower extremity  -Patient evaluated.  MRI reviewed -Today again we had a long discussion with additional modalities and ultimately surgery to correct for the patient's symptoms and pain.  The patient would like to pursue conservative treatment at the moment which I believe is appropriate since he has failed multiple conservative modalities for a prolonged period of time.  We had discussed Shalandria Elsbernd calcaneal osteotomy to help recreate an arch in her foot and prevent arch collapse however the patient would like to pursue more conservative soft tissue only measures.  I explained that I do believe with the gastric aponeurosis lengthening and endoscopic plantar fasciotomy she should have some relief of her symptoms although this  does not correct for the flatfoot deformity and she may have some residual associated pain to this.  She understands and for now would like to pursue more conservative surgical approach and not address the actual flatfoot deformity.  Discussed in detail gastroc aponeurosis lengthening with endoscopic plantar fasciotomy.  Risk benefits advantages and disadvantages as well as relative efficacies were explained in detail to the patient.  No guarantees were expressed or implied.  The patient would like to pursue this option. -Authorization for surgery was initiated today.  Surgery will consist of gastric aponeurosis lengthening right.  Endoscopic plantar fasciotomy right. -Return to clinic 1 week postop  *Administrative for [facility] service   Felecia Shelling, DPM [facility]  Dr. Felecia Shelling, DPM    [address] [place], [place] [address]                Office [phone]  Fax [phone]

## 2023-05-24 ENCOUNTER — Other Ambulatory Visit: Payer: Self-pay | Admitting: Internal Medicine

## 2023-05-24 ENCOUNTER — Telehealth: Payer: Self-pay | Admitting: Urology

## 2023-05-24 NOTE — Telephone Encounter (Signed)
Requested Prescriptions  Pending Prescriptions Disp Refills   FLUoxetine (PROZAC) 40 MG capsule [Pharmacy Med Name: FLUOXETIN(P) CAP 40MG ] 90 capsule 1    Sig: TAKE 1 CAPSULE DAILY     Psychiatry:  Antidepressants - SSRI Passed - 05/24/2023 12:34 PM      Passed - Completed PHQ-2 or PHQ-9 in the last 360 days      Passed - Valid encounter within last 6 months    Recent Outpatient Visits           1 week ago Encounter for general adult medical examination with abnormal findings   [facility], Salvadore Oxford, NP   7 months ago Prediabetes   [facility] [place], Salvadore Oxford, NP   1 year ago Prediabetes   [facility] [place], Salvadore Oxford, NP   1 year ago Encounter for general adult medical examination with abnormal findings   [facility], Salvadore Oxford, NP   1 year ago Choledocholithiasis   [place] [facility] [place], Salvadore Oxford, NP       Future Appointments             In 5 months Baity, Salvadore Oxford, NP [place] [facility]

## 2023-05-24 NOTE — Telephone Encounter (Signed)
DOS - 06/20/23   EPF RIGHT --- [id number] GASTROCNEMIUS RECESS RIGHT --- [id number]  BCBS EFFECTIVE DATE - 07/27/07  DEDUCTIBLE - $350.00 W/ $0.00 REMAINING OOP - $6,000.00 W/ $4,230.93 REMAINING COINSURANCE - 0%  SPOKE WITH BRANDON WITH [employer] AND HE STATED THAT FOR CPT CODES 82956 AND [phone] NO PRIOR AUTH IS REQUIRED.  CALL REF # Apolinar Junes W. AT 05/24/23 AT 9:43AM EST

## 2023-06-10 ENCOUNTER — Encounter: Payer: Self-pay | Admitting: Internal Medicine

## 2023-06-10 HISTORY — PX: KNEE DISLOCATION SURGERY: SHX689

## 2023-06-10 HISTORY — PX: FOOT SURGERY: SHX648

## 2023-06-11 MED ORDER — SEMAGLUTIDE-WEIGHT MANAGEMENT 1.7 MG/0.75ML ~~LOC~~ SOAJ: 1.7000 mg | SUBCUTANEOUS | 0 refills | Status: DC

## 2023-06-20 ENCOUNTER — Other Ambulatory Visit: Payer: Self-pay | Admitting: Podiatry

## 2023-06-20 ENCOUNTER — Telehealth: Payer: Self-pay | Admitting: Podiatry

## 2023-06-20 ENCOUNTER — Encounter: Payer: Self-pay | Admitting: *Deleted

## 2023-06-20 DIAGNOSIS — M722 Plantar fascial fibromatosis: Secondary | ICD-10-CM | POA: Diagnosis not present

## 2023-06-20 DIAGNOSIS — M216X1 Other acquired deformities of right foot: Secondary | ICD-10-CM

## 2023-06-20 DIAGNOSIS — M205X1 Other deformities of toe(s) (acquired), right foot: Secondary | ICD-10-CM | POA: Diagnosis not present

## 2023-06-20 DIAGNOSIS — S83004A Unspecified dislocation of right patella, initial encounter: Secondary | ICD-10-CM | POA: Diagnosis not present

## 2023-06-20 DIAGNOSIS — G8918 Other acute postprocedural pain: Secondary | ICD-10-CM | POA: Diagnosis not present

## 2023-06-20 DIAGNOSIS — M25561 Pain in right knee: Secondary | ICD-10-CM | POA: Diagnosis not present

## 2023-06-20 MED ORDER — MELOXICAM 15 MG PO TABS: 15.0000 mg | ORAL_TABLET | Freq: Every day | ORAL | 1 refills | Status: DC

## 2023-06-20 MED ORDER — OXYCODONE-ACETAMINOPHEN 5-325 MG PO TABS: 1.0000 | ORAL_TABLET | ORAL | 0 refills | Status: DC | PRN

## 2023-06-20 NOTE — Progress Notes (Signed)
PRN postop 

## 2023-06-20 NOTE — Telephone Encounter (Signed)
Patient called the office today noting that she had surgery this morning by Dr. Logan Bores.  States that when she was getting up to go to the bathroom at home she fell and hurt her knee.  She states that she feels it dislocated and continuingly dislocates since the original injury today.  She has pain in the knee.  She is unsure whether she injured the foot because it still numb from the regional block.  She notes that she had an EPF and gastroc recession performed and is in a boot.  She stated that she will remove the boot when she gets off the phone to check to make sure there is no bleedthrough on the dressing.  She was worried that she may have injured the surgical area.  She does have a history of knee dislocation a long time ago which is why she has knowledge of it dislocating now.  She does plan to call orthopedic specialist to have the knee evaluated.  Patient being unable to ascertain whether she has any increased pain in the foot since she still has the regional block and the fact, she was informed to call the office if there is significant pain once the anesthesia wears off.  Informed the patient that it is highly unlikely that she further damaged any surgical area since these are small incisions that involve release of soft tissue only.  Informed the patient that we would let her surgeon know what happened today.  She will follow-up as scheduled at this time.

## 2023-06-26 ENCOUNTER — Ambulatory Visit (INDEPENDENT_AMBULATORY_CARE_PROVIDER_SITE_OTHER): Payer: Federal, State, Local not specified - PPO | Admitting: Podiatry

## 2023-06-26 DIAGNOSIS — M722 Plantar fascial fibromatosis: Secondary | ICD-10-CM

## 2023-06-26 DIAGNOSIS — M25561 Pain in right knee: Secondary | ICD-10-CM | POA: Diagnosis not present

## 2023-06-28 DIAGNOSIS — S83004A Unspecified dislocation of right patella, initial encounter: Secondary | ICD-10-CM | POA: Diagnosis not present

## 2023-07-03 ENCOUNTER — Ambulatory Visit (INDEPENDENT_AMBULATORY_CARE_PROVIDER_SITE_OTHER): Payer: Federal, State, Local not specified - PPO | Admitting: Podiatry

## 2023-07-03 ENCOUNTER — Encounter: Payer: Self-pay | Admitting: Podiatry

## 2023-07-03 VITALS — BP 158/84 | HR 80 | Ht 64.0 in

## 2023-07-03 DIAGNOSIS — M722 Plantar fascial fibromatosis: Secondary | ICD-10-CM

## 2023-07-08 NOTE — Progress Notes (Signed)
Chief Complaint  Patient presents with   Plantar Fasciitis    Pt came in for a post op and States that when she was getting up to go to the bathroom at home she fell and hurt her knee.  She states that she feels it dislocated and continuingly dislocates since the original injury today.  She has pain in the knee.      Subjective:  Patient presents today status post endoscopic plantar fasciotomy with gastroc aponeurosis lengthening right lower extremity.  DOS: 06/20/2023.  Patient states that she sustained a fall injury and hurt her right knee.  She has an appointment to have it evaluated with an orthopedic specialist.  Past Medical History:  Diagnosis Date   Anxiety 06/01/2021   COVID 12/2020   headache, fever, stuffy nose, loss of taste and smell x few weeks all sx resolved except taste is altered   Depression 06/01/2021   Gastric ulcer    yrs ago no current problems per pt on 06-01-2021   GERD (gastroesophageal reflux disease) 06/01/2021   Hyperlipidemia 06/01/2021   Hypertension 06/01/2021   Hypothyroidism 06/01/2021   Menorrhagia 06/01/2021   Migraines 06/01/2021   regulae headaches also @ times   Nocturia 06/01/2021   Plantar fasciitis 06/01/2021   both feet right worse than left   Prediabetes    Sleep apnea 06/01/2021   cpap severe osa does not know cpap settings   SUI (stress urinary incontinence, female) 06/01/2021   Wears glasses 06/01/2021    Past Surgical History:  Procedure Laterality Date   ABDOMINAL HYSTERECTOMY     BLADDER SUSPENSION N/A 06/07/2021   Procedure: TRANSVAGINAL TAPE (TVT) PROCEDURE;  Surgeon: Mitchel Honour, DO;  Location: [facility];  Service: Gynecology;  Laterality: N/A;   CATARACT EXTRACTION, BILATERAL  07/11/2017   COLONOSCOPY WITH PROPOFOL N/A 06/18/2022   Procedure: COLONOSCOPY WITH BIOPSY;  Surgeon: Midge Minium, MD;  [facility];  Service: Endoscopy;  Laterality: N/A;   DILITATION & CURRETTAGE/HYSTROSCOPY  WITH NOVASURE ABLATION N/A 01/10/2017   Procedure: DILATATION & CURETTAGE/HYSTEROSCOPY WITH NOVASURE ABLATION;  Surgeon: Mitchel Honour, DO;  Location: [facility];  Service: Gynecology;  Laterality: N/A;   ERCP N/A 03/15/2022   Procedure: ENDOSCOPIC RETROGRADE CHOLANGIOPANCREATOGRAPHY (ERCP);  Surgeon: Midge Minium, MD;  [facility];  Service: Endoscopy;  Laterality: N/A;   EYE SURGERY     LAPAROSCOPIC TUBAL LIGATION Bilateral 01/10/2017   Procedure: LAPAROSCOPIC TUBAL LIGATION with Filshie Clips;  Surgeon: Mitchel Honour, DO;  Location: [facility];  Service: Gynecology;  Laterality: Bilateral;   LAPAROSCOPIC VAGINAL HYSTERECTOMY WITH SALPINGECTOMY Bilateral 06/07/2021   Procedure: LAPAROSCOPIC ASSISTED VAGINAL HYSTERECTOMY WITH BILATERAL SALPINGECTOMY;  Surgeon: Mitchel Honour, DO;  Location: [facility];  Service: Gynecology;  Laterality: Bilateral;   POLYPECTOMY N/A 06/18/2022   Procedure: POLYPECTOMY;  Surgeon: Midge Minium, MD;  Location: [facility];  Service: Endoscopy;  Laterality: N/A;   TUBAL LIGATION     WISDOM TOOTH EXTRACTION  2013    Allergies  Allergen Reactions   Azithromycin Hives    Objective/Physical Exam Neurovascular status intact.  Incision well coapted with sutures intact. No sign of infectious process noted. No dehiscence. No active bleeding noted.  Moderate edema noted to the surgical extremity.  Assessment: 1. s/p endoscopic plantar fasciotomy with gastroc aponeurosis lengthening right. DOS: 06/20/2023   Plan of Care:  -Patient was evaluated.  Dressings changed.  Clean dry intact -Continue NWB in the cam boot. -Patient has appointment  with orthopedic for right knee evaluation -Return to clinic next scheduled appointment postop  Felecia Shelling, DPM [facility]  Dr. Felecia Shelling, DPM    [address] [place], [place] [address]                 Office [phone]  Fax [phone]

## 2023-07-15 NOTE — Progress Notes (Signed)
No chief complaint on file.   Subjective:  Patient presents today status post endoscopic plantar fasciotomy with gastroc aponeurosis lengthening right lower extremity.  DOS: 06/20/2023.  Patient states that she sustained a fall injury and hurt her right knee.  She has an appointment to have it evaluated with an orthopedic specialist.  Past Medical History:  Diagnosis Date   Anxiety 06/01/2021   COVID 12/2020   headache, fever, stuffy nose, loss of taste and smell x few weeks all sx resolved except taste is altered   Depression 06/01/2021   Gastric ulcer    yrs ago no current problems per pt on 06-01-2021   GERD (gastroesophageal reflux disease) 06/01/2021   Hyperlipidemia 06/01/2021   Hypertension 06/01/2021   Hypothyroidism 06/01/2021   Menorrhagia 06/01/2021   Migraines 06/01/2021   regulae headaches also @ times   Nocturia 06/01/2021   Plantar fasciitis 06/01/2021   both feet right worse than left   Prediabetes    Sleep apnea 06/01/2021   cpap severe osa does not know cpap settings   SUI (stress urinary incontinence, female) 06/01/2021   Wears glasses 06/01/2021    Past Surgical History:  Procedure Laterality Date   ABDOMINAL HYSTERECTOMY     BLADDER SUSPENSION N/A 06/07/2021   Procedure: TRANSVAGINAL TAPE (TVT) PROCEDURE;  Surgeon: Mitchel Honour, DO;  Location: [facility];  Service: Gynecology;  Laterality: N/A;   CATARACT EXTRACTION, BILATERAL  07/11/2017   COLONOSCOPY WITH PROPOFOL N/A 06/18/2022   Procedure: COLONOSCOPY WITH BIOPSY;  Surgeon: Midge Minium, MD;  Location: [facility];  Service: Endoscopy;  Laterality: N/A;   DILITATION & CURRETTAGE/HYSTROSCOPY WITH NOVASURE ABLATION N/A 01/10/2017   Procedure: DILATATION & CURETTAGE/HYSTEROSCOPY WITH NOVASURE ABLATION;  Surgeon: Mitchel Honour, DO;  Location:  [facility];  Service: Gynecology;  Laterality: N/A;   ERCP N/A 03/15/2022   Procedure: ENDOSCOPIC RETROGRADE  CHOLANGIOPANCREATOGRAPHY (ERCP);  Surgeon: Midge Minium, MD;  Location: [facility];  Service: Endoscopy;  Laterality: N/A;   EYE SURGERY     LAPAROSCOPIC TUBAL LIGATION Bilateral 01/10/2017   Procedure: LAPAROSCOPIC TUBAL LIGATION with Filshie Clips;  Surgeon: Mitchel Honour, DO;  Location: [facility] ;  Service: Gynecology;  Laterality: Bilateral;   LAPAROSCOPIC VAGINAL HYSTERECTOMY WITH SALPINGECTOMY Bilateral 06/07/2021   Procedure: LAPAROSCOPIC ASSISTED VAGINAL HYSTERECTOMY WITH BILATERAL SALPINGECTOMY;  Surgeon: Mitchel Honour, DO;  Location: [facility];  Service: Gynecology;  Laterality: Bilateral;   POLYPECTOMY N/A 06/18/2022   Procedure: POLYPECTOMY;  Surgeon: Midge Minium, MD;  Location: [facility];  Service: Endoscopy;  Laterality: N/A;   TUBAL LIGATION     WISDOM TOOTH EXTRACTION  2013    Allergies  Allergen Reactions   Azithromycin Hives    Objective/Physical Exam Neurovascular status intact.  Incision well coapted with sutures intact. No sign of infectious process noted. No dehiscence. No active bleeding noted.  Moderate edema noted to the surgical extremity.  Assessment: 1. s/p endoscopic plantar fasciotomy with gastroc aponeurosis lengthening right. DOS: 06/20/2023   Plan of Care:  -Patient was evaluated.  Sutures and staples removed -Continue NWB in the cam boot. -Patient has appointment with orthopedic for right knee evaluation -Return to clinic next scheduled appointment postop  Felecia Shelling, DPM [facility]  Dr. Felecia Shelling, DPM    [address]  [place], [place] [address]                Office [phone]  Fax [phone]

## 2023-07-17 ENCOUNTER — Ambulatory Visit (INDEPENDENT_AMBULATORY_CARE_PROVIDER_SITE_OTHER): Payer: Federal, State, Local not specified - PPO | Admitting: Podiatry

## 2023-07-17 DIAGNOSIS — M62461 Contracture of muscle, right lower leg: Secondary | ICD-10-CM

## 2023-07-17 DIAGNOSIS — M722 Plantar fascial fibromatosis: Secondary | ICD-10-CM

## 2023-07-17 NOTE — Progress Notes (Signed)
Chief Complaint  Patient presents with   Post-op Follow-up    She has sharp pain on Monday, just sitting. It ended up being bad it was throbbing and felt like it was "on fire". Later in the night she took pain medication. That has improved. This occurred on her first day back at work. No fevers or chills.     Subjective:  Patient presents today status post endoscopic plantar fasciotomy with gastroc aponeurosis lengthening right lower extremity.  DOS: 06/20/2023.  Patient states that she is doing very well.  She has no pain or tenderness associated to the plantar aspect of the foot. Patient has started physical therapy to the right knee.  She sustained an injury to the right knee postoperatively when she fell.  Overall though she is very satisfied with the outcome of surgery and her progress so far  Past Medical History:  Diagnosis Date   Anxiety 06/01/2021   COVID 12/2020   headache, fever, stuffy nose, loss of taste and smell x few weeks all sx resolved except taste is altered   Depression 06/01/2021   Gastric ulcer    yrs ago no current problems per pt on 06-01-2021   GERD (gastroesophageal reflux disease) 06/01/2021   Hyperlipidemia 06/01/2021   Hypertension 06/01/2021   Hypothyroidism 06/01/2021   Menorrhagia 06/01/2021   Migraines 06/01/2021   regulae headaches also @ times   Nocturia 06/01/2021   Plantar fasciitis 06/01/2021   both feet right worse than left   Prediabetes    Sleep apnea 06/01/2021   cpap severe osa does not know cpap settings   SUI (stress urinary incontinence, female) 06/01/2021   Wears glasses 06/01/2021    Past Surgical History:  Procedure Laterality Date   ABDOMINAL HYSTERECTOMY     BLADDER SUSPENSION N/A 06/07/2021   Procedure: TRANSVAGINAL TAPE (TVT) PROCEDURE;  Surgeon: Mitchel Honour, DO;  Location: [facility];  Service: Gynecology;  Laterality: N/A;   CATARACT EXTRACTION, BILATERAL  07/11/2017   COLONOSCOPY WITH PROPOFOL N/A  06/18/2022   Procedure: COLONOSCOPY WITH BIOPSY;  Surgeon: Midge Minium, MD;  [facility];  Service: Endoscopy;  Laterality: N/A;   DILITATION & CURRETTAGE/HYSTROSCOPY WITH NOVASURE ABLATION N/A 01/10/2017   Procedure: DILATATION & CURETTAGE/HYSTEROSCOPY WITH NOVASURE ABLATION;  Surgeon: Mitchel Honour, DO;  Location: [facility];  Service: Gynecology;  Laterality: N/A;   ERCP N/A 03/15/2022   Procedure: ENDOSCOPIC RETROGRADE CHOLANGIOPANCREATOGRAPHY (ERCP);  Surgeon: Midge Minium, MD;  Location: [facility];  Service: Endoscopy;  Laterality: N/A;   EYE SURGERY     LAPAROSCOPIC TUBAL LIGATION Bilateral 01/10/2017   Procedure: LAPAROSCOPIC TUBAL LIGATION with Filshie Clips;  Surgeon: Mitchel Honour, DO;  Location: [facility];  Service: Gynecology;  Laterality: Bilateral;   LAPAROSCOPIC VAGINAL HYSTERECTOMY WITH SALPINGECTOMY Bilateral 06/07/2021   Procedure: LAPAROSCOPIC ASSISTED VAGINAL HYSTERECTOMY WITH BILATERAL SALPINGECTOMY;  Surgeon: Mitchel Honour, DO;  Location: [facility];  Service: Gynecology;  Laterality: Bilateral;   POLYPECTOMY N/A 06/18/2022   Procedure: POLYPECTOMY;  Surgeon: Midge Minium, MD;  Location: [facility];  Service: Endoscopy;  Laterality: N/A;   TUBAL LIGATION     WISDOM TOOTH EXTRACTION  2013    Allergies  Allergen Reactions   Azithromycin Hives    Objective/Physical Exam Neurovascular status intact.  All incisions are nicely healed.  There is no tenderness with palpation along the plantar aspect of the foot or posterior leg/calf  Assessment: 1. s/p endoscopic plantar fasciotomy with gastroc aponeurosis lengthening right.  DOS: 06/20/2023   Plan of Care:  -Patient was evaluated.   -Patient actually presented today wearing tennis shoes.  Patient may discontinue the cam boot.  Recommend good supportive tennis shoes and sneakers.  Advised against going barefoot -Order placed for physical therapy.   Patient is going to physical therapy for her right knee at [facility].  She may continue physical therapy there for postoperative protocol for the plantar fascial surgery and gastroc recession -Return to clinic 8 weeks  Felecia Shelling, DPM [facility]  Dr. Felecia Shelling, DPM    [address] [place], [place] [address]                Office [phone]  Fax [phone]

## 2023-07-18 DIAGNOSIS — M25571 Pain in right ankle and joints of right foot: Secondary | ICD-10-CM | POA: Diagnosis not present

## 2023-07-18 DIAGNOSIS — S83004D Unspecified dislocation of right patella, subsequent encounter: Secondary | ICD-10-CM | POA: Diagnosis not present

## 2023-07-22 ENCOUNTER — Other Ambulatory Visit: Payer: Self-pay | Admitting: Internal Medicine

## 2023-07-23 ENCOUNTER — Other Ambulatory Visit: Payer: Self-pay | Admitting: Internal Medicine

## 2023-07-23 DIAGNOSIS — M25571 Pain in right ankle and joints of right foot: Secondary | ICD-10-CM | POA: Diagnosis not present

## 2023-07-23 DIAGNOSIS — S83004D Unspecified dislocation of right patella, subsequent encounter: Secondary | ICD-10-CM | POA: Diagnosis not present

## 2023-07-23 DIAGNOSIS — K219 Gastro-esophageal reflux disease without esophagitis: Secondary | ICD-10-CM

## 2023-07-23 NOTE — Telephone Encounter (Signed)
Requested Prescriptions  Pending Prescriptions Disp Refills   levothyroxine (SYNTHROID) 88 MCG tablet [Pharmacy Med Name: LEVOTHYROXINE SODIUM TABLET] 90 tablet 2    Sig: TAKE ONE TABLET (88 MCG TOTAL) BY MOUTH DAILY.     Endocrinology:  Hypothyroid Agents Passed - 07/22/2023  9:26 AM      Passed - TSH in normal range and within 360 days    TSH  Date Value Ref Range Status  05/17/2023 1.88 mIU/L Final    Comment:              Reference Range .           > or = 20 Years  0.40-4.50 .                Pregnancy Ranges           First trimester    0.26-2.66           Second trimester   0.55-2.73           Third trimester    0.43-2.91          Passed - Valid encounter within last 12 months    Recent Outpatient Visits           2 months ago Encounter for general adult medical examination with abnormal findings   [place] [facility] [place], Salvadore Oxford, NP   9 months ago Prediabetes   [place] [facility] [place], Salvadore Oxford, NP   1 year ago Prediabetes   [place] [facility] [place], Salvadore Oxford, NP   1 year ago Encounter for general adult medical examination with abnormal findings   [place] [facility] [place], Salvadore Oxford, NP   1 year ago Choledocholithiasis   [place] [facility] [place], Salvadore Oxford, NP       Future Appointments             In 3 months Baity, Salvadore Oxford, NP [facility], [facility]

## 2023-07-24 NOTE — Telephone Encounter (Signed)
Requested Prescriptions  Pending Prescriptions Disp Refills   omeprazole (PRILOSEC) 20 MG capsule [Pharmacy Med Name: OMEPRAZOLE 20MG  CAPSULE DR] 90 capsule 0    Sig: TAKE ONE CAPSULE BY MOUTH ONCE DAILY     Gastroenterology: Proton Pump Inhibitors Passed - 07/23/2023  9:55 AM      Passed - Valid encounter within last 12 months    Recent Outpatient Visits           2 months ago Encounter for general adult medical examination with abnormal findings   [place] [facility] [place], Salvadore Oxford, NP   9 months ago Prediabetes   [place] [facility] [place], Salvadore Oxford, NP   1 year ago Prediabetes   [place] [facility] [place], Salvadore Oxford, NP   1 year ago Encounter for general adult medical examination with abnormal findings   [place] [facility] [place], Salvadore Oxford, NP   1 year ago Choledocholithiasis   [place] [facility] [place], Salvadore Oxford, NP       Future Appointments             In 3 months Baity, Salvadore Oxford, NP [place] [facility], [facility]

## 2023-07-25 DIAGNOSIS — S83004D Unspecified dislocation of right patella, subsequent encounter: Secondary | ICD-10-CM | POA: Diagnosis not present

## 2023-07-25 DIAGNOSIS — M25571 Pain in right ankle and joints of right foot: Secondary | ICD-10-CM | POA: Diagnosis not present

## 2023-07-26 DIAGNOSIS — S83004A Unspecified dislocation of right patella, initial encounter: Secondary | ICD-10-CM | POA: Diagnosis not present

## 2023-07-31 DIAGNOSIS — M25571 Pain in right ankle and joints of right foot: Secondary | ICD-10-CM | POA: Diagnosis not present

## 2023-07-31 DIAGNOSIS — S83004D Unspecified dislocation of right patella, subsequent encounter: Secondary | ICD-10-CM | POA: Diagnosis not present

## 2023-08-02 DIAGNOSIS — M25571 Pain in right ankle and joints of right foot: Secondary | ICD-10-CM | POA: Diagnosis not present

## 2023-08-02 DIAGNOSIS — S83004D Unspecified dislocation of right patella, subsequent encounter: Secondary | ICD-10-CM | POA: Diagnosis not present

## 2023-08-05 DIAGNOSIS — S83004D Unspecified dislocation of right patella, subsequent encounter: Secondary | ICD-10-CM | POA: Diagnosis not present

## 2023-08-05 DIAGNOSIS — M25571 Pain in right ankle and joints of right foot: Secondary | ICD-10-CM | POA: Diagnosis not present

## 2023-08-07 DIAGNOSIS — Z01419 Encounter for gynecological examination (general) (routine) without abnormal findings: Secondary | ICD-10-CM | POA: Diagnosis not present

## 2023-08-07 DIAGNOSIS — M25571 Pain in right ankle and joints of right foot: Secondary | ICD-10-CM | POA: Diagnosis not present

## 2023-08-07 DIAGNOSIS — Z6833 Body mass index (BMI) 33.0-33.9, adult: Secondary | ICD-10-CM | POA: Diagnosis not present

## 2023-08-07 DIAGNOSIS — Z1231 Encounter for screening mammogram for malignant neoplasm of breast: Secondary | ICD-10-CM | POA: Diagnosis not present

## 2023-08-07 DIAGNOSIS — S83004D Unspecified dislocation of right patella, subsequent encounter: Secondary | ICD-10-CM | POA: Diagnosis not present

## 2023-08-07 LAB — HM MAMMOGRAPHY

## 2023-08-13 DIAGNOSIS — M25571 Pain in right ankle and joints of right foot: Secondary | ICD-10-CM | POA: Diagnosis not present

## 2023-08-13 DIAGNOSIS — S83004D Unspecified dislocation of right patella, subsequent encounter: Secondary | ICD-10-CM | POA: Diagnosis not present

## 2023-08-15 DIAGNOSIS — M25571 Pain in right ankle and joints of right foot: Secondary | ICD-10-CM | POA: Diagnosis not present

## 2023-08-15 DIAGNOSIS — S83004D Unspecified dislocation of right patella, subsequent encounter: Secondary | ICD-10-CM | POA: Diagnosis not present

## 2023-08-19 ENCOUNTER — Other Ambulatory Visit: Payer: Self-pay | Admitting: Internal Medicine

## 2023-08-19 DIAGNOSIS — S83004D Unspecified dislocation of right patella, subsequent encounter: Secondary | ICD-10-CM | POA: Diagnosis not present

## 2023-08-19 DIAGNOSIS — M25571 Pain in right ankle and joints of right foot: Secondary | ICD-10-CM | POA: Diagnosis not present

## 2023-08-20 NOTE — Telephone Encounter (Signed)
Requested medications are due for refill today.  See note  Requested medications are on the active medications list.  yes  Last refill. 06/11/2023 3mL 0 rf  Future visit scheduled.   yes  Notes to clinic.  Unclear if pt is to stay at this dose. Please review for refill.    Requested Prescriptions  Pending Prescriptions Disp Refills   WEGOVY 1.7 MG/0.75ML SOAJ [Pharmacy Med Name: WEGOVY 1.7MG  SOLN AUTO-INJ] 3 mL 0    Sig: INJECT 1.7 MG INTO THE SKIN ONCE A WEEK.     Endocrinology:  Diabetes - GLP-1 Receptor Agonists - semaglutide Passed - 08/19/2023  8:17 AM      Passed - HBA1C in normal range and within 180 days    Hgb A1c MFr Bld  Date Value Ref Range Status  05/17/2023 5.6 <5.7 % of total Hgb Final    Comment:    For the purpose of screening for the presence of diabetes: . <5.7%       Consistent with the absence of diabetes 5.7-6.4%    Consistent with increased risk for diabetes             (prediabetes) > or =6.5%  Consistent with diabetes . This assay result is consistent with a decreased risk of diabetes. . Currently, no consensus exists regarding use of hemoglobin A1c for diagnosis of diabetes in children. . According to American Diabetes Association (ADA) guidelines, hemoglobin A1c <7.0% represents optimal control in non-pregnant diabetic patients. Different metrics may apply to specific patient populations.  Standards of Medical Care in Diabetes(ADA). .          Passed - Cr in normal range and within 360 days    Creat  Date Value Ref Range Status  05/17/2023 0.82 0.50 - 1.03 mg/dL Final         Passed - Valid encounter within last 6 months    Recent Outpatient Visits           3 months ago Encounter for general adult medical examination with abnormal findings   [place] [facility] [place], Salvadore Oxford, NP   10 months ago Prediabetes   [place] [facility] [place], Salvadore Oxford, NP   1 year ago Prediabetes   [place]  [facility] [place], Salvadore Oxford, NP   1 year ago Encounter for general adult medical examination with abnormal findings   [place] [facility] [place], Salvadore Oxford, NP   1 year ago Choledocholithiasis   [place] [facility] [place], Salvadore Oxford, NP       Future Appointments             In 3 months Baity, Salvadore Oxford, NP [place] [facility], [facility]

## 2023-08-21 ENCOUNTER — Telehealth: Payer: Federal, State, Local not specified - PPO | Admitting: Emergency Medicine

## 2023-08-21 DIAGNOSIS — R21 Rash and other nonspecific skin eruption: Secondary | ICD-10-CM

## 2023-08-21 DIAGNOSIS — S83004D Unspecified dislocation of right patella, subsequent encounter: Secondary | ICD-10-CM | POA: Diagnosis not present

## 2023-08-21 DIAGNOSIS — M25571 Pain in right ankle and joints of right foot: Secondary | ICD-10-CM | POA: Diagnosis not present

## 2023-08-21 NOTE — Progress Notes (Signed)
E Visit for Rash  We are sorry that you are not feeling well. Here is how we plan to help!  I'd recommend putting hydrocortisone cream on this rash for starters.  It is available over the counter at any pharmacy or grocery store.  There are some characteristics that make me concerned about shingles, but with a single lesion, it's hard to say for certain.  If you develop more lesions (bumps), or if the rash becomes painful, please reply back and we will modify your treatment plan.   HOME CARE:  Take cool showers and avoid direct sunlight. Apply cool compress or wet dressings. Take a bath in an oatmeal bath.  Sprinkle content of one Aveeno packet under running faucet with comfortably warm water.  Bathe for 15-20 minutes, 1-2 times daily.  Pat dry with a towel. Do not rub the rash. Use hydrocortisone cream. Take an antihistamine like Benadryl for widespread rashes that itch.  The adult dose of Benadryl is 25-50 mg by mouth 4 times daily. Caution:  This type of medication may cause sleepiness.  Do not drink alcohol, drive, or operate dangerous machinery while taking antihistamines.  Do not take these medications if you have prostate enlargement.  Read package instructions thoroughly on all medications that you take.  GET HELP RIGHT AWAY IF:  Symptoms don't go away after treatment. Severe itching that persists. If you rash spreads or swells. If you rash begins to smell. If it blisters and opens or develops a yellow-brown crust. You develop a fever. You have a sore throat. You become short of breath.  MAKE SURE YOU:  Understand these instructions. Will watch your condition. Will get help right away if you are not doing well or get worse.  Thank you for choosing an e-visit.  Your e-visit answers were reviewed by a board certified advanced clinical practitioner to complete your personal care plan. Depending upon the condition, your plan could have included both over the counter or  prescription medications.  Please review your pharmacy choice. Make sure the pharmacy is open so you can pick up prescription now. If there is a problem, you may contact your provider through [employer] and have the prescription routed to another pharmacy.  Your safety is important to [place]. If you have drug allergies check your prescription carefully.   For the next 24 hours you can use MyChart to ask questions about today's visit, request a non-urgent call back, or ask for a work or school excuse. You will get an email in the next two days asking about your experience. I hope that your e-visit has been valuable and will speed your recovery.  Approximately 5 minutes was used in reviewing the patient's chart, questionnaire, prescribing medications, and documentation.

## 2023-08-26 ENCOUNTER — Other Ambulatory Visit: Payer: Self-pay | Admitting: Internal Medicine

## 2023-08-26 DIAGNOSIS — F419 Anxiety disorder, unspecified: Secondary | ICD-10-CM

## 2023-08-27 NOTE — Telephone Encounter (Signed)
Requested Prescriptions  Pending Prescriptions Disp Refills   buPROPion (WELLBUTRIN SR) 150 MG 12 hr tablet [Pharmacy Med Name: BUPROPION HYDROCHLORIDE ER (SR) 150MG  SR TABLET ER 12HR] 90 tablet 0    Sig: TAKE ONE TABLET (150 MG TOTAL) BY MOUTH DAILY. PLEASE SCHEDULE AN ANNUAL WELLNESS IN MAY 2024.     Psychiatry: Antidepressants - bupropion Failed - 08/26/2023 11:22 AM      Failed - Last BP in normal range    BP Readings from Last 1 Encounters:  07/03/23 (!) 158/84         Passed - Cr in normal range and within 360 days    Creat  Date Value Ref Range Status  05/17/2023 0.82 0.50 - 1.03 mg/dL Final         Passed - AST in normal range and within 360 days    AST  Date Value Ref Range Status  05/17/2023 15 10 - 35 U/L Final         Passed - ALT in normal range and within 360 days    ALT  Date Value Ref Range Status  05/17/2023 15 6 - 29 U/L Final         Passed - Completed PHQ-2 or PHQ-9 in the last 360 days      Passed - Valid encounter within last 6 months    Recent Outpatient Visits           3 months ago Encounter for general adult medical examination with abnormal findings   [place] [facility] [place], Salvadore Oxford, NP   10 months ago Prediabetes   [place] [facility] [place], Salvadore Oxford, NP   1 year ago Prediabetes   [place] [facility] [place], Salvadore Oxford, NP   1 year ago Encounter for general adult medical examination with abnormal findings   [place] [facility] [place], Salvadore Oxford, NP   1 year ago Choledocholithiasis   [place] [facility] [place], Salvadore Oxford, NP       Future Appointments             In 2 months Baity, Salvadore Oxford, NP  [facility], [facility]

## 2023-09-04 DIAGNOSIS — S83004D Unspecified dislocation of right patella, subsequent encounter: Secondary | ICD-10-CM | POA: Diagnosis not present

## 2023-09-06 NOTE — Progress Notes (Signed)
Outpatient surgery on 06/20/2023 at [facility]  Endoscopic Plantar Fasciotomy right foot Gastrocnemius Recess right foot

## 2023-09-09 DIAGNOSIS — S83004D Unspecified dislocation of right patella, subsequent encounter: Secondary | ICD-10-CM | POA: Diagnosis not present

## 2023-09-09 DIAGNOSIS — M25571 Pain in right ankle and joints of right foot: Secondary | ICD-10-CM | POA: Diagnosis not present

## 2023-09-11 ENCOUNTER — Ambulatory Visit (INDEPENDENT_AMBULATORY_CARE_PROVIDER_SITE_OTHER): Payer: Federal, State, Local not specified - PPO | Admitting: Podiatry

## 2023-09-11 DIAGNOSIS — S83004D Unspecified dislocation of right patella, subsequent encounter: Secondary | ICD-10-CM | POA: Diagnosis not present

## 2023-09-11 DIAGNOSIS — M25571 Pain in right ankle and joints of right foot: Secondary | ICD-10-CM | POA: Diagnosis not present

## 2023-09-11 DIAGNOSIS — M722 Plantar fascial fibromatosis: Secondary | ICD-10-CM

## 2023-09-11 MED ORDER — MELOXICAM 15 MG PO TABS: 15.0000 mg | ORAL_TABLET | Freq: Every day | ORAL | 1 refills | Status: DC

## 2023-09-11 NOTE — Progress Notes (Signed)
Chief Complaint  Patient presents with   Routine Post Op    Patient presents status post endoscopic plantar fasciotomy with gastroc aponeurosis lengthening right lower extremity.  DOS: 06/20/2023. -    A couple weeks ago had pain in the arch area-  last few days its been OK.    Seems to be worse when she wears her tennis shoes.        Subjective:  Patient presents today status post endoscopic plantar fasciotomy with gastroc aponeurosis lengthening right lower extremity.  DOS: 06/20/2023.  Patient has been going to physical therapy at [facility].  Ever since the patient has transition out of the boot into tennis shoes she has noticed some pain around the arch of the foot.  She is currently wearing [employer] running shoes with power step insoles.  Patient currently not taking any anti-inflammatory medication presenting for further treatment evaluation  Past Medical History:  Diagnosis Date   Anxiety 06/01/2021   COVID 12/2020   headache, fever, stuffy nose, loss of taste and smell x few weeks all sx resolved except taste is altered   Depression 06/01/2021   Gastric ulcer    yrs ago no current problems per pt on 06-01-2021   GERD (gastroesophageal reflux disease) 06/01/2021   Hyperlipidemia 06/01/2021   Hypertension 06/01/2021   Hypothyroidism 06/01/2021   Menorrhagia 06/01/2021   Migraines 06/01/2021   regulae headaches also @ times   Nocturia 06/01/2021   Plantar fasciitis 06/01/2021   both feet right worse than left   Prediabetes    Sleep apnea 06/01/2021   cpap severe osa does not know cpap settings   SUI (stress urinary incontinence, female) 06/01/2021   Wears glasses 06/01/2021    Past Surgical History:  Procedure Laterality Date   ABDOMINAL HYSTERECTOMY     BLADDER SUSPENSION N/A 06/07/2021   Procedure: TRANSVAGINAL TAPE (TVT) PROCEDURE;  Surgeon: Mitchel Honour, DO;  Location: [facility];  Service: Gynecology;  Laterality: N/A;   CATARACT EXTRACTION,  BILATERAL  07/11/2017   COLONOSCOPY WITH PROPOFOL N/A 06/18/2022   Procedure: COLONOSCOPY WITH BIOPSY;  Surgeon: Midge Minium, MD;  [facility];  Service: Endoscopy;  Laterality: N/A;   DILITATION & CURRETTAGE/HYSTROSCOPY WITH NOVASURE ABLATION N/A 01/10/2017   Procedure: DILATATION & CURETTAGE/HYSTEROSCOPY WITH NOVASURE ABLATION;  Surgeon: Mitchel Honour, DO;  Location: [facility];  Service: Gynecology;  Laterality: N/A;   ERCP N/A 03/15/2022   Procedure: ENDOSCOPIC RETROGRADE CHOLANGIOPANCREATOGRAPHY (ERCP);  Surgeon: Midge Minium, MD;  Location: [facility];  Service: Endoscopy;  Laterality: N/A;   EYE SURGERY     LAPAROSCOPIC TUBAL LIGATION Bilateral 01/10/2017   Procedure: LAPAROSCOPIC TUBAL LIGATION with Filshie Clips;  Surgeon: Mitchel Honour, DO;  Location: [facility];  Service: Gynecology;  Laterality: Bilateral;   LAPAROSCOPIC VAGINAL HYSTERECTOMY WITH SALPINGECTOMY Bilateral 06/07/2021   Procedure: LAPAROSCOPIC ASSISTED VAGINAL HYSTERECTOMY WITH BILATERAL SALPINGECTOMY;  Surgeon: Mitchel Honour, DO;  Location: [facility];  Service: Gynecology;  Laterality: Bilateral;   POLYPECTOMY N/A 06/18/2022   Procedure: POLYPECTOMY;  Surgeon: Midge Minium, MD;  Location: [facility];  Service: Endoscopy;  Laterality: N/A;   TUBAL LIGATION     WISDOM TOOTH EXTRACTION  2013    Allergies  Allergen Reactions   Azithromycin Hives    Objective/Physical Exam Neurovascular status intact.  All incisions are nicely healed.  There are some tenderness along the medial longitudinal arch of the right foot.  Muscle strength 5/5 all compartments.  No edema.  Assessment: 1. s/p endoscopic plantar fasciotomy with gastroc aponeurosis lengthening right. DOS: 06/20/2023   Plan of Care:  -Patient was evaluated.   - After discussing with the patient I do believe that possibly the power step insoles are creating the pain along the arch of the  foot.  Recommend discontinuing the power step insoles and simply wearing good supportive tennis shoes/running shoes with factory insoles.  Patient will do this to see if this helps alleviate her symptoms -Continue to advise against when barefoot -Continue physical therapy as needed -Refill prescription for meloxicam 15 mg daily as needed -Patient will plan to contact me via MyChart for an update in about 4 weeks  Felecia Shelling, DPM [facility]  Dr. Felecia Shelling, DPM    [address] [place], [place] [address]                Office [phone]  Fax [phone]

## 2023-09-17 DIAGNOSIS — S83004D Unspecified dislocation of right patella, subsequent encounter: Secondary | ICD-10-CM | POA: Diagnosis not present

## 2023-09-17 DIAGNOSIS — M25571 Pain in right ankle and joints of right foot: Secondary | ICD-10-CM | POA: Diagnosis not present

## 2023-09-19 DIAGNOSIS — M25571 Pain in right ankle and joints of right foot: Secondary | ICD-10-CM | POA: Diagnosis not present

## 2023-09-19 DIAGNOSIS — S83004D Unspecified dislocation of right patella, subsequent encounter: Secondary | ICD-10-CM | POA: Diagnosis not present

## 2023-09-23 DIAGNOSIS — S83004D Unspecified dislocation of right patella, subsequent encounter: Secondary | ICD-10-CM | POA: Diagnosis not present

## 2023-09-23 DIAGNOSIS — M25571 Pain in right ankle and joints of right foot: Secondary | ICD-10-CM | POA: Diagnosis not present

## 2023-09-24 ENCOUNTER — Other Ambulatory Visit: Payer: Self-pay | Admitting: Internal Medicine

## 2023-09-25 DIAGNOSIS — S83004D Unspecified dislocation of right patella, subsequent encounter: Secondary | ICD-10-CM | POA: Diagnosis not present

## 2023-09-25 DIAGNOSIS — M25571 Pain in right ankle and joints of right foot: Secondary | ICD-10-CM | POA: Diagnosis not present

## 2023-09-25 NOTE — Telephone Encounter (Signed)
Requested Prescriptions  Pending Prescriptions Disp Refills   lisinopril-hydrochlorothiazide (ZESTORETIC) 20-12.5 MG tablet [Pharmacy Med Name: LISINOPRIL/HYDROCHLOROTHIAZIDE 20-12.5 TABLET] 90 tablet 0    Sig: TAKE ONE TABLET BY MOUTH ONCE A DAY     Cardiovascular:  ACEI + Diuretic Combos Failed - 09/24/2023  5:12 PM      Failed - Last BP in normal range    BP Readings from Last 1 Encounters:  07/03/23 (!) 158/84         Passed - Na in normal range and within 180 days    Sodium  Date Value Ref Range Status  05/17/2023 139 135 - 146 mmol/L Final         Passed - K in normal range and within 180 days    Potassium  Date Value Ref Range Status  05/17/2023 4.0 3.5 - 5.3 mmol/L Final         Passed - Cr in normal range and within 180 days    Creat  Date Value Ref Range Status  05/17/2023 0.82 0.50 - 1.03 mg/dL Final         Passed - eGFR is 30 or above and within 180 days    GFR, Estimated  Date Value Ref Range Status  03/14/2022 >60 >60 mL/min Final    Comment:    (NOTE) Calculated using the CKD-EPI Creatinine Equation (2021)    GFR  Date Value Ref Range Status  03/09/2021 86.70 >60.00 mL/min Final    Comment:    Calculated using the CKD-EPI Creatinine Equation (2021)   eGFR  Date Value Ref Range Status  05/17/2023 87 > OR = 60 mL/min/1.60m2 Final         Passed - Patient is not pregnant      Passed - Valid encounter within last 6 months    Recent Outpatient Visits           4 months ago Encounter for general adult medical examination with abnormal findings   [place] [facility] [place], Salvadore Oxford, NP   11 months ago Prediabetes   [place] [facility] [place], Salvadore Oxford, NP   1 year ago Prediabetes   [place] [facility] [place], Salvadore Oxford, NP   1 year ago Encounter for general adult medical examination with abnormal findings   [place] [facility] [place], Salvadore Oxford, NP   1 year ago  Choledocholithiasis   [place] [facility] [place], Salvadore Oxford, NP       Future Appointments             In 1 month Baity, Salvadore Oxford, NP  [facility], [facility]             WEGOVY 1.7 MG/0.75ML Ivory Broad [Pharmacy Med Name: [facility] 1.7MG  SOLN AUTO-INJ] 3 mL 0    Sig: INJECT 1.7 MG INTO THE SKIN ONCE A WEEK.     Endocrinology:  Diabetes - GLP-1 Receptor Agonists - semaglutide Passed - 09/24/2023  5:12 PM      Passed - HBA1C in normal range and within 180 days    Hgb A1c MFr Bld  Date Value Ref Range Status  05/17/2023 5.6 <5.7 % of total Hgb Final    Comment:    For the purpose of screening for the presence of diabetes: . <5.7%       Consistent with the absence of diabetes 5.7-6.4%    Consistent with increased risk for  diabetes             (prediabetes) > or =6.5%  Consistent with diabetes . This assay result is consistent with a decreased risk of diabetes. . Currently, no consensus exists regarding use of hemoglobin A1c for diagnosis of diabetes in children. . According to American Diabetes Association (ADA) guidelines, hemoglobin A1c <7.0% represents optimal control in non-pregnant diabetic patients. Different metrics may apply to specific patient populations.  Standards of Medical Care in Diabetes(ADA). .          Passed - Cr in normal range and within 360 days    Creat  Date Value Ref Range Status  05/17/2023 0.82 0.50 - 1.03 mg/dL Final         Passed - Valid encounter within last 6 months    Recent Outpatient Visits           4 months ago Encounter for general adult medical examination with abnormal findings   [place] [facility] [place], Salvadore Oxford, NP   11 months ago Prediabetes   [facility], Salvadore Oxford, NP   1 year ago Prediabetes   [place] [facility] [place], Salvadore Oxford, NP   1 year ago Encounter for general adult medical examination with abnormal  findings   [place] [facility] [place], Salvadore Oxford, NP   1 year ago Choledocholithiasis   [facility] [place], Salvadore Oxford, NP       Future Appointments             In 1 month Baity, Salvadore Oxford, NP [place] [facility], [facility]

## 2023-09-27 ENCOUNTER — Encounter: Payer: Self-pay | Admitting: Internal Medicine

## 2023-10-01 DIAGNOSIS — M25571 Pain in right ankle and joints of right foot: Secondary | ICD-10-CM | POA: Diagnosis not present

## 2023-10-01 DIAGNOSIS — S83004D Unspecified dislocation of right patella, subsequent encounter: Secondary | ICD-10-CM | POA: Diagnosis not present

## 2023-10-04 DIAGNOSIS — M25571 Pain in right ankle and joints of right foot: Secondary | ICD-10-CM | POA: Diagnosis not present

## 2023-10-04 DIAGNOSIS — S83004D Unspecified dislocation of right patella, subsequent encounter: Secondary | ICD-10-CM | POA: Diagnosis not present

## 2023-10-08 DIAGNOSIS — M25571 Pain in right ankle and joints of right foot: Secondary | ICD-10-CM | POA: Diagnosis not present

## 2023-10-08 DIAGNOSIS — S83004D Unspecified dislocation of right patella, subsequent encounter: Secondary | ICD-10-CM | POA: Diagnosis not present

## 2023-10-09 DIAGNOSIS — S83004D Unspecified dislocation of right patella, subsequent encounter: Secondary | ICD-10-CM | POA: Diagnosis not present

## 2023-10-10 DIAGNOSIS — S83004D Unspecified dislocation of right patella, subsequent encounter: Secondary | ICD-10-CM | POA: Diagnosis not present

## 2023-10-10 DIAGNOSIS — M25571 Pain in right ankle and joints of right foot: Secondary | ICD-10-CM | POA: Diagnosis not present

## 2023-10-14 ENCOUNTER — Other Ambulatory Visit: Payer: Self-pay | Admitting: Surgery

## 2023-10-14 ENCOUNTER — Ambulatory Visit
Admission: RE | Admit: 2023-10-14 | Discharge: 2023-10-14 | Disposition: A | Discharge status: Home / Self Care | Payer: Federal, State, Local not specified - PPO | Source: Ambulatory Visit | Attending: Surgery | Admitting: Surgery

## 2023-10-14 DIAGNOSIS — K862 Cyst of pancreas: Secondary | ICD-10-CM | POA: Diagnosis not present

## 2023-10-14 DIAGNOSIS — R1011 Right upper quadrant pain: Secondary | ICD-10-CM | POA: Diagnosis not present

## 2023-10-14 DIAGNOSIS — Z9049 Acquired absence of other specified parts of digestive tract: Secondary | ICD-10-CM | POA: Diagnosis not present

## 2023-10-14 MED ORDER — GADOBUTROL 1 MMOL/ML IV SOLN
9.0000 mL | Freq: Once | INTRAVENOUS | Status: AC | PRN
  Administered 2023-10-14: 9 mL via INTRAVENOUS

## 2023-10-15 DIAGNOSIS — S83004D Unspecified dislocation of right patella, subsequent encounter: Secondary | ICD-10-CM | POA: Diagnosis not present

## 2023-10-15 DIAGNOSIS — M25571 Pain in right ankle and joints of right foot: Secondary | ICD-10-CM | POA: Diagnosis not present

## 2023-10-17 DIAGNOSIS — S83004D Unspecified dislocation of right patella, subsequent encounter: Secondary | ICD-10-CM | POA: Diagnosis not present

## 2023-10-17 DIAGNOSIS — M25571 Pain in right ankle and joints of right foot: Secondary | ICD-10-CM | POA: Diagnosis not present

## 2023-10-17 NOTE — Progress Notes (Unsigned)
Patient ID: Anne Mcguire, female   DOB: 02-11-1971, 52 y.o.   MRN: [id number]  Chief Complaint: Follow-up MRI pancreatic lesion  History of Present Illness REHAB Anne Mcguire is a 52 y.o. female with no complaints of epigastric pain, GI disturbance, back pain, weight loss, etc.  This is the second 11-month follow-up MRI/scan for incidental finding of pancreatic cystic lesions.  She has no complaints to speak of.  She presents today with her husband.   Past Medical History Past Medical History:  Diagnosis Date   Anxiety 06/01/2021   COVID 12/2020   headache, fever, stuffy nose, loss of taste and smell x few weeks all sx resolved except taste is altered   Depression 06/01/2021   Gastric ulcer    yrs ago no current problems per pt on 06-01-2021   GERD (gastroesophageal reflux disease) 06/01/2021   Hyperlipidemia 06/01/2021   Hypertension 06/01/2021   Hypothyroidism 06/01/2021   Menorrhagia 06/01/2021   Migraines 06/01/2021   regulae headaches also @ times   Nocturia 06/01/2021   Plantar fasciitis 06/01/2021   both feet right worse than left   Prediabetes    Sleep apnea 06/01/2021   cpap severe osa does not know cpap settings   SUI (stress urinary incontinence, female) 06/01/2021   Wears glasses 06/01/2021      Past Surgical History:  Procedure Laterality Date   ABDOMINAL HYSTERECTOMY     BLADDER SUSPENSION N/A 06/07/2021   Procedure: TRANSVAGINAL TAPE (TVT) PROCEDURE;  Surgeon: Mitchel Honour, DO;  Location: [facility];  Service: Gynecology;  Laterality: N/A;   CATARACT EXTRACTION, BILATERAL  07/11/2017   COLONOSCOPY WITH PROPOFOL N/A 06/18/2022   Procedure: COLONOSCOPY WITH BIOPSY;  Surgeon: Midge Minium, MD;  [facility];  Service: Endoscopy;  Laterality: N/A;   DILITATION & CURRETTAGE/HYSTROSCOPY WITH NOVASURE ABLATION N/A 01/10/2017   Procedure: DILATATION & CURETTAGE/HYSTEROSCOPY WITH NOVASURE ABLATION;  Surgeon: Mitchel Honour, DO;   Location: [facility];  Service: Gynecology;  Laterality: N/A;   ERCP N/A 03/15/2022   Procedure: ENDOSCOPIC RETROGRADE CHOLANGIOPANCREATOGRAPHY (ERCP);  Surgeon: Midge Minium, MD;  [facility]: [facility];  Service: Endoscopy;  Laterality: N/A;   EYE SURGERY     LAPAROSCOPIC TUBAL LIGATION Bilateral 01/10/2017   Procedure: LAPAROSCOPIC TUBAL LIGATION with Filshie Clips;  Surgeon: Mitchel Honour, DO;  Location[facility];  Service: Gynecology;  Laterality: Bilateral;   LAPAROSCOPIC VAGINAL HYSTERECTOMY WITH SALPINGECTOMY Bilateral 06/07/2021   Procedure: LAPAROSCOPIC ASSISTED VAGINAL HYSTERECTOMY WITH BILATERAL SALPINGECTOMY;  Surgeon: Mitchel Honour, DO;  Location: [facility];  Service: Gynecology;  Laterality: Bilateral;   POLYPECTOMY N/A 06/18/2022   Procedure: POLYPECTOMY;  Surgeon: Midge Minium, MD;  [facility];  Service: Endoscopy;  Laterality: N/A;   TUBAL LIGATION     WISDOM TOOTH EXTRACTION  2013    Allergies  Allergen Reactions   Azithromycin Hives    Current Outpatient Medications  Medication Sig Dispense Refill   atorvastatin (LIPITOR) 10 MG tablet Take 1 tablet (10 mg total) by mouth daily. 90 tablet 0   buPROPion (WELLBUTRIN SR) 150 MG 12 hr tablet TAKE ONE TABLET (150 MG TOTAL) BY MOUTH DAILY. PLEASE SCHEDULE AN ANNUAL WELLNESS IN MAY 2024. 90 tablet 0   busPIRone (BUSPAR) 10 MG tablet TAKE 1 TABLET BY MOUTH 3 TIMES DAILY 270 tablet 1   FLUoxetine (PROZAC) 40 MG capsule TAKE 1 CAPSULE DAILY 90 capsule 1   hydrOXYzine (ATARAX) 10 MG tablet Take 1 tablet (10 mg total) by  mouth daily as needed. 90 tablet 0   levothyroxine (SYNTHROID) 88 MCG tablet TAKE ONE TABLET (88 MCG TOTAL) BY MOUTH DAILY. 90 tablet 2   lisinopril-hydrochlorothiazide (ZESTORETIC) 20-12.5 MG tablet TAKE ONE TABLET BY MOUTH ONCE A DAY 90 tablet 0   meloxicam (MOBIC) 15 MG tablet Take 1 tablet (15 mg total) by mouth daily. 60 tablet 1   omeprazole  (PRILOSEC) 20 MG capsule TAKE ONE CAPSULE BY MOUTH ONCE DAILY 90 capsule 0   oxyCODONE-acetaminophen (PERCOCET) 5-325 MG tablet Take 1 tablet by mouth every 4 (four) hours as needed for severe pain. 30 tablet 0   WEGOVY 1.7 MG/0.75ML SOAJ INJECT 1.7 MG INTO THE SKIN ONCE A WEEK. 3 mL 0   No current facility-administered medications for this visit.    Family History Family History  Problem Relation Age of Onset   Mental illness Mother    Arthritis Father    Hyperlipidemia Father    Hypertension Father    Diabetes Father    Arthritis Maternal Grandmother    Diabetes Maternal Grandmother    Arthritis Maternal Grandfather    Diabetes Maternal Grandfather    Arthritis Paternal Grandmother    Breast cancer Paternal Grandmother       Social History Social History   Tobacco Use   Smoking status: Never   Smokeless tobacco: Never  Vaping Use   Vaping status: Never Used  Substance Use Topics   Alcohol use: Yes    Comment: occasional   Drug use: No    Physical Exam Last menstrual period 05/15/2021.    CONSTITUTIONAL: Well developed, and nourished, appropriately responsive and aware without distress.   EYES: Sclera non-icteric.   RESPIRATORY:  Normal respiratory effort without pathologic use of accessory muscles. CV: well perfused Abdomen: benign.   NEUROLOGIC:  Motor and sensation appear grossly normal.  Cranial nerves are grossly without defect. PSYCH:  Alert and oriented to person, place and time. Affect is appropriate for situation.  Data Reviewed I have personally reviewed what is currently available of the patient's imaging, recent labs and medical records.   Labs:     Latest Ref Rng & Units 05/17/2023    2:21 PM 10/10/2022    8:32 AM 03/15/2022    3:58 AM  CBC  WBC 3.8 - 10.8 Thousand/uL 7.2  6.8  9.7   Hemoglobin 11.7 - 15.5 g/dL 16.1  09.6  04.5   Hematocrit 35.0 - 45.0 % 42.6  40.6  37.4   Platelets 140 - 400 Thousand/uL 174  159  151       Latest Ref Rng &  Units 05/17/2023    2:21 PM 10/10/2022    8:32 AM 10/10/2022    8:26 AM  CMP  Glucose 65 - 139 mg/dL 80  83  85   BUN 7 - 25 mg/dL 12  17  17    Creatinine 0.50 - 1.03 mg/dL 4.09  8.11  9.14   Sodium 135 - 146 mmol/L 139  140  140   Potassium 3.5 - 5.3 mmol/L 4.0  3.8  3.8   Chloride 98 - 110 mmol/L 103  104  104   CO2 20 - 32 mmol/L 27  27  27    Calcium 8.6 - 10.4 mg/dL 9.5  9.1  9.0   Total Protein 6.1 - 8.1 g/dL 7.1  6.6  6.6   Total Bilirubin 0.2 - 1.2 mg/dL 0.7  0.5  0.5   AST 10 - 35 U/L 15  14  13   ALT 6 - 29 U/L 15  14  14     Imaging: Radiological images reviewed:   CLINICAL DATA:  Right upper quadrant pain, follow-up pancreatic cysts   EXAM: MRI ABDOMEN WITHOUT AND WITH CONTRAST   TECHNIQUE: Multiplanar multisequence MR imaging of the abdomen was performed both before and after the administration of intravenous contrast.   CONTRAST:  9mL GADAVIST GADOBUTROL 1 MMOL/ML IV SOLN   COMPARISON:  04/12/2023, 10/15/2022   FINDINGS: Lower chest: No acute abnormality.   Hepatobiliary: No focal liver abnormality is seen. Status post cholecystectomy. No biliary dilatation.   Pancreas: Unchanged, lobulated cystic lesion of the superior pancreatic neck with a thin internal septation measuring 2.0 x 1.2 cm (series 4, image 16). No solid component or suspicious contrast enhancement. No pancreatic ductal dilatation or surrounding inflammatory changes.   Spleen: Normal in size without significant abnormality.   Adrenals/Urinary Tract: Adrenal glands are unremarkable. Kidneys are normal, without renal calculi, solid lesion, or hydronephrosis.   Stomach/Bowel: Stomach is within normal limits. No evidence of bowel wall thickening, distention, or inflammatory changes.   Vascular/Lymphatic: No significant vascular findings are present. No enlarged abdominal lymph nodes.   Other: No abdominal wall hernia or abnormality. No ascites.   Musculoskeletal: No acute or significant  osseous findings.   IMPRESSION: 1. Unchanged, lobulated cystic lesion of the superior pancreatic neck with a thin internal septation measuring 2.0 x 1.2 cm. No solid component or suspicious contrast enhancement. No pancreatic ductal dilatation or surrounding inflammatory changes. This is most consistent with a benign side branch IPMN. Although this is highly likely to be benign, especially given initially established stability, recommend follow-up MRI/MRCP in 1 year to ensure continued stability. 2. Status post cholecystectomy.     Electronically Signed   By: Jearld Lesch M.D.   On: 10/17/2023 06:18  Assessment    Pancreatic cystic lesions stable to size/character over 54-month interval. Patient Active Problem List   Diagnosis Date Noted   Cystic mass of pancreas 10/25/2022   Iron deficiency anemia due to chronic blood loss 09/27/2021   Prediabetes 09/27/2021   Class 1 obesity due to excess calories with serious comorbidity and body mass index (BMI) of 33.0 to 33.9 in adult 09/27/2021   Severe sleep apnea 01/18/2021   HLD (hyperlipidemia) 08/26/2017   Migraines 08/26/2017   Essential hypertension 01/28/2017   Insomnia 04/02/2014   Anxiety and depression 02/19/2014   Hypothyroidism 02/19/2014   GERD (gastroesophageal reflux disease) 02/19/2014    Plan    We discussed the options of continued radiologic follow-up, versus referral to pancreatic surgeon.  She appreciate that while I do not do pancreatic surgery, am following her.  We feel it is prudent to continue to monitor these lesions at this time and not become overly aggressive and set further morbidity of an unnecessary procedure.  We would also be glad to refer her to a tertiary care center where an oncologic surgeon may be of greater experience following these lesions.  We will anticipate seeing her back in 75-months with a pre and postcontrast MRCP in follow-up.  Face-to-face time spent with the patient and  accompanying care providers(if present) was ** minutes, with more than 50% of the time spent counseling, educating, and coordinating care of the patient.    These notes generated with voice recognition software. I apologize for typographical errors.  Campbell Lerner M.D., FACS 10/17/2023, 10:07 AM

## 2023-10-24 ENCOUNTER — Encounter: Payer: Self-pay | Admitting: Surgery

## 2023-10-24 ENCOUNTER — Ambulatory Visit: Payer: Federal, State, Local not specified - PPO | Admitting: Surgery

## 2023-10-24 VITALS — BP 117/78 | HR 71 | Temp 98.0°F | Ht 64.0 in | Wt 195.0 lb

## 2023-10-24 DIAGNOSIS — K862 Cyst of pancreas: Secondary | ICD-10-CM | POA: Diagnosis not present

## 2023-10-25 DIAGNOSIS — M25571 Pain in right ankle and joints of right foot: Secondary | ICD-10-CM | POA: Diagnosis not present

## 2023-10-25 DIAGNOSIS — S83004D Unspecified dislocation of right patella, subsequent encounter: Secondary | ICD-10-CM | POA: Diagnosis not present

## 2023-10-28 DIAGNOSIS — S83004D Unspecified dislocation of right patella, subsequent encounter: Secondary | ICD-10-CM | POA: Diagnosis not present

## 2023-10-28 DIAGNOSIS — M25571 Pain in right ankle and joints of right foot: Secondary | ICD-10-CM | POA: Diagnosis not present

## 2023-10-31 ENCOUNTER — Other Ambulatory Visit: Payer: Self-pay | Admitting: Internal Medicine

## 2023-10-31 DIAGNOSIS — M25571 Pain in right ankle and joints of right foot: Secondary | ICD-10-CM | POA: Diagnosis not present

## 2023-10-31 DIAGNOSIS — S83004D Unspecified dislocation of right patella, subsequent encounter: Secondary | ICD-10-CM | POA: Diagnosis not present

## 2023-11-01 NOTE — Telephone Encounter (Signed)
Requested Prescriptions  Pending Prescriptions Disp Refills   atorvastatin (LIPITOR) 10 MG tablet [Pharmacy Med Name: ATORVASTATIN CALCIUM 10MG  TABLET] 90 tablet 0    Sig: TAKE ONE TABLET (10 MG TOTAL) BY MOUTH DAILY.     Cardiovascular:  Antilipid - Statins Failed - 10/31/2023 10:09 AM      Failed - Lipid Panel in normal range within the last 12 months    Cholesterol  Date Value Ref Range Status  05/17/2023 172 <200 mg/dL Final   LDL Cholesterol (Calc)  Date Value Ref Range Status  05/17/2023 95 mg/dL (calc) Final    Comment:    Reference range: <100 . Desirable range <100 mg/dL for primary prevention;   <70 mg/dL for patients with CHD or diabetic patients  with > or = 2 CHD risk factors. Marland Kitchen LDL-C is now calculated using the Martin-Hopkins  calculation, which is a validated novel method providing  better accuracy than the Friedewald equation in the  estimation of LDL-C.  Horald Pollen et al. Lenox Ahr. 1610;960(45): 2061-2068  (http://education.[employer].com/faq/FAQ164)    HDL  Date Value Ref Range Status  05/17/2023 56 > OR = 50 mg/dL Final   Triglycerides  Date Value Ref Range Status  05/17/2023 115 <150 mg/dL Final         Passed - Patient is not pregnant      Passed - Valid encounter within last 12 months    Recent Outpatient Visits           5 months ago Encounter for general adult medical examination with abnormal findings   [place] [facility] [place], Salvadore Oxford, NP   1 year ago Prediabetes   [place] [facility] [place], Salvadore Oxford, NP   1 year ago Prediabetes   [place] [facility] [place], Salvadore Oxford, NP   1 year ago Encounter for general adult medical examination with abnormal findings   [place] [facility] [place], Salvadore Oxford, NP   1 year ago Choledocholithiasis   [place] [facility] [place], Salvadore Oxford, NP       Future Appointments             In 2 weeks Sampson Si,  Salvadore Oxford, NP  [facility]             WEGOVY 1.7 MG/0.75ML Ivory Broad [Pharmacy Med Name: [facility] 1.7MG  SOLN AUTO-INJ] 3 mL 0    Sig: INJECT 1.7 MG INTO THE SKIN ONCE A WEEK.     Endocrinology:  Diabetes - GLP-1 Receptor Agonists - semaglutide Passed - 10/31/2023 10:09 AM      Passed - HBA1C in normal range and within 180 days    Hgb A1c MFr Bld  Date Value Ref Range Status  05/17/2023 5.6 <5.7 % of total Hgb Final    Comment:    For the purpose of screening for the presence of diabetes: . <5.7%       Consistent with the absence of diabetes 5.7-6.4%    Consistent with increased risk for diabetes             (prediabetes) > or =6.5%  Consistent with diabetes . This assay result is consistent with a decreased risk of diabetes. . Currently, no consensus exists regarding use of hemoglobin A1c for diagnosis of diabetes in children. . According to American Diabetes Association (ADA) guidelines, hemoglobin A1c <7.0% represents optimal control in non-pregnant diabetic patients. Different metrics may apply  to specific patient populations.  Standards of Medical Care in Diabetes(ADA). .          Passed - Cr in normal range and within 360 days    Creat  Date Value Ref Range Status  05/17/2023 0.82 0.50 - 1.03 mg/dL Final         Passed - Valid encounter within last 6 months    Recent Outpatient Visits           5 months ago Encounter for general adult medical examination with abnormal findings   [place] [facility] [place], Salvadore Oxford, NP   1 year ago Prediabetes   [place] [facility] [place], Salvadore Oxford, NP   1 year ago Prediabetes   [place] [facility] [place], Salvadore Oxford, NP   1 year ago Encounter for general adult medical examination with abnormal findings   [place] [facility] [place], Salvadore Oxford, NP   1 year ago Choledocholithiasis   [place] [facility]  [place], Salvadore Oxford, NP       Future Appointments             In 2 weeks Sampson Si, Salvadore Oxford, NP [place] [facility], [facility]

## 2023-11-11 DIAGNOSIS — M25571 Pain in right ankle and joints of right foot: Secondary | ICD-10-CM | POA: Diagnosis not present

## 2023-11-11 DIAGNOSIS — S83004D Unspecified dislocation of right patella, subsequent encounter: Secondary | ICD-10-CM | POA: Diagnosis not present

## 2023-11-19 ENCOUNTER — Ambulatory Visit: Payer: Federal, State, Local not specified - PPO | Admitting: Internal Medicine

## 2023-11-19 DIAGNOSIS — M25571 Pain in right ankle and joints of right foot: Secondary | ICD-10-CM | POA: Diagnosis not present

## 2023-11-19 DIAGNOSIS — S83004D Unspecified dislocation of right patella, subsequent encounter: Secondary | ICD-10-CM | POA: Diagnosis not present

## 2023-11-20 ENCOUNTER — Other Ambulatory Visit: Payer: Self-pay | Admitting: Internal Medicine

## 2023-11-21 NOTE — Telephone Encounter (Signed)
Requested Prescriptions  Pending Prescriptions Disp Refills   FLUoxetine (PROZAC) 40 MG capsule [Pharmacy Med Name: FLUOXETIN(P) CAP 40MG ] 90 capsule 0    Sig: TAKE 1 CAPSULE DAILY     Psychiatry:  Antidepressants - SSRI Failed - 11/21/2023 11:08 AM      Failed - Valid encounter within last 6 months    Recent Outpatient Visits           6 months ago Encounter for general adult medical examination with abnormal findings   [facility] [place], Salvadore Oxford, NP   1 year ago Prediabetes   [facility] [place], Salvadore Oxford, NP   1 year ago Prediabetes   [facility] [place], Salvadore Oxford, NP   1 year ago Encounter for general adult medical examination with abnormal findings   [facility] [place], Salvadore Oxford, NP   1 year ago Choledocholithiasis   [facility] [facility] [place], Salvadore Oxford, NP              Passed - Completed PHQ-2 or PHQ-9 in the last 360 days

## 2023-12-02 ENCOUNTER — Ambulatory Visit (INDEPENDENT_AMBULATORY_CARE_PROVIDER_SITE_OTHER): Payer: Federal, State, Local not specified - PPO | Admitting: Internal Medicine

## 2023-12-02 ENCOUNTER — Encounter: Payer: Self-pay | Admitting: Internal Medicine

## 2023-12-02 VITALS — BP 118/78 | Ht 64.0 in | Wt 195.8 lb

## 2023-12-02 DIAGNOSIS — G8929 Other chronic pain: Secondary | ICD-10-CM | POA: Insufficient documentation

## 2023-12-02 DIAGNOSIS — E6609 Other obesity due to excess calories: Secondary | ICD-10-CM

## 2023-12-02 DIAGNOSIS — F419 Anxiety disorder, unspecified: Secondary | ICD-10-CM

## 2023-12-02 DIAGNOSIS — E039 Hypothyroidism, unspecified: Secondary | ICD-10-CM

## 2023-12-02 DIAGNOSIS — E66811 Obesity, class 1: Secondary | ICD-10-CM

## 2023-12-02 DIAGNOSIS — R7303 Prediabetes: Secondary | ICD-10-CM | POA: Diagnosis not present

## 2023-12-02 DIAGNOSIS — E7800 Pure hypercholesterolemia, unspecified: Secondary | ICD-10-CM | POA: Diagnosis not present

## 2023-12-02 DIAGNOSIS — G43C1 Periodic headache syndromes in child or adult, intractable: Secondary | ICD-10-CM

## 2023-12-02 DIAGNOSIS — N912 Amenorrhea, unspecified: Secondary | ICD-10-CM | POA: Diagnosis not present

## 2023-12-02 DIAGNOSIS — M25511 Pain in right shoulder: Secondary | ICD-10-CM

## 2023-12-02 DIAGNOSIS — F32A Depression, unspecified: Secondary | ICD-10-CM

## 2023-12-02 DIAGNOSIS — F5101 Primary insomnia: Secondary | ICD-10-CM

## 2023-12-02 DIAGNOSIS — Z6833 Body mass index (BMI) 33.0-33.9, adult: Secondary | ICD-10-CM

## 2023-12-02 DIAGNOSIS — K219 Gastro-esophageal reflux disease without esophagitis: Secondary | ICD-10-CM

## 2023-12-02 DIAGNOSIS — G4730 Sleep apnea, unspecified: Secondary | ICD-10-CM

## 2023-12-02 DIAGNOSIS — L708 Other acne: Secondary | ICD-10-CM | POA: Diagnosis not present

## 2023-12-02 DIAGNOSIS — I10 Essential (primary) hypertension: Secondary | ICD-10-CM

## 2023-12-02 MED ORDER — BUPROPION HCL ER (XL) 300 MG PO TB24: 300.0000 mg | ORAL_TABLET | Freq: Every day | ORAL | 1 refills | Status: DC

## 2023-12-02 MED ORDER — SPIRONOLACTONE 50 MG PO TABS: 50.0000 mg | ORAL_TABLET | Freq: Every day | ORAL | 0 refills | Status: DC

## 2023-12-02 MED ORDER — MINOCYCLINE HCL 100 MG PO CAPS: 100.0000 mg | ORAL_CAPSULE | Freq: Every day | ORAL | 0 refills | Status: DC

## 2023-12-02 NOTE — Assessment & Plan Note (Signed)
TSH and free T4 today We will adjust levothyroxine if needed based on labs 

## 2023-12-02 NOTE — Assessment & Plan Note (Signed)
Currently not an issue °We will monitor °

## 2023-12-02 NOTE — Assessment & Plan Note (Signed)
Continue meloxicam

## 2023-12-02 NOTE — Assessment & Plan Note (Signed)
A1c today Continue Ozempic Encouraged diet and exercise for weight loss

## 2023-12-02 NOTE — Assessment & Plan Note (Signed)
Encouraged weight loss as this can help reduce sleep apnea symptoms Encourage compliance with CPAP

## 2023-12-02 NOTE — Progress Notes (Signed)
Subjective:    Patient ID: Anne Mcguire, female    DOB: 1971/05/18, 52 y.o.   MRN: [id number]  HPI  Patient presents to clinic today for 56-month follow-up of chronic conditions.  HTN: Her BP today is 118/78.  She is taking lisinopril hct as prescribed.  ECG from 03/2022 reviewed.  Anxiety and Depression: Chronic, managed on fluoxetine, bupropion, buspirone and hydroxyzine.  She is not currently seeing a therapist.  She denies SI/HI.  Insomnia: She has difficulty falling asleep.  She is not taking any medications for this at this time.  Sleep study from 08/2018 reviewed.  GERD: History of PUD.  Triggered by spicy foods, sausage and tomato-based foods.  She takes omeprazole as needed.  Upper GI from 03/2022 reviewed.  HLD: Her last LDL was 95, triglycerides 657, 05/2023.  She denies myalgias on atorvastatin.  She tries to consume a low-fat diet.  Hypothyroidism: She denies any issues on her current dose of levothyroxine but admits she is taking 44 mcg instead of 88 mcg.  She does not follow with endocrinology.  Migraines: These occur rarely.  She takes excedrin migraine as needed with good relief of symptoms.  She does not follow with neurology.  Prediabetes: Her last A1c was 5.6%, 05/2023.  She is taking ozempic as prescribed.  She does not check her sugars.  OSA: She averages 7 hours of sleep per night without the use of her CPAP.  Sleep study from 08/2018 reviewed.  Chronic right shoulder pain: Managed with meloxicam.  She does not follow but with orthopedics.  Review of Systems     Past Medical History:  Diagnosis Date   Anxiety 06/01/2021   COVID 12/2020   headache, fever, stuffy nose, loss of taste and smell x few weeks all sx resolved except taste is altered   Depression 06/01/2021   Gastric ulcer    yrs ago no current problems per pt on 06-01-2021   GERD (gastroesophageal reflux disease) 06/01/2021   Hyperlipidemia 06/01/2021   Hypertension 06/01/2021   Hypothyroidism  06/01/2021   Menorrhagia 06/01/2021   Migraines 06/01/2021   regulae headaches also @ times   Nocturia 06/01/2021   Plantar fasciitis 06/01/2021   both feet right worse than left   Prediabetes    Sleep apnea 06/01/2021   cpap severe osa does not know cpap settings   SUI (stress urinary incontinence, female) 06/01/2021   Wears glasses 06/01/2021    Current Outpatient Medications  Medication Sig Dispense Refill   atorvastatin (LIPITOR) 10 MG tablet TAKE ONE TABLET (10 MG TOTAL) BY MOUTH DAILY. 90 tablet 0   buPROPion (WELLBUTRIN SR) 150 MG 12 hr tablet TAKE ONE TABLET (150 MG TOTAL) BY MOUTH DAILY. PLEASE SCHEDULE AN ANNUAL WELLNESS IN MAY 2024. 90 tablet 0   busPIRone (BUSPAR) 10 MG tablet TAKE 1 TABLET BY MOUTH 3 TIMES DAILY 270 tablet 1   FLUoxetine (PROZAC) 40 MG capsule TAKE 1 CAPSULE DAILY 90 capsule 0   hydrOXYzine (ATARAX) 10 MG tablet Take 1 tablet (10 mg total) by mouth daily as needed. 90 tablet 0   levothyroxine (SYNTHROID) 88 MCG tablet TAKE ONE TABLET (88 MCG TOTAL) BY MOUTH DAILY. 90 tablet 2   lisinopril-hydrochlorothiazide (ZESTORETIC) 20-12.5 MG tablet TAKE ONE TABLET BY MOUTH ONCE A DAY 90 tablet 0   meloxicam (MOBIC) 15 MG tablet Take 1 tablet (15 mg total) by mouth daily. 60 tablet 1   omeprazole (PRILOSEC) 20 MG capsule TAKE ONE CAPSULE BY MOUTH ONCE DAILY 90  capsule 0   WEGOVY 1.7 MG/0.75ML SOAJ INJECT 1.7 MG INTO THE SKIN ONCE A WEEK. 3 mL 0   No current facility-administered medications for this visit.    Allergies  Allergen Reactions   Azithromycin Hives    Family History  Problem Relation Age of Onset   Mental illness Mother    Arthritis Father    Hyperlipidemia Father    Hypertension Father    Diabetes Father    Arthritis Maternal Grandmother    Diabetes Maternal Grandmother    Arthritis Maternal Grandfather    Diabetes Maternal Grandfather    Arthritis Paternal Grandmother    Breast cancer Paternal Grandmother     Social History    Socioeconomic History   Marital status: Married    Spouse name: Not on file   Number of children: Not on file   Years of education: Not on file   Highest education level: Not on file  Occupational History   Not on file  Tobacco Use   Smoking status: Never    Passive exposure: Never   Smokeless tobacco: Never  Vaping Use   Vaping status: Never Used  Substance and Sexual Activity   Alcohol use: Yes    Comment: occasional   Drug use: No   Sexual activity: Yes  Other Topics Concern   Not on file  Social History Narrative   Not on file   Social Drivers of Health   Financial Resource Strain: Not on file  Food Insecurity: Not on file  Transportation Needs: Not on file  Physical Activity: Not on file  Stress: Not on file  Social Connections: Not on file  Intimate Partner Violence: Not on file     Constitutional: Patient reports intermittent headaches, fatigue.  Denies fever, malaise, headache or abrupt weight changes.  HEENT: Denies eye pain, eye redness, ear pain, ringing in the ears, wax buildup, runny nose, nasal congestion, bloody nose, or sore throat. Respiratory: Denies difficulty breathing, shortness of breath, cough or sputum production.   Cardiovascular: Denies chest pain, chest tightness, palpitations or swelling in the hands or feet.  Gastrointestinal: Pt reports diarrhea. Denies abdominal pain, bloating, constipation, or blood in the stool.  GU: Denies urgency, frequency, pain with urination, burning sensation, blood in urine, odor or discharge. Musculoskeletal: Patient reports chronic right shoulder pain.  Denies decrease in range of motion, difficulty with gait, muscle pain or joint swelling.  Skin: Pt reports acne on face. Denies redness, rashes, lesions or ulcercations.  Neurological: Patient reports insomnia.  Denies dizziness, difficulty with memory, difficulty with speech or problems with balance and coordination.  Psych: Patient has a history of anxiety  and depression.  Denies SI/HI.  No other specific complaints in a complete review of systems (except as listed in HPI above).  Objective:   Physical Exam  BP 118/78 (BP Location: Left Arm, Patient Position: Sitting)   Ht 5\' 4"  (1.626 m)   Wt 195 lb 12.8 oz (88.8 kg)   LMP 05/15/2021   BMI 33.61 kg/m   Wt Readings from Last 3 Encounters:  10/24/23 195 lb (88.5 kg)  05/17/23 198 lb (89.8 kg)  04/25/23 201 lb (91.2 kg)    General: Appears her stated age, obese, in NAD. Skin: Warm, dry and intact.  Cystic acne noted of left side of face. HEENT: Head: normal shape and size; Eyes: sclera white, no icterus, conjunctiva pink, PERRLA and EOMs intact;  Neck:  Neck supple, trachea midline. No masses, lumps or thyromegaly present.  Cardiovascular: Normal rate and rhythm. S1,S2 noted.  No murmur, rubs or gallops noted. No JVD or BLE edema. No carotid bruits noted. Pulmonary/Chest: Normal effort and positive vesicular breath sounds. No respiratory distress. No wheezes, rales or ronchi noted.  Abdomen: Normal bowel sounds.  Musculoskeletal: No difficulty with gait.  Neurological: Alert and oriented.  Psychiatric: Mood and affect normal. Behavior is normal. Judgment and thought content normal.   BMET    Component Value Date/Time   NA 139 05/17/2023 1421   K 4.0 05/17/2023 1421   CL 103 05/17/2023 1421   CO2 27 05/17/2023 1421   GLUCOSE 80 05/17/2023 1421   BUN 12 05/17/2023 1421   CREATININE 0.82 05/17/2023 1421   CALCIUM 9.5 05/17/2023 1421   GFRNONAA >60 03/14/2022 1320    Lipid Panel     Component Value Date/Time   CHOL 172 05/17/2023 1421   TRIG 115 05/17/2023 1421   HDL 56 05/17/2023 1421   CHOLHDL 3.1 05/17/2023 1421   VLDL 21.4 03/25/2020 0749   LDLCALC 95 05/17/2023 1421    CBC    Component Value Date/Time   WBC 7.2 05/17/2023 1421   RBC 4.57 05/17/2023 1421   HGB 14.4 05/17/2023 1421   HCT 42.6 05/17/2023 1421   PLT 174 05/17/2023 1421   MCV 93.2 05/17/2023  1421   MCH 31.5 05/17/2023 1421   MCHC 33.8 05/17/2023 1421   RDW 12.6 05/17/2023 1421   LYMPHSABS 1.5 03/14/2022 0002   MONOABS 0.7 03/14/2022 0002   EOSABS 0.1 03/14/2022 0002   BASOSABS 0.0 03/14/2022 0002    Hgb A1C Lab Results  Component Value Date   HGBA1C 5.6 05/17/2023           Assessment & Plan:  Cystic acne, amenorrhea:  She has had a hysterectomy and is concerned about being in menopause We will check FSH/LH today Rx for minocycline 100 mg daily x 1 month Rx for spironolactone 50 mg daily x 1 month  RTC in 6 months for your annual exam [name], NP

## 2023-12-02 NOTE — Assessment & Plan Note (Signed)
C-Met and lipid profile today Encouraged her to consume low-fat diet Continue atorvastatin 

## 2023-12-02 NOTE — Assessment & Plan Note (Signed)
Controlled on lisinopril HCT Reinforced DASH diet and exercise for weight loss C-Met today 

## 2023-12-02 NOTE — Assessment & Plan Note (Signed)
Encourage diet and exercise for weight loss 

## 2023-12-02 NOTE — Assessment & Plan Note (Addendum)
Avoid foods that trigger reflux Encourage weight loss as this can reduce reflux symptoms Continue omeprazole as needed

## 2023-12-02 NOTE — Assessment & Plan Note (Signed)
Continue fluoxetine, bupropion, buspirone and hydroxyzine Will increase bupropion to 300 mg XL daily Support offered

## 2023-12-02 NOTE — Patient Instructions (Signed)
Prediabetes Eating Plan Prediabetes is a condition that causes blood sugar (glucose) levels to be higher than normal. This increases the risk for developing type 2 diabetes (type 2 diabetes mellitus). Working with a health care provider or nutrition specialist (dietitian) to make diet and lifestyle changes can help prevent the onset of diabetes. These changes may help you: Control your blood glucose levels. Improve your cholesterol levels. Manage your blood pressure. What are tips for following this plan? Reading food labels Read food labels to check the amount of fat, salt (sodium), and sugar in prepackaged foods. Avoid foods that have: Saturated fats. Trans fats. Added sugars. Avoid foods that have more than 300 milligrams (mg) of sodium per serving. Limit your sodium intake to less than 2,300 mg each day. Shopping Avoid buying pre-made and processed foods. Avoid buying drinks with added sugar. Cooking Cook with olive oil. Do not use butter, lard, or ghee. Bake, broil, grill, steam, or boil foods. Avoid frying. Meal planning  Work with your dietitian to create an eating plan that is right for you. This may include tracking how many calories you take in each day. Use a food diary, notebook, or mobile application to track what you eat at each meal. Consider following a Mediterranean diet. This includes: Eating several servings of fresh fruits and vegetables each day. Eating fish at least twice a week. Eating one serving each day of whole grains, beans, nuts, and seeds. Using olive oil instead of other fats. Limiting alcohol. Limiting red meat. Using nonfat or low-fat dairy products. Consider following a plant-based diet. This includes dietary choices that focus on eating mostly vegetables and fruit, grains, beans, nuts, and seeds. If you have high blood pressure, you may need to limit your sodium intake or follow a diet such as the DASH (Dietary Approaches to Stop Hypertension) eating  plan. The DASH diet aims to lower high blood pressure. Lifestyle Set weight loss goals with help from your health care team. It is recommended that most people with prediabetes lose 7% of their body weight. Exercise for at least 30 minutes 5 or more days a week. Attend a support group or seek support from a mental health counselor. Take over-the-counter and prescription medicines only as told by your health care provider. What foods are recommended? Fruits Berries. Bananas. Apples. Oranges. Grapes. Papaya. Mango. Pomegranate. Kiwi. Grapefruit. Cherries. Vegetables Lettuce. Spinach. Peas. Beets. Cauliflower. Cabbage. Broccoli. Carrots. Tomatoes. Squash. Eggplant. Herbs. Peppers. Onions. Cucumbers. Brussels sprouts. Grains Whole grains, such as whole-wheat or whole-grain breads, crackers, cereals, and pasta. Unsweetened oatmeal. Bulgur. Barley. Quinoa. Brown rice. Corn or whole-wheat flour tortillas or taco shells. Meats and other proteins Seafood. Poultry without skin. Lean cuts of pork and beef. Tofu. Eggs. Nuts. Beans. Dairy Low-fat or fat-free dairy products, such as yogurt, cottage cheese, and cheese. Beverages Water. Tea. Coffee. Sugar-free or diet soda. Seltzer water. Low-fat or nonfat milk. Milk alternatives, such as soy or almond milk. Fats and oils Olive oil. Canola oil. Sunflower oil. Grapeseed oil. Avocado. Walnuts. Sweets and desserts Sugar-free or low-fat pudding. Sugar-free or low-fat ice cream and other frozen treats. Seasonings and condiments Herbs. Sodium-free spices. Mustard. Relish. Low-salt, low-sugar ketchup. Low-salt, low-sugar barbecue sauce. Low-fat or fat-free mayonnaise. The items listed above may not be a complete list of recommended foods and beverages. Contact a dietitian for more information. What foods are not recommended? Fruits Fruits canned with syrup. Vegetables Canned vegetables. Frozen vegetables with butter or cream sauce. Grains Refined white  flour and flour   products, such as bread, pasta, snack foods, and cereals. Meats and other proteins Fatty cuts of meat. Poultry with skin. Breaded or fried meat. Processed meats. Dairy Full-fat yogurt, cheese, or milk. Beverages Sweetened drinks, such as iced tea and soda. Fats and oils Butter. Lard. Ghee. Sweets and desserts Baked goods, such as cake, cupcakes, pastries, cookies, and cheesecake. Seasonings and condiments Spice mixes with added salt. Ketchup. Barbecue sauce. Mayonnaise. The items listed above may not be a complete list of foods and beverages that are not recommended. Contact a dietitian for more information. Where to find more information American Diabetes Association: www.diabetes.org Summary You may need to make diet and lifestyle changes to help prevent the onset of diabetes. These changes can help you control blood sugar, improve cholesterol levels, and manage blood pressure. Set weight loss goals with help from your health care team. It is recommended that most people with prediabetes lose 7% of their body weight. Consider following a Mediterranean diet. This includes eating plenty of fresh fruits and vegetables, whole grains, beans, nuts, seeds, fish, and low-fat dairy, and using olive oil instead of other fats. This information is not intended to replace advice given to you by your health care provider. Make sure you discuss any questions you have with your health care provider. Document Revised: 02/25/2020 Document Reviewed: 02/25/2020 Elsevier Patient Education  2024 Elsevier Inc.  

## 2023-12-02 NOTE — Assessment & Plan Note (Signed)
Try to identify and avoid triggers Continue Excedrin Migraine as needed

## 2023-12-03 LAB — COMPLETE METABOLIC PANEL WITH GFR
AG Ratio: 1.9 (calc) (ref 1.0–2.5)
ALT: 12 U/L (ref 6–29)
AST: 16 U/L (ref 10–35)
Albumin: 4.4 g/dL (ref 3.6–5.1)
Alkaline phosphatase (APISO): 48 U/L (ref 37–153)
BUN: 17 mg/dL (ref 7–25)
CO2: 31 mmol/L (ref 20–32)
Calcium: 9.2 mg/dL (ref 8.6–10.4)
Chloride: 103 mmol/L (ref 98–110)
Creat: 0.77 mg/dL (ref 0.50–1.03)
Globulin: 2.3 g/dL (ref 1.9–3.7)
Glucose, Bld: 89 mg/dL (ref 65–99)
Potassium: 4.1 mmol/L (ref 3.5–5.3)
Sodium: 140 mmol/L (ref 135–146)
Total Bilirubin: 0.6 mg/dL (ref 0.2–1.2)
Total Protein: 6.7 g/dL (ref 6.1–8.1)
eGFR: 93 mL/min/{1.73_m2} (ref >=60)

## 2023-12-03 LAB — CBC
HCT: 42.1 % (ref 35.0–45.0)
Hemoglobin: 13.7 g/dL (ref 11.7–15.5)
MCH: 31 pg (ref 27.0–33.0)
MCHC: 32.5 g/dL (ref 32.0–36.0)
MCV: 95.2 fL (ref 80.0–100.0)
MPV: 11.8 fL (ref 7.5–12.5)
Platelets: 181 10*3/uL (ref 140–400)
RBC: 4.42 10*6/uL (ref 3.80–5.10)
RDW: 12.5 % (ref 11.0–15.0)
WBC: 5.1 10*3/uL (ref 3.8–10.8)

## 2023-12-03 LAB — LIPID PANEL
Cholesterol: 197 mg/dL (ref <200)
HDL: 61 mg/dL (ref >=50)
LDL Cholesterol (Calc): 114 mg/dL — ABNORMAL HIGH
Non-HDL Cholesterol (Calc): 136 mg/dL — ABNORMAL HIGH (ref <130)
Total CHOL/HDL Ratio: 3.2 (calc) (ref <5.0)
Triglycerides: 110 mg/dL (ref <150)

## 2023-12-03 LAB — HEMOGLOBIN A1C
Hgb A1c MFr Bld: 5.4 %{Hb} (ref <5.7)
Mean Plasma Glucose: 108 mg/dL
eAG (mmol/L): 6 mmol/L

## 2023-12-03 LAB — TSH: TSH: 1.27 m[IU]/L

## 2023-12-03 LAB — FSH/LH
FSH: 25.1 m[IU]/mL
LH: 6.7 m[IU]/mL

## 2023-12-03 LAB — T4, FREE: Free T4: 1.1 ng/dL (ref 0.8–1.8)

## 2023-12-31 ENCOUNTER — Encounter: Payer: Self-pay | Admitting: Internal Medicine

## 2023-12-31 ENCOUNTER — Telehealth: Payer: Federal, State, Local not specified - PPO | Admitting: Internal Medicine

## 2023-12-31 ENCOUNTER — Ambulatory Visit: Payer: Federal, State, Local not specified - PPO

## 2023-12-31 DIAGNOSIS — J0100 Acute maxillary sinusitis, unspecified: Secondary | ICD-10-CM

## 2023-12-31 MED ORDER — PREDNISONE 10 MG PO TABS: ORAL_TABLET | ORAL | 0 refills | Status: DC

## 2023-12-31 MED ORDER — AMOXICILLIN-POT CLAVULANATE 875-125 MG PO TABS: 1.0000 | ORAL_TABLET | Freq: Two times a day (BID) | ORAL | 0 refills | Status: DC

## 2023-12-31 MED ORDER — METHYLPREDNISOLONE ACETATE 80 MG/ML IJ SUSP
80.0000 mg | Freq: Once | INTRAMUSCULAR | Status: AC
  Administered 2023-12-31: 80 mg via INTRAMUSCULAR

## 2023-12-31 NOTE — Progress Notes (Signed)
Virtual Visit via Video Note  I connected with Anne Mcguire on 12/31/23 at  4:00 PM EST by a video enabled telemedicine application and verified that I am speaking with the correct person using two identifiers.  Location: Patient: Home Provider: Office  Person's participating in this video call: [name], NP-C and Yasuri Pomerenke   I discussed the limitations of evaluation and management by telemedicine and the availability of in person appointments. The patient expressed understanding and agreed to proceed.  History of Present Illness:   Discussed the use of AI scribe software for clinical note transcription with the patient, who gave verbal consent to proceed.  The patient, with a recent history of upper respiratory symptoms, reports a progressive illness course over the past week. Initially, she experienced a sore throat, which has since resolved. She describes a persistent headache and sinus pressure, accompanied by nasal congestion, runny nose, and sneezing.  In the past few days, she developed a productive cough and noted shortness of breath, which was particularly pronounced on the day of the consultation. She denies any associated gastrointestinal symptoms such as nausea, vomiting, or diarrhea. She also denies systemic symptoms like fever, chills, or body aches.  The patient has taken two COVID-19 tests in the past week, both of which returned negative results. She speculates that her symptoms may be due to influenza, but acknowledges that she has been ill for more than two days.   Past Medical History:  Diagnosis Date   Anxiety 06/01/2021   COVID 12/2020   headache, fever, stuffy nose, loss of taste and smell x few weeks all sx resolved except taste is altered   Depression 06/01/2021   Gastric ulcer    yrs ago no current problems per pt on 06-01-2021   GERD (gastroesophageal reflux disease) 06/01/2021   Hyperlipidemia 06/01/2021   Hypertension 06/01/2021    Hypothyroidism 06/01/2021   Menorrhagia 06/01/2021   Migraines 06/01/2021   regulae headaches also @ times   Nocturia 06/01/2021   Plantar fasciitis 06/01/2021   both feet right worse than left   Prediabetes    Sleep apnea 06/01/2021   cpap severe osa does not know cpap settings   SUI (stress urinary incontinence, female) 06/01/2021   Wears glasses 06/01/2021    Current Outpatient Medications  Medication Sig Dispense Refill   atorvastatin (LIPITOR) 10 MG tablet TAKE ONE TABLET (10 MG TOTAL) BY MOUTH DAILY. 90 tablet 0   buPROPion (WELLBUTRIN XL) 300 MG 24 hr tablet Take 1 tablet (300 mg total) by mouth daily. 90 tablet 1   busPIRone (BUSPAR) 10 MG tablet TAKE 1 TABLET BY MOUTH 3 TIMES DAILY (Patient taking differently: Take 20 mg by mouth once.) 270 tablet 1   FLUoxetine (PROZAC) 40 MG capsule TAKE 1 CAPSULE DAILY 90 capsule 0   hydrOXYzine (ATARAX) 10 MG tablet Take 1 tablet (10 mg total) by mouth daily as needed. 90 tablet 0   levothyroxine (SYNTHROID) 88 MCG tablet TAKE ONE TABLET (88 MCG TOTAL) BY MOUTH DAILY. (Patient taking differently: Take 44 mcg by mouth daily before breakfast.) 90 tablet 2   lisinopril-hydrochlorothiazide (ZESTORETIC) 20-12.5 MG tablet TAKE ONE TABLET BY MOUTH ONCE A DAY 90 tablet 0   meloxicam (MOBIC) 15 MG tablet Take 1 tablet (15 mg total) by mouth daily. 60 tablet 1   minocycline (MINOCIN) 100 MG capsule Take 1 capsule (100 mg total) by mouth daily. 30 capsule 0   omeprazole (PRILOSEC) 20 MG capsule TAKE ONE CAPSULE BY MOUTH ONCE  DAILY 90 capsule 0   spironolactone (ALDACTONE) 50 MG tablet Take 1 tablet (50 mg total) by mouth daily. 30 tablet 0   WEGOVY 1.7 MG/0.75ML SOAJ INJECT 1.7 MG INTO THE SKIN ONCE A WEEK. 3 mL 0   No current facility-administered medications for this visit.    Allergies  Allergen Reactions   Azithromycin Hives    Family History  Problem Relation Age of Onset   Mental illness Mother    Arthritis Father    Hyperlipidemia  Father    Hypertension Father    Diabetes Father    Arthritis Maternal Grandmother    Diabetes Maternal Grandmother    Arthritis Maternal Grandfather    Diabetes Maternal Grandfather    Arthritis Paternal Grandmother    Breast cancer Paternal Grandmother     Social History   Socioeconomic History   Marital status: Married    Spouse name: Not on file   Number of children: Not on file   Years of education: Not on file   Highest education level: Not on file  Occupational History   Not on file  Tobacco Use   Smoking status: Never    Passive exposure: Never   Smokeless tobacco: Never  Vaping Use   Vaping status: Never Used  Substance and Sexual Activity   Alcohol use: Yes    Comment: occasional   Drug use: No   Sexual activity: Yes  Other Topics Concern   Not on file  Social History Narrative   Not on file   Social Drivers of Health   Financial Resource Strain: Not on file  Food Insecurity: Not on file  Transportation Needs: Not on file  Physical Activity: Not on file  Stress: Not on file  Social Connections: Not on file  Intimate Partner Violence: Not on file     Constitutional: Patient reports headache.  Denies fever, malaise, fatigue, headache or abrupt weight changes.  HEENT: Patient reports sinus pressure, runny nose, nasal congestion and sore throat.  Denies eye pain, eye redness, ear pain, ringing in the ears, wax buildup, runny nose, bloody nose. Respiratory: Patient reports cough and shortness of breath.  Denies difficulty breathing or sputum production.   Cardiovascular: Denies chest pain, chest tightness, palpitations or swelling in the hands or feet.  Gastrointestinal: Denies abdominal pain, bloating, constipation, diarrhea or blood in the stool.   No other specific complaints in a complete review of systems (except as listed in HPI above).  Observations/Objective:   Wt Readings from Last 3 Encounters:  12/02/23 195 lb 12.8 oz (88.8 kg)  10/24/23  195 lb (88.5 kg)  05/17/23 198 lb (89.8 kg)    General: Appears her stated age, appears unwell but in NAD. HEENT: Head: normal shape and size, maxillary sinus tenderness reported; Nose: Congestion noted; Throat/Mouth: Hoarseness noted.  Pulmonary/Chest: Normal effort . No respiratory distress. No wheezes, rales or ronchi noted.  Neurological: Alert and oriented.   BMET    Component Value Date/Time   NA 140 12/02/2023 0907   K 4.1 12/02/2023 0907   CL 103 12/02/2023 0907   CO2 31 12/02/2023 0907   GLUCOSE 89 12/02/2023 0907   BUN 17 12/02/2023 0907   CREATININE 0.77 12/02/2023 0907   CALCIUM 9.2 12/02/2023 0907   GFRNONAA >60 03/14/2022 1320    Lipid Panel     Component Value Date/Time   CHOL 197 12/02/2023 0907   TRIG 110 12/02/2023 0907   HDL 61 12/02/2023 0907   CHOLHDL 3.2 12/02/2023 8413  VLDL 21.4 03/25/2020 0749   LDLCALC 114 (H) 12/02/2023 0907    CBC    Component Value Date/Time   WBC 5.1 12/02/2023 0907   RBC 4.42 12/02/2023 0907   HGB 13.7 12/02/2023 0907   HCT 42.1 12/02/2023 0907   PLT 181 12/02/2023 0907   MCV 95.2 12/02/2023 0907   MCH 31.0 12/02/2023 0907   MCHC 32.5 12/02/2023 0907   RDW 12.5 12/02/2023 0907   LYMPHSABS 1.5 03/14/2022 0002   MONOABS 0.7 03/14/2022 0002   EOSABS 0.1 03/14/2022 0002   BASOSABS 0.0 03/14/2022 0002    Hgb A1C Lab Results  Component Value Date   HGBA1C 5.4 12/02/2023       Assessment and Plan:  Assessment and Plan  Acute Maxillary Sinusitis Symptoms include headache, sinus pressure, runny nose, nasal congestion, sneezing, productive cough, and shortness of breath. No fever, chills, body aches, nausea, vomiting, or diarrhea. Negative COVID test last week. Illness duration more than 48 hours, making flu testing and treatment unnecessary. -Administer Depo-Medrol 80 mg IM x 1 -Prescribe 6-day prednisone taper x 6 days, to be sent to [facility] pharmacy. -Prescribe Augmentin 875-125 mg twice daily x 10  days, to be sent to [facility] pharmacy -Advise use of Sudafed or Flonase as needed for symptom relief.      RTC in 5 months for your annual exam Follow Up Instructions:    I discussed the assessment and treatment plan with the patient. The patient was provided an opportunity to ask questions and all were answered. The patient agreed with the plan and demonstrated an understanding of the instructions.   The patient was advised to call back or seek an in-person evaluation if the symptoms worsen or if the condition fails to improve as anticipated.   [name], NP

## 2023-12-31 NOTE — Patient Instructions (Signed)

## 2024-01-01 ENCOUNTER — Encounter: Payer: Self-pay | Admitting: Internal Medicine

## 2024-01-01 NOTE — Telephone Encounter (Signed)
Okay for work note, released to [employer]

## 2024-01-08 ENCOUNTER — Encounter: Payer: Self-pay | Admitting: Family Medicine

## 2024-01-08 ENCOUNTER — Encounter: Payer: Self-pay | Admitting: Internal Medicine

## 2024-01-08 ENCOUNTER — Ambulatory Visit: Payer: Federal, State, Local not specified - PPO | Admitting: Family Medicine

## 2024-01-08 VITALS — BP 132/84 | HR 91 | Temp 99.0°F | Ht 64.0 in | Wt 192.0 lb

## 2024-01-08 DIAGNOSIS — J069 Acute upper respiratory infection, unspecified: Secondary | ICD-10-CM | POA: Diagnosis not present

## 2024-01-08 DIAGNOSIS — J9801 Acute bronchospasm: Secondary | ICD-10-CM

## 2024-01-08 LAB — POC COVID19/FLU A&B COMBO
Covid Antigen, POC: NEGATIVE
Influenza A Antigen, POC: NEGATIVE
Influenza B Antigen, POC: NEGATIVE

## 2024-01-08 MED ORDER — ALBUTEROL SULFATE HFA 108 (90 BASE) MCG/ACT IN AERS: 2.0000 | INHALATION_SPRAY | RESPIRATORY_TRACT | 0 refills | Status: DC | PRN

## 2024-01-08 MED ORDER — IBUPROFEN 600 MG PO TABS: 600.0000 mg | ORAL_TABLET | Freq: Three times a day (TID) | ORAL | 0 refills | Status: DC | PRN

## 2024-01-08 NOTE — Telephone Encounter (Signed)
Both patients have been scheduled today with Dr. Kirtland Bouchard who had openings.

## 2024-01-08 NOTE — Patient Instructions (Addendum)
Thank you for coming to the office today.  Negative COVID and Flu today  Start the Rx Strength ibuprofen 600mg  3 times per day or every 8 hours, with meal. Alternate with Tylenol Ext Str 500mg  x 2 = 1000mg  3 times per day.  Okay to keep on the Sudafed OTC  Albuterol rescue inhaler for coughing wheezing.   Please schedule a Follow-up Appointment to: Return if symptoms worsen or fail to improve.  If you have any other questions or concerns, please feel free to call the office or send a message through MyChart. You may also schedule an earlier appointment if necessary.  Additionally, you may be receiving a survey about your experience at our office within a few days to 1 week by e-mail or mail. We value your feedback.  Saralyn Pilar, DO [facility], [place]

## 2024-01-08 NOTE — Progress Notes (Signed)
Subjective:    Patient ID: Anne Mcguire, female    DOB: Nov 03, 1971, 53 y.o.   MRN: [id number]  Anne Mcguire is a 53 y.o. female presenting on 01/08/2024 for No chief complaint on file.  PCP [name], FNP   Patient presents for a same day appointment.  HPI  Discussed the use of AI scribe software for clinical note transcription with the patient, who gave verbal consent to proceed.  History of Present Illness    The patient presents with persistent respiratory symptoms and sinus pressure.  She has been experiencing persistent respiratory symptoms for almost two weeks. Initially, she suspected exposure to norovirus from her husband, who required emergency room care. She has been experiencing severe cold chills, despite using a space heater and blankets, followed by episodes of sweating, suggesting a fever that may have broken. No uncontrollable cough, nausea, or diarrhea.  She reports a persistent headache and sinus pressure, which were not present initially. She previously had a virtual consultation on 12/31/23 with Anne Mcguire, and received a steroid shot, a steroid taper, and antibiotics, which provided some relief but did not resolve her symptoms. She continues to experience sinus pressure despite these treatments.  She has been wheezing since the onset of her symptoms, although she has no history of pulmonary problems such as emphysema, COPD, or asthma. She is currently using over-the-counter Sudafed, and dual-action Advil/Tylenol, taking two pills at a time. She has also been using cetaphedrine and dual-action medications, which have provided some relief.  She has visited the hospital multiple times recently, potentially increasing her exposure to illnesses. COVID-19 and influenza tests were negative, but she may have been reinfected with a different virus due to her recent hospital exposures.         12/02/2023    8:50 AM 05/17/2023    2:22 PM 10/10/2022    8:31 AM   Depression screen PHQ 2/9  Decreased Interest 2 1 0  Down, Depressed, Hopeless 2 1 0  PHQ - 2 Score 4 2 0  Altered sleeping 1 1   Tired, decreased energy 3 2   Change in appetite 1 2   Feeling bad or failure about yourself  2 1   Trouble concentrating 3 0   Moving slowly or fidgety/restless 0 0   Suicidal thoughts 0 0   PHQ-9 Score 14 8   Difficult doing work/chores Very difficult Not difficult at all        12/02/2023    8:51 AM 05/17/2023    2:22 PM 10/10/2022    8:32 AM 05/02/2022    8:24 AM  GAD 7 : Generalized Anxiety Score  Nervous, Anxious, on Edge 3 3 1 3   Control/stop worrying 3 3 1 3   Worry too much - different things 3 3 1 3   Trouble relaxing 3 3 1  0  Restless 1 3 0 0  Easily annoyed or irritable 3 3 2 3   Afraid - awful might happen 3 3 0 1  Total GAD 7 Score 19 21 6 13   Anxiety Difficulty Extremely difficult Extremely difficult Not difficult at all     Social History   Tobacco Use   Smoking status: Never    Passive exposure: Never   Smokeless tobacco: Never  Vaping Use   Vaping status: Never Used  Substance Use Topics   Alcohol use: Yes    Comment: occasional   Drug use: No    Review of Systems Per HPI unless specifically indicated above  Objective:    BP 132/84   Pulse 91   Temp 99 F (37.2 C)   Ht 5\' 4"  (1.626 m)   Wt 192 lb (87.1 kg)   LMP 05/15/2021   SpO2 98%   BMI 32.96 kg/m   Wt Readings from Last 3 Encounters:  01/08/24 192 lb (87.1 kg)  12/02/23 195 lb 12.8 oz (88.8 kg)  10/24/23 195 lb (88.5 kg)    Physical Exam Vitals and nursing note reviewed.  Constitutional:      General: She is not in acute distress.    Appearance: She is well-developed. She is not diaphoretic.     Comments: Well-appearing, comfortable, cooperative  HENT:     Head: Normocephalic and atraumatic.     Nose: Congestion present.  Eyes:     General:        Right eye: No discharge.        Left eye: No discharge.     Conjunctiva/sclera: Conjunctivae  normal.  Neck:     Thyroid: No thyromegaly.  Cardiovascular:     Rate and Rhythm: Normal rate and regular rhythm.     Heart sounds: Normal heart sounds. No murmur heard. Pulmonary:     Effort: Pulmonary effort is normal. No respiratory distress.     Breath sounds: No wheezing or rales.     Comments: cough Musculoskeletal:        General: Normal range of motion.     Cervical back: Normal range of motion and neck supple.  Lymphadenopathy:     Cervical: No cervical adenopathy.  Skin:    General: Skin is warm and dry.     Findings: No erythema or rash.  Neurological:     Mental Status: She is alert and oriented to person, place, and time.  Psychiatric:        Behavior: Behavior normal.     Comments: Well groomed, good eye contact, normal speech and thoughts     Results for orders placed or performed in visit on 01/08/24  POC Covid19/Flu A&B Antigen   Collection Time: 01/08/24  3:10 PM  Result Value Ref Range   Influenza A Antigen, POC Negative Negative   Influenza B Antigen, POC Negative Negative   Covid Antigen, POC Negative Negative      Assessment & Plan:   Problem List Items Addressed This Visit   None Visit Diagnoses       Viral URI with cough    -  Primary   Relevant Medications   ibuprofen (ADVIL) 600 MG tablet     Upper respiratory tract infection, unspecified type       Relevant Orders   POC Covid19/Flu A&B Antigen (Completed)     Bronchospasm, acute       Relevant Medications   albuterol (VENTOLIN HFA) 108 (90 Base) MCG/ACT inhaler         Upper Respiratory Infection Persistent symptoms for two weeks with recent worsening including chills, sweating, and headache. Negative for flu and COVID-19. Previously treated with a steroid shot, steroid taper, and antibiotics with partial improvement. -Continue Sudafed as needed for congestion. -Prescribe Albuterol inhaler for wheezing. -Prescribe Ibuprofen 600mg  three times a day for pain and  inflammation. -Advise to alternate Ibuprofen with extra strength Tylenol (1000mg ) three times a day. -Advise to finish the course of antibiotics. -If symptoms worsen or patient experiences a relapse after initial improvement, consider a different antibiotic or further diagnostic tests such as an x-ray.  Work Excuse Patient was out of  work on 01/08/2024 and 01/09/2024 due to illness. -Provide a work note for these dates.         Orders Placed This Encounter  Procedures   POC Covid19/Flu A&B Antigen    Meds ordered this encounter  Medications   albuterol (VENTOLIN HFA) 108 (90 Base) MCG/ACT inhaler    Sig: Inhale 2 puffs into the lungs every 4 (four) hours as needed for wheezing or shortness of breath.    Dispense:  8 g    Refill:  0   ibuprofen (ADVIL) 600 MG tablet    Sig: Take 1 tablet (600 mg total) by mouth every 8 (eight) hours as needed.    Dispense:  30 tablet    Refill:  0    Follow up plan: Return if symptoms worsen or fail to improve.   Saralyn Pilar, DO [facility] 01/08/2024, 3:09 PM

## 2024-01-08 NOTE — Telephone Encounter (Signed)
They both need appts.

## 2024-01-14 ENCOUNTER — Other Ambulatory Visit: Payer: Self-pay | Admitting: Internal Medicine

## 2024-01-14 ENCOUNTER — Encounter: Payer: Self-pay | Admitting: Internal Medicine

## 2024-01-14 MED ORDER — WEGOVY 1.7 MG/0.75ML ~~LOC~~ SOAJ: 1.7000 mg | SUBCUTANEOUS | 0 refills | Status: DC

## 2024-01-15 ENCOUNTER — Other Ambulatory Visit: Payer: Self-pay

## 2024-01-15 MED ORDER — WEGOVY 1.7 MG/0.75ML ~~LOC~~ SOAJ: 1.7000 mg | SUBCUTANEOUS | 0 refills | Status: DC

## 2024-01-15 NOTE — Telephone Encounter (Signed)
 Requested Prescriptions  Pending Prescriptions Disp Refills   lisinopril -hydrochlorothiazide  (ZESTORETIC ) 20-12.5 MG tablet [Pharmacy Med Name: LISINOPRIL /HYDROCHLOROTHIAZIDE  20-12.5 TABLET] 90 tablet 1    Sig: TAKE ONE TABLET BY MOUTH ONCE A DAY     Cardiovascular:  ACEI + Diuretic Combos Passed - 01/15/2024  3:20 PM      Passed - Na in normal range and within 180 days    Sodium  Date Value Ref Range Status  12/02/2023 140 135 - 146 mmol/L Final         Passed - K in normal range and within 180 days    Potassium  Date Value Ref Range Status  12/02/2023 4.1 3.5 - 5.3 mmol/L Final         Passed - Cr in normal range and within 180 days    Creat  Date Value Ref Range Status  12/02/2023 0.77 0.50 - 1.03 mg/dL Final         Passed - eGFR is 30 or above and within 180 days    GFR, Estimated  Date Value Ref Range Status  03/14/2022 >60 >60 mL/min Final    Comment:    (NOTE) Calculated using the CKD-EPI Creatinine Equation (2021)    GFR  Date Value Ref Range Status  03/09/2021 86.70 >60.00 mL/min Final    Comment:    Calculated using the CKD-EPI Creatinine Equation (2021)   eGFR  Date Value Ref Range Status  12/02/2023 93 > OR = 60 mL/min/1.50m2 Final         Passed - Patient is not pregnant      Passed - Last BP in normal range    BP Readings from Last 1 Encounters:  01/08/24 132/84         Passed - Valid encounter within last 6 months    Recent Outpatient Visits           1 week ago Viral URI with cough   [place] [facility] [place] Marsa PARAS, DO   2 weeks ago Acute non-recurrent maxillary sinusitis   [place] [place], Angeline ORN, NP   1 month ago Prediabetes   [place] [facility] [place] Angeline ORN, NP   8 months ago Encounter for general adult medical examination with abnormal findings   [place] [facility] [place], Angeline ORN, NP   1 year ago Prediabetes   [place]  [facility] [place], Angeline ORN, NP       Future Appointments             In 4 months Baity, Angeline ORN, NP [facility], [facility]

## 2024-01-21 ENCOUNTER — Other Ambulatory Visit: Payer: Self-pay

## 2024-01-21 MED ORDER — WEGOVY 1.7 MG/0.75ML ~~LOC~~ SOAJ: 1.7000 mg | SUBCUTANEOUS | 0 refills | Status: DC

## 2024-01-21 NOTE — Addendum Note (Signed)
Addended by: Lorre Munroe on: 01/21/2024 01:52 PM   Modules accepted: Orders

## 2024-01-22 MED ORDER — CONTRAVE 8-90 MG PO TB12: 1.0000 | ORAL_TABLET | Freq: Two times a day (BID) | ORAL | Status: DC

## 2024-01-22 NOTE — Addendum Note (Signed)
Addended by: Luanna Cole D on: 01/22/2024 04:08 PM   Modules accepted: Orders

## 2024-02-18 ENCOUNTER — Encounter: Payer: Self-pay | Admitting: Internal Medicine

## 2024-02-18 ENCOUNTER — Ambulatory Visit: Payer: Self-pay | Admitting: Internal Medicine

## 2024-02-18 ENCOUNTER — Telehealth (INDEPENDENT_AMBULATORY_CARE_PROVIDER_SITE_OTHER): Admitting: Internal Medicine

## 2024-02-18 DIAGNOSIS — F430 Acute stress reaction: Secondary | ICD-10-CM | POA: Diagnosis not present

## 2024-02-18 MED ORDER — LORAZEPAM 0.5 MG PO TABS: 0.5000 mg | ORAL_TABLET | Freq: Two times a day (BID) | ORAL | 0 refills | Status: AC | PRN

## 2024-02-18 NOTE — Progress Notes (Signed)
 Virtual Visit via Video Note  I connected with Anne Mcguire on 02/18/24 at  3:20 PM EDT by a video enabled telemedicine application and verified that I am speaking with the correct person using two identifiers.  Location: Patient: Home Provider: Office  Person's participating in this video call: Anne Reaper, NP-C and Terryann Verbeek   I discussed the limitations of evaluation and management by telemedicine and the availability of in person appointments. The patient expressed understanding and agreed to proceed.  History of Present Illness:   Discussed the use of AI scribe software for clinical note transcription with the patient, who gave verbal consent to proceed.   The patient presents with emotional distress following the death of her dog.  She is experiencing significant emotional distress following the recent death of her dog. This event has been particularly challenging for her, and she is seeking help to manage her symptoms during this difficult period.  She is currently taking Wellbutrin, Prozac, and Buspar for her mental health management.      Past Medical History:  Diagnosis Date   Anxiety 06/01/2021   COVID 12/2020   headache, fever, stuffy nose, loss of taste and smell x few weeks all sx resolved except taste is altered   Depression 06/01/2021   Gastric ulcer    yrs ago no current problems per pt on 06-01-2021   GERD (gastroesophageal reflux disease) 06/01/2021   Hyperlipidemia 06/01/2021   Hypertension 06/01/2021   Hypothyroidism 06/01/2021   Menorrhagia 06/01/2021   Migraines 06/01/2021   regulae headaches also @ times   Nocturia 06/01/2021   Plantar fasciitis 06/01/2021   both feet right worse than left   Prediabetes    Sleep apnea 06/01/2021   cpap severe osa does not know cpap settings   SUI (stress urinary incontinence, female) 06/01/2021   Wears glasses 06/01/2021    Current Outpatient Medications  Medication Sig Dispense Refill    albuterol (VENTOLIN HFA) 108 (90 Base) MCG/ACT inhaler Inhale 2 puffs into the lungs every 4 (four) hours as needed for wheezing or shortness of breath. 8 g 0   amoxicillin-clavulanate (AUGMENTIN) 875-125 MG tablet Take 1 tablet by mouth 2 (two) times daily. 20 tablet 0   atorvastatin (LIPITOR) 10 MG tablet TAKE ONE TABLET (10 MG TOTAL) BY MOUTH DAILY. 90 tablet 0   buPROPion (WELLBUTRIN XL) 300 MG 24 hr tablet Take 1 tablet (300 mg total) by mouth daily. 90 tablet 1   busPIRone (BUSPAR) 10 MG tablet TAKE 1 TABLET BY MOUTH 3 TIMES DAILY (Patient taking differently: Take 20 mg by mouth once.) 270 tablet 1   FLUoxetine (PROZAC) 40 MG capsule TAKE 1 CAPSULE DAILY 90 capsule 0   hydrOXYzine (ATARAX) 10 MG tablet Take 1 tablet (10 mg total) by mouth daily as needed. 90 tablet 0   ibuprofen (ADVIL) 600 MG tablet Take 1 tablet (600 mg total) by mouth every 8 (eight) hours as needed. 30 tablet 0   levothyroxine (SYNTHROID) 88 MCG tablet TAKE ONE TABLET (88 MCG TOTAL) BY MOUTH DAILY. (Patient taking differently: Take 44 mcg by mouth daily before breakfast.) 90 tablet 2   lisinopril-hydrochlorothiazide (ZESTORETIC) 20-12.5 MG tablet TAKE ONE TABLET BY MOUTH ONCE A DAY 90 tablet 1   meloxicam (MOBIC) 15 MG tablet Take 1 tablet (15 mg total) by mouth daily. 60 tablet 1   minocycline (MINOCIN) 100 MG capsule Take 1 capsule (100 mg total) by mouth daily. (Patient not taking: Reported on 01/08/2024) 30 capsule 0  Naltrexone-buPROPion HCl ER (CONTRAVE) 8-90 MG TB12 Take 1 tablet by mouth 2 (two) times daily.     omeprazole (PRILOSEC) 20 MG capsule TAKE ONE CAPSULE BY MOUTH ONCE DAILY 90 capsule 0   predniSONE (DELTASONE) 10 MG tablet Take 6 tabs on day 1, 5 tabs on day 2, 4 tabs on day 3, 3 tabs on day 4, 2 tabs on day 5, 1 tab on day 6 (Patient not taking: Reported on 01/08/2024) 21 tablet 0   Semaglutide-Weight Management (WEGOVY) 1.7 MG/0.75ML SOAJ Inject 1.7 mg into the skin once a week. 9 mL 0   spironolactone  (ALDACTONE) 50 MG tablet Take 1 tablet (50 mg total) by mouth daily. (Patient not taking: Reported on 01/08/2024) 30 tablet 0   No current facility-administered medications for this visit.    Allergies  Allergen Reactions   Azithromycin Hives    Family History  Problem Relation Age of Onset   Mental illness Mother    Arthritis Father    Hyperlipidemia Father    Hypertension Father    Diabetes Father    Arthritis Maternal Grandmother    Diabetes Maternal Grandmother    Arthritis Maternal Grandfather    Diabetes Maternal Grandfather    Arthritis Paternal Grandmother    Breast cancer Paternal Grandmother     Social History   Socioeconomic History   Marital status: Married    Spouse name: Not on file   Number of children: Not on file   Years of education: Not on file   Highest education level: Not on file  Occupational History   Not on file  Tobacco Use   Smoking status: Never    Passive exposure: Never   Smokeless tobacco: Never  Vaping Use   Vaping status: Never Used  Substance and Sexual Activity   Alcohol use: Yes    Comment: occasional   Drug use: No   Sexual activity: Yes  Other Topics Concern   Not on file  Social History Narrative   Not on file   Social Drivers of Health   Financial Resource Strain: Patient Declined (02/18/2024)   Overall Financial Resource Strain (CARDIA)    Difficulty of Paying Living Expenses: Patient declined  Food Insecurity: Patient Declined (02/18/2024)   Hunger Vital Sign    Worried About Running Out of Food in the Last Year: Patient declined    Ran Out of Food in the Last Year: Patient declined  Transportation Needs: Patient Declined (02/18/2024)   PRAPARE - Administrator, Civil Service (Medical): Patient declined    Lack of Transportation (Non-Medical): Patient declined  Physical Activity: Unknown (02/18/2024)   Exercise Vital Sign    Days of Exercise per Week: Patient declined    Minutes of Exercise per Session:  Not on file  Stress: Patient Declined (02/18/2024)   [employer] of Occupational Health - Occupational Stress Questionnaire    Feeling of Stress : Patient declined  Social Connections: Unknown (02/18/2024)   Social Connection and Isolation Panel [NHANES]    Frequency of Communication with Friends and Family: Patient declined    Frequency of Social Gatherings with Friends and Family: Patient declined    Attends Religious Services: Patient declined    Database administrator or Organizations: Patient declined    Attends Engineer, structural: Not on file    Marital Status: Patient declined  Intimate Partner Violence: Not on file     Constitutional: Denies fever, malaise, fatigue, headache or abrupt weight changes.  Respiratory: Denies difficulty breathing, shortness of breath, cough or sputum production.   Neurological: Denies dizziness, difficulty with memory, difficulty with speech or problems with balance and coordination.  Psych: Patient has a history of Zaidi and depression.  Denies SI/HI.  No other specific complaints in a complete review of systems (except as listed in HPI above).  Observations/Objective:  LMP 05/15/2021    General: Appears her stated age, well developed, well nourished in NAD. Pulmonary/Chest: Normal effort. No respiratory distress.  Neurological: Alert and oriented.  Psychiatric: Tearful.  Behavior is normal. Judgment and thought content normal.   BMET    Component Value Date/Time   NA 140 12/02/2023 0907   K 4.1 12/02/2023 0907   CL 103 12/02/2023 0907   CO2 31 12/02/2023 0907   GLUCOSE 89 12/02/2023 0907   BUN 17 12/02/2023 0907   CREATININE 0.77 12/02/2023 0907   CALCIUM 9.2 12/02/2023 0907   GFRNONAA >60 03/14/2022 1320    Lipid Panel     Component Value Date/Time   CHOL 197 12/02/2023 0907   TRIG 110 12/02/2023 0907   HDL 61 12/02/2023 0907   CHOLHDL 3.2 12/02/2023 0907   VLDL 21.4 03/25/2020 0749   LDLCALC 114 (H)  12/02/2023 0907    CBC    Component Value Date/Time   WBC 5.1 12/02/2023 0907   RBC 4.42 12/02/2023 0907   HGB 13.7 12/02/2023 0907   HCT 42.1 12/02/2023 0907   PLT 181 12/02/2023 0907   MCV 95.2 12/02/2023 0907   MCH 31.0 12/02/2023 0907   MCHC 32.5 12/02/2023 0907   RDW 12.5 12/02/2023 0907   LYMPHSABS 1.5 03/14/2022 0002   MONOABS 0.7 03/14/2022 0002   EOSABS 0.1 03/14/2022 0002   BASOSABS 0.0 03/14/2022 0002    Hgb A1C Lab Results  Component Value Date   HGBA1C 5.4 12/02/2023       Assessment and Plan: Assessment and Plan    Acute Stress Reaction Experiencing acute stress after pet loss. Current medications include Wellbutrin, Prozac, and Buspar. Short-term anxiolytic recommended. - Prescribed Ativan 0.5 mg, advised to start with half a tablet, increase to full tablet if needed.   RTC in 3 months for your annual exam.  Follow Up Instructions:    I discussed the assessment and treatment plan with the patient. The patient was provided an opportunity to ask questions and all were answered. The patient agreed with the plan and demonstrated an understanding of the instructions.   The patient was advised to call back or seek an in-person evaluation if the symptoms worsen or if the condition fails to improve as anticipated.   Anne Reaper, NP

## 2024-02-18 NOTE — Telephone Encounter (Signed)
 Copied from CRM [id number]. Topic: Clinical - Red Word Triage >> Feb 18, 2024  2:52 PM Elle L wrote: Red Word that prompted transfer to Nurse Triage: The patient's dog passed away and she is having an anxiety attack. Reason for Disposition  Patient sounds very upset or troubled to the triager  Recent traumatic event (e.g., death of a loved one, job loss, victim/witness of crime)  Answer Assessment - Initial Assessment Questions 1. CONCERN: "Did anything happen that prompted you to call today?"      Dog passed away yesterday 30/71/81 years old Kizzie Ide Tzu  "Caramel" 2. ANXIETY SYMPTOMS: "Can you describe how you (your loved one; patient) have been feeling?" (e.g., tense, restless, panicky, anxious, keyed up, overwhelmed, sense of impending doom).      Grief// extremely anxious  3. ONSET: "How long have you been feeling this way?" (e.g., hours, days, weeks)     days 4. SEVERITY: "How would you rate the level of anxiety?" (e.g., 0 - 10; or mild, moderate, severe).     severe 5. FUNCTIONAL IMPAIRMENT: "How have these feelings affected your ability to do daily activities?" "Have you had more difficulty than usual doing your normal daily activities?" (e.g., getting better, same, worse; self-care, school, work, interactions)     worrse 7. RISK OF HARM - SUICIDAL IDEATION: "Do you ever have thoughts of hurting or killing yourself?" If Yes, ask:  "Do you have these feelings now?" "Do you have a plan on how you would do this?"     denies 9. TREATMENT - THERAPIST: "Do you have a counselor or therapist? Name?"     no  11. PATIENT SUPPORT: "Who is with you now?" "Who do you live with?" "Do you have family or friends who you can talk to?"        Husband Harrold Donath  12. OTHER SYMPTOMS: "Do you have any other symptoms?" (e.g., feeling depressed, trouble concentrating, trouble sleeping, trouble breathing, palpitations or fast heartbeat, chest pain, sweating, nausea, or diarrhea)       Trouble sleeping and eating, "heart  is broken"  Protocols used: Anxiety and Panic Attack-A-AH

## 2024-02-19 ENCOUNTER — Other Ambulatory Visit: Payer: Self-pay | Admitting: Internal Medicine

## 2024-02-20 NOTE — Telephone Encounter (Signed)
 Requested Prescriptions  Pending Prescriptions Disp Refills   FLUoxetine (PROZAC) 40 MG capsule [Pharmacy Med Name: FLUOXETIN(P) CAP 40MG ] 90 capsule 1    Sig: TAKE 1 CAPSULE DAILY     Psychiatry:  Antidepressants - SSRI Passed - 02/20/2024 10:52 AM      Passed - Completed PHQ-2 or PHQ-9 in the last 360 days      Passed - Valid encounter within last 6 months    Recent Outpatient Visits           1 month ago Viral URI with cough   [place] [facility] [place], Netta Neat, DO   1 month ago Acute non-recurrent maxillary sinusitis   [employer] [facility] [place], Salvadore Oxford, NP   2 months ago Prediabetes   [place] [facility] [place], Salvadore Oxford, NP   9 months ago Encounter for general adult medical examination with abnormal findings   [place] [facility] [place], Salvadore Oxford, NP   1 year ago Prediabetes   [place] [facility] [facility] [facility] [place], Salvadore Oxford, NP       Future Appointments             In 3 months Baity, Salvadore Oxford, NP  [facility], [facility]

## 2024-03-11 ENCOUNTER — Other Ambulatory Visit: Payer: Self-pay | Admitting: Internal Medicine

## 2024-03-12 NOTE — Telephone Encounter (Signed)
 Requested Prescriptions  Pending Prescriptions Disp Refills   atorvastatin (LIPITOR) 10 MG tablet [Pharmacy Med Name: ATORVASTATIN CALCIUM 10MG  TABLET] 90 tablet 0    Sig: TAKE ONE TABLET (10 MG TOTAL) BY MOUTH DAILY.     Cardiovascular:  Antilipid - Statins Failed - 03/12/2024  5:31 PM      Failed - Valid encounter within last 12 months    Recent Outpatient Visits           3 weeks ago Acute stress reaction   [place] [facility] [place], Salvadore Oxford, NP       Future Appointments             In 2 months Sam[place] Si, Salvadore Oxford, NP [place] [facility]            Failed - Lipid Panel in normal range within the last 12 months    Cholesterol  Date Value Ref Range Status  12/02/2023 197 <200 mg/dL Final   LDL Cholesterol (Calc)  Date Value Ref Range Status  12/02/2023 114 (H) mg/dL (calc) Final    Comment:    Reference range: <100 . Desirable range <100 mg/dL for primary prevention;   <70 mg/dL for patients with CHD or diabetic patients  with > or = 2 CHD risk factors. Marland Kitchen LDL-C is now calculated using the Martin-Hopkins  calculation, which is a validated novel method providing  better accuracy than the Friedewald equation in the  estimation of LDL-C.  Horald Pollen et al. Lenox Ahr. 1191;478(29): 2061-2068  (http://education.[facility].com/faq/FAQ164)    HDL  Date Value Ref Range Status  12/02/2023 61 > OR = 50 mg/dL Final   Triglycerides  Date Value Ref Range Status  12/02/2023 110 <150 mg/dL Final         Passed - Patient is not pregnant

## 2024-03-25 ENCOUNTER — Encounter: Payer: Self-pay | Admitting: Internal Medicine

## 2024-04-06 DIAGNOSIS — D225 Melanocytic nevi of trunk: Secondary | ICD-10-CM | POA: Diagnosis not present

## 2024-04-06 DIAGNOSIS — D485 Neoplasm of uncertain behavior of skin: Secondary | ICD-10-CM | POA: Diagnosis not present

## 2024-04-06 DIAGNOSIS — L281 Prurigo nodularis: Secondary | ICD-10-CM | POA: Diagnosis not present

## 2024-04-06 DIAGNOSIS — L905 Scar conditions and fibrosis of skin: Secondary | ICD-10-CM | POA: Diagnosis not present

## 2024-04-06 DIAGNOSIS — Z1283 Encounter for screening for malignant neoplasm of skin: Secondary | ICD-10-CM | POA: Diagnosis not present

## 2024-04-10 ENCOUNTER — Other Ambulatory Visit: Payer: Self-pay | Admitting: Internal Medicine

## 2024-04-14 ENCOUNTER — Other Ambulatory Visit: Payer: Self-pay | Admitting: Internal Medicine

## 2024-04-14 NOTE — Telephone Encounter (Signed)
 Requested Prescriptions  Pending Prescriptions Disp Refills   lisinopril -hydrochlorothiazide  (ZESTORETIC ) 20-12.5 MG tablet [Pharmacy Med Name: LISINOPRIL /HYDROCHLOROTHIAZIDE  20-12.5 TABLET] 90 tablet 1    Sig: TAKE ONE TABLET BY MOUTH ONCE A DAY     Cardiovascular:  ACEI + Diuretic Combos Failed - 04/14/2024  8:41 AM      Failed - Valid encounter within last 6 months    Recent Outpatient Visits           1 month ago Acute stress reaction   [facility] [place], Rankin Buzzard, NP       Future Appointments             In 1 month [place], Rankin Buzzard, NP [facility]            Passed - Na in normal range and within 180 days    Sodium  Date Value Ref Range Status  12/02/2023 140 135 - 146 mmol/L Final         Passed - K in normal range and within 180 days    Potassium  Date Value Ref Range Status  12/02/2023 4.1 3.5 - 5.3 mmol/L Final         Passed - Cr in normal range and within 180 days    Creat  Date Value Ref Range Status  12/02/2023 0.77 0.50 - 1.03 mg/dL Final         Passed - eGFR is 30 or above and within 180 days    GFR, Estimated  Date Value Ref Range Status  03/14/2022 >60 >60 mL/min Final    Comment:    (NOTE) Calculated using the CKD-EPI Creatinine Equation (2021)    GFR  Date Value Ref Range Status  03/09/2021 86.70 >60.00 mL/min Final    Comment:    Calculated using the CKD-EPI Creatinine Equation (2021)   eGFR  Date Value Ref Range Status  12/02/2023 93 > OR = 60 mL/min/1.7m2 Final         Passed - Patient is not pregnant      Passed - Last BP in normal range    BP Readings from Last 1 Encounters:  01/08/24 132/84

## 2024-04-16 NOTE — Telephone Encounter (Signed)
 Requested medication (s) are due for refill today - yes  Requested medication (s) are on the active medication list -yes  Future visit scheduled -yes  Last refill: 12/13/22 #270 1RF  Notes to clinic: Patient taking differently than prescribed- please review for correct direction/Sig  Requested Prescriptions  Pending Prescriptions Disp Refills   busPIRone  (BUSPAR ) 10 MG tablet [Pharmacy Med Name: BUSPIRONE  HYDROCHLORIDE 10MG  TABLET] 270 tablet 1    Sig: TAKE ONE TABLET BY MOUTH THREE TIMES DAILY     Psychiatry: Anxiolytics/Hypnotics - Non-controlled Failed - 04/16/2024  1:14 PM      Failed - Valid encounter within last 12 months    Recent Outpatient Visits           1 month ago Acute stress reaction   [facility] [place], Rankin Buzzard, NP       Future Appointments             In 1 month [place], Rankin Buzzard, NP [facility]               Requested Prescriptions  Pending Prescriptions Disp Refills   busPIRone  (BUSPAR ) 10 MG tablet [Pharmacy Med Name: BUSPIRONE  HYDROCHLORIDE 10MG  TABLET] 270 tablet 1    Sig: TAKE ONE TABLET BY MOUTH THREE TIMES DAILY     Psychiatry: Anxiolytics/Hypnotics - Non-controlled Failed - 04/16/2024  1:14 PM      Failed - Valid encounter within last 12 months    Recent Outpatient Visits           1 month ago Acute stress reaction   [facility] [place], Rankin Buzzard, NP       Future Appointments             In 1 month [place], Rankin Buzzard, NP  [facility]

## 2024-04-29 ENCOUNTER — Ambulatory Visit (INDEPENDENT_AMBULATORY_CARE_PROVIDER_SITE_OTHER)

## 2024-04-29 ENCOUNTER — Encounter: Payer: Self-pay | Admitting: Podiatry

## 2024-04-29 ENCOUNTER — Ambulatory Visit: Admitting: Podiatry

## 2024-04-29 VITALS — Ht 64.0 in | Wt 192.0 lb

## 2024-04-29 DIAGNOSIS — M2011 Hallux valgus (acquired), right foot: Secondary | ICD-10-CM

## 2024-04-29 DIAGNOSIS — M7751 Other enthesopathy of right foot: Secondary | ICD-10-CM

## 2024-04-29 DIAGNOSIS — D485 Neoplasm of uncertain behavior of skin: Secondary | ICD-10-CM | POA: Diagnosis not present

## 2024-04-29 DIAGNOSIS — L988 Other specified disorders of the skin and subcutaneous tissue: Secondary | ICD-10-CM | POA: Diagnosis not present

## 2024-04-29 MED ORDER — MELOXICAM 15 MG PO TABS: 15.0000 mg | ORAL_TABLET | Freq: Every day | ORAL | 1 refills | Status: DC

## 2024-04-29 MED ORDER — BETAMETHASONE SOD PHOS & ACET 6 (3-3) MG/ML IJ SUSP
3.0000 mg | Freq: Once | INTRAMUSCULAR | Status: AC
  Administered 2024-04-29: 3 mg via INTRA_ARTICULAR

## 2024-04-29 MED ORDER — METHYLPREDNISOLONE 4 MG PO TBPK: ORAL_TABLET | ORAL | 0 refills | Status: DC

## 2024-04-29 NOTE — Progress Notes (Signed)
 Chief Complaint  Patient presents with   Bunions    Right foot Bunion painful. Chronic issue. 5 pain. Bunion sleeve and aleve  to treat. Non diabetic.    Subjective:  Patient presents today status post endoscopic plantar fasciotomy with gastroc aponeurosis lengthening right lower extremity.  DOS: 06/20/2023.  Patient no longer has any pain or tenderness to the plantar heel.  Since surgery she has been developing pain and tenderness to the plantar aspect of the right great toe.  Concern for possible bunion.  This has been ongoing now for a few months.  She wears good supportive tennis shoes but continues to have pain.  Past Medical History:  Diagnosis Date   Anxiety 06/01/2021   COVID 12/2020   headache, fever, stuffy nose, loss of taste and smell x few weeks all sx resolved except taste is altered   Depression 06/01/2021   Gastric ulcer    yrs ago no current problems per pt on 06-01-2021   GERD (gastroesophageal reflux disease) 06/01/2021   Hyperlipidemia 06/01/2021   Hypertension 06/01/2021   Hypothyroidism 06/01/2021   Menorrhagia 06/01/2021   Migraines 06/01/2021   regulae headaches also @ times   Nocturia 06/01/2021   Plantar fasciitis 06/01/2021   both feet right worse than left   Prediabetes    Sleep apnea 06/01/2021   cpap severe osa does not know cpap settings   SUI (stress urinary incontinence, female) 06/01/2021   Wears glasses 06/01/2021    Past Surgical History:  Procedure Laterality Date   ABDOMINAL HYSTERECTOMY     BLADDER SUSPENSION N/A 06/07/2021   Procedure: TRANSVAGINAL TAPE (TVT) PROCEDURE;  Surgeon: Dyanna Glasgow, DO;  Location: [facility];  Service: Gynecology;  Laterality: N/A;   CATARACT EXTRACTION, BILATERAL  07/11/2017   COLONOSCOPY WITH PROPOFOL  N/A 06/18/2022   Procedure: COLONOSCOPY WITH BIOPSY;  Surgeon: Marnee Sink, MD;  Location: [facility];  Service: Endoscopy;  Laterality: N/A;   DILITATION &  CURRETTAGE/HYSTROSCOPY WITH NOVASURE ABLATION N/A 01/10/2017   Procedure: DILATATION & CURETTAGE/HYSTEROSCOPY WITH NOVASURE ABLATION;  Surgeon: Dyanna Glasgow, DO;  Location: [facility];  Service: Gynecology;  Laterality: N/A;   ERCP N/A 03/15/2022   Procedure: ENDOSCOPIC RETROGRADE CHOLANGIOPANCREATOGRAPHY (ERCP);  Surgeon: Marnee Sink, MD;  Location: [facility];  Service: Endoscopy;  Laterality: N/A;   EYE SURGERY     FOOT SURGERY Right 06/2023   KNEE DISLOCATION SURGERY Right 06/2023   LAPAROSCOPIC TUBAL LIGATION Bilateral 01/10/2017   Procedure: LAPAROSCOPIC TUBAL LIGATION with Filshie Clips;  Surgeon: Dyanna Glasgow, DO;  Location: [facility];  Service: Gynecology;  Laterality: Bilateral;   LAPAROSCOPIC VAGINAL HYSTERECTOMY WITH SALPINGECTOMY Bilateral 06/07/2021   Procedure: LAPAROSCOPIC ASSISTED VAGINAL HYSTERECTOMY WITH BILATERAL SALPINGECTOMY;  Surgeon: Dyanna Glasgow, DO;  Location: [facility];  Service: Gynecology;  Laterality: Bilateral;   POLYPECTOMY N/A 06/18/2022   Procedure: POLYPECTOMY;  Surgeon: Marnee Sink, MD;  Location: [facility];  Service: Endoscopy;  Laterality: N/A;   TUBAL LIGATION     WISDOM TOOTH EXTRACTION  2013    Allergies  Allergen Reactions   Azithromycin Hives    Objective: Physical Exam General: The patient is alert and oriented x3 in no acute distress.  Dermatology: Skin is cool, dry and supple bilateral lower extremities. Negative for open lesions or macerations.  Vascular: Palpable pedal pulses bilaterally. No edema or erythema noted. Capillary refill within normal limits.  Neurological: Grossly intact via light touch  Musculoskeletal Exam: Tenderness with palpation around the first  MTP and plantar aspect of the first MTP.  Tenderness with range of motion as well.  Spasm noted during evaluation today of the abductor hallucis.  She says that she has been developing spasms to this area for  few months now  Radiographic exam RT foot 04/29/2024: Hallux valgus deformity noted with medial deviation of the first metatarsal head and increased intermetatarsal angle.  Assessment: 1. H/o endoscopic plantar fasciotomy with gastroc aponeurosis lengthening right. DOS: 06/20/2023 2.  Metatarsophalangeal joint capsulitis right 3.  Hallux valgus right 4.  Abductor hallucis muscle belly spasms right   Plan of Care:  -Patient was evaluated.   - Today we discussed in detail both conservative and surgical management of bunion deformity.  Postoperative recovery course was also explained in detail with the patient.  For now she is currently in the middle of moving and so we will continue to pursue conservative treatment -Injection of 0.5 cc Celestone  Soluspan injected in the first MTP of the right foot -Prescription for Medrol  Dosepak -Prescription for meloxicam  15 mg daily after completion of the Dosepak -Return to clinic PRN  Dot Gazella, DPM [facility]  Dr. Dot Gazella, DPM    [address] [place], [place] [address]                Office [phone]  Fax [phone]

## 2024-05-22 ENCOUNTER — Ambulatory Visit: Payer: Self-pay | Admitting: Internal Medicine

## 2024-05-22 ENCOUNTER — Encounter: Payer: Self-pay | Admitting: Internal Medicine

## 2024-05-22 VITALS — BP 128/84 | Ht 64.0 in | Wt 212.6 lb

## 2024-05-22 DIAGNOSIS — Z0001 Encounter for general adult medical examination with abnormal findings: Secondary | ICD-10-CM

## 2024-05-22 DIAGNOSIS — E559 Vitamin D deficiency, unspecified: Secondary | ICD-10-CM

## 2024-05-22 DIAGNOSIS — E7800 Pure hypercholesterolemia, unspecified: Secondary | ICD-10-CM

## 2024-05-22 DIAGNOSIS — E039 Hypothyroidism, unspecified: Secondary | ICD-10-CM | POA: Diagnosis not present

## 2024-05-22 DIAGNOSIS — G4730 Sleep apnea, unspecified: Secondary | ICD-10-CM

## 2024-05-22 DIAGNOSIS — S31109D Unspecified open wound of abdominal wall, unspecified quadrant without penetration into peritoneal cavity, subsequent encounter: Secondary | ICD-10-CM

## 2024-05-22 DIAGNOSIS — E538 Deficiency of other specified B group vitamins: Secondary | ICD-10-CM

## 2024-05-22 DIAGNOSIS — R7303 Prediabetes: Secondary | ICD-10-CM | POA: Diagnosis not present

## 2024-05-22 DIAGNOSIS — E66812 Obesity, class 2: Secondary | ICD-10-CM

## 2024-05-22 DIAGNOSIS — Z6836 Body mass index (BMI) 36.0-36.9, adult: Secondary | ICD-10-CM

## 2024-05-22 MED ORDER — SEMAGLUTIDE-WEIGHT MANAGEMENT 0.5 MG/0.5ML ~~LOC~~ SOAJ: 0.5000 mg | SUBCUTANEOUS | 0 refills | Status: DC

## 2024-05-22 MED ORDER — SILVER SULFADIAZINE 1 % EX CREA: 1.0000 | TOPICAL_CREAM | Freq: Every day | CUTANEOUS | 0 refills | Status: DC

## 2024-05-22 NOTE — Progress Notes (Signed)
 Subjective:    Patient ID: Anne Mcguire, female    DOB: [birth year], 53 y.o.   MRN: [id number]  HPI  Patient presents to clinic today for her annual exam.  Flu: 09/2023 Tetanus: 03/2022 COVID: Pfizer x 2 Shingrix : 04/2022, 10/2022 Pap smear: Hysterectomy Mammogram: 2024, [facility] [place] Colon screening: 06/2022 Vision screening: annually Dentist: biannually  Diet: She does eat meat. She consumes fruits and veggies. She does eat some fried foods. She drinks mostly coffee, tea Exercise: None  Review of Systems     Past Medical History:  Diagnosis Date   Anxiety 06/01/2021   COVID 12/2020   headache, fever, stuffy nose, loss of taste and smell x few weeks all sx resolved except taste is altered   Depression 06/01/2021   Gastric ulcer    yrs ago no current problems per pt on 06-01-2021   GERD (gastroesophageal reflux disease) 06/01/2021   Hyperlipidemia 06/01/2021   Hypertension 06/01/2021   Hypothyroidism 06/01/2021   Menorrhagia 06/01/2021   Migraines 06/01/2021   regulae headaches also @ times   Nocturia 06/01/2021   Plantar fasciitis 06/01/2021   both feet right worse than left   Prediabetes    Sleep apnea 06/01/2021   cpap severe osa does not know cpap settings   SUI (stress urinary incontinence, female) 06/01/2021   Wears glasses 06/01/2021    Current Outpatient Medications  Medication Sig Dispense Refill   atorvastatin  (LIPITOR) 10 MG tablet TAKE ONE TABLET (10 MG TOTAL) BY MOUTH DAILY. 90 tablet 0   buPROPion  (WELLBUTRIN  XL) 300 MG 24 hr tablet Take 1 tablet (300 mg total) by mouth daily. 90 tablet 1   busPIRone  (BUSPAR ) 10 MG tablet TAKE ONE TABLET BY MOUTH THREE TIMES DAILY 270 tablet 1   FLUoxetine  (PROZAC ) 40 MG capsule TAKE 1 CAPSULE DAILY 90 capsule 1   hydrOXYzine  (ATARAX ) 10 MG tablet Take 1 tablet (10 mg total) by mouth daily as needed. 90 tablet 0   ibuprofen  (ADVIL ) 600 MG tablet Take 1 tablet (600 mg total) by mouth every 8 (eight)  hours as needed. 30 tablet 0   levothyroxine  (SYNTHROID ) 88 MCG tablet TAKE ONE TABLET (88 MCG TOTAL) BY MOUTH DAILY. (Patient taking differently: Take 44 mcg by mouth daily before breakfast.) 90 tablet 2   lisinopril -hydrochlorothiazide  (ZESTORETIC ) 20-12.5 MG tablet TAKE ONE TABLET BY MOUTH ONCE A DAY 90 tablet 1   LORazepam  (ATIVAN ) 0.5 MG tablet Take 1 tablet (0.5 mg total) by mouth 2 (two) times daily as needed for anxiety. 10 tablet 0   meloxicam  (MOBIC ) 15 MG tablet Take 1 tablet (15 mg total) by mouth daily. 60 tablet 1   methylPREDNISolone  (MEDROL  DOSEPAK) 4 MG TBPK tablet 6 day dose pack - take as directed 21 tablet 0   omeprazole  (PRILOSEC) 20 MG capsule TAKE ONE CAPSULE BY MOUTH ONCE DAILY 90 capsule 0   No current facility-administered medications for this visit.    Allergies  Allergen Reactions   Azithromycin Hives    Family History  Problem Relation Age of Onset   Mental illness Mother    Arthritis Father    Hyperlipidemia Father    Hypertension Father    Diabetes Father    Arthritis Maternal Grandmother    Diabetes Maternal Grandmother    Arthritis Maternal Grandfather    Diabetes Maternal Grandfather    Arthritis Paternal Grandmother    Breast cancer Paternal Grandmother     Social History   Socioeconomic History   Marital status:  Married    Spouse name: Not on file   Number of children: Not on file   Years of education: Not on file   Highest education level: Bachelor's degree (e.g., BA, AB, BS)  Occupational History   Not on file  Tobacco Use   Smoking status: Never    Passive exposure: Never   Smokeless tobacco: Never  Vaping Use   Vaping status: Never Used  Substance and Sexual Activity   Alcohol use: Yes    Comment: occasional   Drug use: No   Sexual activity: Yes  Other Topics Concern   Not on file  Social History Narrative   Not on file   Social Drivers of Health   Financial Resource Strain: Low Risk  (05/21/2024)   Overall Financial  Resource Strain (CARDIA)    Difficulty of Paying Living Expenses: Not very hard  Food Insecurity: No Food Insecurity (05/21/2024)   Hunger Vital Sign    Worried About Running Out of Food in the Last Year: Never true    Ran Out of Food in the Last Year: Never true  Transportation Needs: No Transportation Needs (05/21/2024)   PRAPARE - Administrator, Civil Service (Medical): No    Lack of Transportation (Non-Medical): No  Physical Activity: Inactive (05/21/2024)   Exercise Vital Sign    Days of Exercise per Week: 0 days    Minutes of Exercise per Session: Not on file  Stress: Stress Concern Present (05/21/2024)   [employer] of Occupational Health - Occupational Stress Questionnaire    Feeling of Stress: Very much  Social Connections: Moderately Isolated (05/21/2024)   Social Connection and Isolation Panel    Frequency of Communication with Friends and Family: Never    Frequency of Social Gatherings with Friends and Family: Once a week    Attends Religious Services: 1 to 4 times per year    Active Member of [employer] or Organizations: No    Attends Engineer, structural: Not on file    Marital Status: Married  Catering manager Violence: Not on file     Constitutional: Patient reports intermittent headaches, fatigue.  Denies fever, malaise, fatigue, or abrupt weight changes.  HEENT: Denies eye pain, eye redness, ear pain, ringing in the ears, wax buildup, runny nose, nasal congestion, bloody nose, or sore throat. Respiratory: Denies difficulty breathing, shortness of breath, cough or sputum production.   Cardiovascular: Denies chest pain, chest tightness, palpitations or swelling in the hands or feet.  Gastrointestinal: Denies abdominal pain, bloating, constipation, diarrhea or blood in the stool.  GU: Denies urgency, frequency, pain with urination, burning sensation, blood in urine, odor or discharge. Musculoskeletal: Pt reports joint pain. Denies decrease in range  of motion, difficulty with gait, muscle pain or joint pain and swelling.  Skin: Patient reports open wound to right lower abdomen.  Denies redness, rashes, lesions.  Neurological: Patient reports insomnia.  Denies dizziness, difficulty with memory, difficulty with speech or problems with balance and coordination.  Psych: Patient has a history of anxiety and depression.  Denies SI/HI.  No other specific complaints in a complete review of systems (except as listed in HPI above).  Objective:   Physical Exam   BP 128/84 (BP Location: Left Arm, Patient Position: Sitting, Cuff Size: Normal)   Ht 5' 4 (1.626 m)   Wt 212 lb 9.6 oz (96.4 kg)   LMP 05/15/2021   BMI 36.49 kg/m    Wt Readings from Last 3 Encounters:  04/29/24 192  lb (87.1 kg)  01/08/24 192 lb (87.1 kg)  12/02/23 195 lb 12.8 oz (88.8 kg)    General: Appears her stated age, obese, in NAD. Skin: Warm, dry and intact.  1 cm open wound to the RLQ (prior biopsy site from abnormal).  Wound bed appears normal without drainage, or surrounding erythema. HEENT: Head: normal shape and size; Eyes: sclera white, no icterus, conjunctiva pink, PERRLA and EOMs intact;  Neck:  Neck supple, trachea midline. No masses, lumps or thyromegaly present.  Cardiovascular: Normal rate and rhythm. S1,S2 noted.  No murmur, rubs or gallops noted. No JVD or BLE edema. No carotid bruits noted. Pulmonary/Chest: Normal effort and positive vesicular breath sounds. No respiratory distress. No wheezes, rales or ronchi noted.  Abdomen: Normal bowel sounds.  Musculoskeletal: Strength 5/5 BUE/BLE. No difficulty with gait.  Neurological: Alert and oriented. Cranial nerves II-XII grossly intact. Coordination normal.  Psychiatric: Mood and affect normal. Behavior is normal. Judgment and thought content normal.   BMET    Component Value Date/Time   NA 140 12/02/2023 0907   K 4.1 12/02/2023 0907   CL 103 12/02/2023 0907   CO2 31 12/02/2023 0907   GLUCOSE 89  12/02/2023 0907   BUN 17 12/02/2023 0907   CREATININE 0.77 12/02/2023 0907   CALCIUM  9.2 12/02/2023 0907   GFRNONAA >60 03/14/2022 1320    Lipid Panel     Component Value Date/Time   CHOL 197 12/02/2023 0907   TRIG 110 12/02/2023 0907   HDL 61 12/02/2023 0907   CHOLHDL 3.2 12/02/2023 0907   VLDL 21.4 03/25/2020 0749   LDLCALC 114 (H) 12/02/2023 0907    CBC    Component Value Date/Time   WBC 5.1 12/02/2023 0907   RBC 4.42 12/02/2023 0907   HGB 13.7 12/02/2023 0907   HCT 42.1 12/02/2023 0907   PLT 181 12/02/2023 0907   MCV 95.2 12/02/2023 0907   MCH 31.0 12/02/2023 0907   MCHC 32.5 12/02/2023 0907   RDW 12.5 12/02/2023 0907   LYMPHSABS 1.5 03/14/2022 0002   MONOABS 0.7 03/14/2022 0002   EOSABS 0.1 03/14/2022 0002   BASOSABS 0.0 03/14/2022 0002    Hgb A1C Lab Results  Component Value Date   HGBA1C 5.4 12/02/2023           Assessment & Plan:   Preventative Health Maintenance:  Encouraged her to get a flu shot the fall Tetanus UTD Encouraged her to get her COVID booster Shingrix  UTD She no longer needs to screen for cervical cancer Mammogram UTD, will request copy Colon screening UTD Encouraged her to consume a balanced diet and exercise regimen Advised her to see an eye doctor and dentist annually We will check CBC, c-Met, TSH, free T4, lipid, A1c today  Fatigue, vitamin B12 deficiency, vitamin D  deficiency:  Will check vitamin D  and B12 today  Open wound of RLQ:  Rx for Silvadene 1% cream daily as needed Advised her if no improvement, she may want to consider calling dermatology for further recommendations  RTC in 6 months, follow-up chronic conditions Helayne Lo, NP

## 2024-05-22 NOTE — Assessment & Plan Note (Signed)
 Encouraged diet and exercise for weight loss Sample pack of Wegovy  0.25 mg weekly x 4 weeks We will try to get Wegovy  0.5 mg weekly approved given her history of severe sleep apnea

## 2024-05-22 NOTE — Patient Instructions (Signed)

## 2024-05-23 LAB — CBC
HCT: 40.8 % (ref 35.0–45.0)
Hemoglobin: 13.3 g/dL (ref 11.7–15.5)
MCH: 31.6 pg (ref 27.0–33.0)
MCHC: 32.6 g/dL (ref 32.0–36.0)
MCV: 96.9 fL (ref 80.0–100.0)
MPV: 11.4 fL (ref 7.5–12.5)
Platelets: 164 10*3/uL (ref 140–400)
RBC: 4.21 10*6/uL (ref 3.80–5.10)
RDW: 13 % (ref 11.0–15.0)
WBC: 7.6 10*3/uL (ref 3.8–10.8)

## 2024-05-23 LAB — COMPREHENSIVE METABOLIC PANEL WITH GFR
AG Ratio: 2 (calc) (ref 1.0–2.5)
ALT: 16 U/L (ref 6–29)
AST: 16 U/L (ref 10–35)
Albumin: 4.1 g/dL (ref 3.6–5.1)
Alkaline phosphatase (APISO): 46 U/L (ref 37–153)
BUN: 16 mg/dL (ref 7–25)
CO2: 31 mmol/L (ref 20–32)
Calcium: 8.9 mg/dL (ref 8.6–10.4)
Chloride: 104 mmol/L (ref 98–110)
Creat: 0.71 mg/dL (ref 0.50–1.03)
Globulin: 2.1 g/dL (ref 1.9–3.7)
Glucose, Bld: 100 mg/dL — ABNORMAL HIGH (ref 65–99)
Potassium: 4.3 mmol/L (ref 3.5–5.3)
Sodium: 140 mmol/L (ref 135–146)
Total Bilirubin: 0.3 mg/dL (ref 0.2–1.2)
Total Protein: 6.2 g/dL (ref 6.1–8.1)
eGFR: 102 mL/min/{1.73_m2} (ref >=60)

## 2024-05-23 LAB — LIPID PANEL
Cholesterol: 149 mg/dL (ref <200)
HDL: 71 mg/dL (ref >=50)
LDL Cholesterol (Calc): 62 mg/dL
Non-HDL Cholesterol (Calc): 78 mg/dL (ref <130)
Total CHOL/HDL Ratio: 2.1 (calc) (ref <5.0)
Triglycerides: 76 mg/dL (ref <150)

## 2024-05-23 LAB — HEMOGLOBIN A1C
Hgb A1c MFr Bld: 6 % — ABNORMAL HIGH (ref <5.7)
Mean Plasma Glucose: 126 mg/dL
eAG (mmol/L): 7 mmol/L

## 2024-05-23 LAB — T4, FREE: Free T4: 1.1 ng/dL (ref 0.8–1.8)

## 2024-05-23 LAB — TSH: TSH: 1.31 m[IU]/L

## 2024-05-23 LAB — VITAMIN D 25 HYDROXY (VIT D DEFICIENCY, FRACTURES): Vit D, 25-Hydroxy: 40 ng/mL (ref 30–100)

## 2024-05-23 LAB — VITAMIN B12: Vitamin B-12: 396 pg/mL (ref 200–1100)

## 2024-05-25 ENCOUNTER — Ambulatory Visit: Payer: Self-pay | Admitting: Internal Medicine

## 2024-06-04 ENCOUNTER — Other Ambulatory Visit: Payer: Self-pay

## 2024-06-04 DIAGNOSIS — Z0001 Encounter for general adult medical examination with abnormal findings: Secondary | ICD-10-CM

## 2024-06-04 DIAGNOSIS — G4730 Sleep apnea, unspecified: Secondary | ICD-10-CM

## 2024-06-04 MED ORDER — SEMAGLUTIDE-WEIGHT MANAGEMENT 0.5 MG/0.5ML ~~LOC~~ SOAJ: 0.5000 mg | SUBCUTANEOUS | 0 refills | Status: DC

## 2024-06-09 ENCOUNTER — Other Ambulatory Visit: Payer: Self-pay | Admitting: Internal Medicine

## 2024-06-09 ENCOUNTER — Other Ambulatory Visit: Payer: Self-pay

## 2024-06-09 DIAGNOSIS — G4730 Sleep apnea, unspecified: Secondary | ICD-10-CM

## 2024-06-09 DIAGNOSIS — Z0001 Encounter for general adult medical examination with abnormal findings: Secondary | ICD-10-CM

## 2024-06-09 MED ORDER — SEMAGLUTIDE-WEIGHT MANAGEMENT 0.5 MG/0.5ML ~~LOC~~ SOAJ
0.5000 mg | SUBCUTANEOUS | 0 refills | Status: DC
Start: 2024-06-09 — End: 2024-06-09

## 2024-06-09 MED ORDER — SEMAGLUTIDE-WEIGHT MANAGEMENT 0.5 MG/0.5ML ~~LOC~~ SOAJ: 0.5000 mg | SUBCUTANEOUS | 0 refills | Status: DC

## 2024-06-18 DIAGNOSIS — M25561 Pain in right knee: Secondary | ICD-10-CM | POA: Diagnosis not present

## 2024-06-18 DIAGNOSIS — R2689 Other abnormalities of gait and mobility: Secondary | ICD-10-CM | POA: Diagnosis not present

## 2024-06-18 DIAGNOSIS — M25571 Pain in right ankle and joints of right foot: Secondary | ICD-10-CM | POA: Diagnosis not present

## 2024-06-18 DIAGNOSIS — M6281 Muscle weakness (generalized): Secondary | ICD-10-CM | POA: Diagnosis not present

## 2024-06-23 DIAGNOSIS — R2689 Other abnormalities of gait and mobility: Secondary | ICD-10-CM | POA: Diagnosis not present

## 2024-06-23 DIAGNOSIS — M6281 Muscle weakness (generalized): Secondary | ICD-10-CM | POA: Diagnosis not present

## 2024-06-23 DIAGNOSIS — M25571 Pain in right ankle and joints of right foot: Secondary | ICD-10-CM | POA: Diagnosis not present

## 2024-06-23 DIAGNOSIS — M25561 Pain in right knee: Secondary | ICD-10-CM | POA: Diagnosis not present

## 2024-06-26 DIAGNOSIS — M25571 Pain in right ankle and joints of right foot: Secondary | ICD-10-CM | POA: Diagnosis not present

## 2024-06-26 DIAGNOSIS — M25561 Pain in right knee: Secondary | ICD-10-CM | POA: Diagnosis not present

## 2024-06-26 DIAGNOSIS — M6281 Muscle weakness (generalized): Secondary | ICD-10-CM | POA: Diagnosis not present

## 2024-06-26 DIAGNOSIS — R2689 Other abnormalities of gait and mobility: Secondary | ICD-10-CM | POA: Diagnosis not present

## 2024-06-29 DIAGNOSIS — R2689 Other abnormalities of gait and mobility: Secondary | ICD-10-CM | POA: Diagnosis not present

## 2024-06-29 DIAGNOSIS — M25571 Pain in right ankle and joints of right foot: Secondary | ICD-10-CM | POA: Diagnosis not present

## 2024-06-29 DIAGNOSIS — M25561 Pain in right knee: Secondary | ICD-10-CM | POA: Diagnosis not present

## 2024-06-29 DIAGNOSIS — M6281 Muscle weakness (generalized): Secondary | ICD-10-CM | POA: Diagnosis not present

## 2024-07-06 ENCOUNTER — Other Ambulatory Visit: Payer: Self-pay | Admitting: Internal Medicine

## 2024-07-07 DIAGNOSIS — M25571 Pain in right ankle and joints of right foot: Secondary | ICD-10-CM | POA: Diagnosis not present

## 2024-07-07 DIAGNOSIS — R2689 Other abnormalities of gait and mobility: Secondary | ICD-10-CM | POA: Diagnosis not present

## 2024-07-07 DIAGNOSIS — M6281 Muscle weakness (generalized): Secondary | ICD-10-CM | POA: Diagnosis not present

## 2024-07-07 DIAGNOSIS — M25561 Pain in right knee: Secondary | ICD-10-CM | POA: Diagnosis not present

## 2024-07-07 NOTE — Procedures (Signed)
  The patient arrived to the [facility] with overall mask discomfort. The patient was desensitized to the CPAP device at a pressure of 9cm H20 with the use of a Medium size Fisher [employer] Paykel Simplus and [place] Respironic Dreamwear Full Face Mask. The patient stated that both mask felt much better than her current home mask. Both mask type were given to the patient for home use.

## 2024-07-07 NOTE — Telephone Encounter (Signed)
 Requested Prescriptions  Pending Prescriptions Disp Refills   atorvastatin  (LIPITOR) 10 MG tablet [Pharmacy Med Name: ATORVASTATIN  CALCIUM  10MG  TABLET] 90 tablet 3    Sig: TAKE ONE TABLET (10 MG TOTAL) BY MOUTH DAILY.     Cardiovascular:  Antilipid - Statins Failed - 07/07/2024  2:33 PM      Failed - Lipid Panel in normal range within the last 12 months    Cholesterol  Date Value Ref Range Status  05/22/2024 149 <200 mg/dL Final   LDL Cholesterol (Calc)  Date Value Ref Range Status  05/22/2024 62 mg/dL (calc) Final    Comment:    Reference range: <100 . Desirable range <100 mg/dL for primary prevention;   <70 mg/dL for patients with CHD or diabetic patients  with > or = 2 CHD risk factors. SABRA LDL-C is now calculated using the Martin-Hopkins  calculation, which is a validated novel method providing  better accuracy than the Friedewald equation in the  estimation of LDL-C.  Gladis APPLETHWAITE et al. SANDREA. 7986;689(80): 2061-2068  (http://education.[facility].com/faq/FAQ164)    HDL  Date Value Ref Range Status  05/22/2024 71 > OR = 50 mg/dL Final   Triglycerides  Date Value Ref Range Status  05/22/2024 76 <150 mg/dL Final         Passed - Patient is not pregnant      Passed - Valid encounter within last 12 months    Recent Outpatient Visits           1 month ago Encounter for general adult medical examination with abnormal findings   [place] [place], Angeline ORN, NP   4 months ago Acute stress reaction   [facility] [place], Angeline ORN, [place]

## 2024-07-09 DIAGNOSIS — M6281 Muscle weakness (generalized): Secondary | ICD-10-CM | POA: Diagnosis not present

## 2024-07-09 DIAGNOSIS — M25561 Pain in right knee: Secondary | ICD-10-CM | POA: Diagnosis not present

## 2024-07-09 DIAGNOSIS — R2689 Other abnormalities of gait and mobility: Secondary | ICD-10-CM | POA: Diagnosis not present

## 2024-07-09 DIAGNOSIS — M25571 Pain in right ankle and joints of right foot: Secondary | ICD-10-CM | POA: Diagnosis not present

## 2024-07-14 DIAGNOSIS — M6281 Muscle weakness (generalized): Secondary | ICD-10-CM | POA: Diagnosis not present

## 2024-07-14 DIAGNOSIS — R2689 Other abnormalities of gait and mobility: Secondary | ICD-10-CM | POA: Diagnosis not present

## 2024-07-14 DIAGNOSIS — M25571 Pain in right ankle and joints of right foot: Secondary | ICD-10-CM | POA: Diagnosis not present

## 2024-07-14 DIAGNOSIS — M25561 Pain in right knee: Secondary | ICD-10-CM | POA: Diagnosis not present

## 2024-07-16 DIAGNOSIS — M6281 Muscle weakness (generalized): Secondary | ICD-10-CM | POA: Diagnosis not present

## 2024-07-16 DIAGNOSIS — M25571 Pain in right ankle and joints of right foot: Secondary | ICD-10-CM | POA: Diagnosis not present

## 2024-07-16 DIAGNOSIS — R2689 Other abnormalities of gait and mobility: Secondary | ICD-10-CM | POA: Diagnosis not present

## 2024-07-16 DIAGNOSIS — M25561 Pain in right knee: Secondary | ICD-10-CM | POA: Diagnosis not present

## 2024-07-20 DIAGNOSIS — M6281 Muscle weakness (generalized): Secondary | ICD-10-CM | POA: Diagnosis not present

## 2024-07-20 DIAGNOSIS — M25561 Pain in right knee: Secondary | ICD-10-CM | POA: Diagnosis not present

## 2024-07-20 DIAGNOSIS — M25571 Pain in right ankle and joints of right foot: Secondary | ICD-10-CM | POA: Diagnosis not present

## 2024-07-20 DIAGNOSIS — R2689 Other abnormalities of gait and mobility: Secondary | ICD-10-CM | POA: Diagnosis not present

## 2024-07-21 ENCOUNTER — Other Ambulatory Visit: Payer: Self-pay | Admitting: Internal Medicine

## 2024-07-22 DIAGNOSIS — M25571 Pain in right ankle and joints of right foot: Secondary | ICD-10-CM | POA: Diagnosis not present

## 2024-07-22 DIAGNOSIS — M6281 Muscle weakness (generalized): Secondary | ICD-10-CM | POA: Diagnosis not present

## 2024-07-22 DIAGNOSIS — M25561 Pain in right knee: Secondary | ICD-10-CM | POA: Diagnosis not present

## 2024-07-22 DIAGNOSIS — R2689 Other abnormalities of gait and mobility: Secondary | ICD-10-CM | POA: Diagnosis not present

## 2024-07-23 ENCOUNTER — Encounter: Payer: Self-pay | Admitting: Podiatry

## 2024-07-23 NOTE — Telephone Encounter (Signed)
 Sent pt a mess via MyChart to adv we can do the placard. I inq if she wanted to pick at office or I can email to addr on file. The form is complete

## 2024-07-23 NOTE — Telephone Encounter (Signed)
 Let pt know via MyChart to check email for the form. She confirmed email addr was correct

## 2024-07-23 NOTE — Telephone Encounter (Signed)
 Requested Prescriptions  Pending Prescriptions Disp Refills   lisinopril -hydrochlorothiazide  (ZESTORETIC ) 20-12.5 MG tablet [Pharmacy Med Name: LISINOPRIL /HYDROCHLOROTHIAZIDE  20-12.5 TABLET] 90 tablet 1    Sig: TAKE ONE TABLET BY MOUTH ONCE A DAY     Cardiovascular:  ACEI + Diuretic Combos Passed - 07/23/2024  4:29 PM      Passed - Na in normal range and within 180 days    Sodium  Date Value Ref Range Status  05/22/2024 140 135 - 146 mmol/L Final         Passed - K in normal range and within 180 days    Potassium  Date Value Ref Range Status  05/22/2024 4.3 3.5 - 5.3 mmol/L Final         Passed - Cr in normal range and within 180 days    Creat  Date Value Ref Range Status  05/22/2024 0.71 0.50 - 1.03 mg/dL Final         Passed - eGFR is 30 or above and within 180 days    GFR, Estimated  Date Value Ref Range Status  03/14/2022 >60 >60 mL/min Final    Comment:    (NOTE) Calculated using the CKD-EPI Creatinine Equation (2021)    GFR  Date Value Ref Range Status  03/09/2021 86.70 >60.00 mL/min Final    Comment:    Calculated using the CKD-EPI Creatinine Equation (2021)   eGFR  Date Value Ref Range Status  05/22/2024 102 > OR = 60 mL/min/1.29m2 Final         Passed - Patient is not pregnant      Passed - Last BP in normal range    BP Readings from Last 1 Encounters:  05/22/24 128/84         Passed - Valid encounter within last 6 months    Recent Outpatient Visits           2 months ago Encounter for general adult medical examination with abnormal findings   [facility] [place], Angeline ORN, NP   5 months ago Acute stress reaction   [facility], [place], NP               buPROPion  (WELLBUTRIN  XL) 300 MG 24 hr tablet [Pharmacy Med Name: BUPROPION  HYDROCHLORIDE ER (XL) 300MG  XL TABLET ER 24HR] 90 tablet 1    Sig: TAKE ONE TABLET (300 MG TOTAL) BY MOUTH DAILY.     Psychiatry: Antidepressants -  bupropion  Passed - 07/23/2024  4:29 PM      Passed - Cr in normal range and within 360 days    Creat  Date Value Ref Range Status  05/22/2024 0.71 0.50 - 1.03 mg/dL Final         Passed - AST in normal range and within 360 days    AST  Date Value Ref Range Status  05/22/2024 16 10 - 35 U/L Final         Passed - ALT in normal range and within 360 days    ALT  Date Value Ref Range Status  05/22/2024 16 6 - 29 U/L Final         Passed - Completed PHQ-2 or PHQ-9 in the last 360 days      Passed - Last BP in normal range    BP Readings from Last 1 Encounters:  05/22/24 128/84         Passed - Valid encounter within last 6 months  Recent Outpatient Visits           2 months ago Encounter for general adult medical examination with abnormal findings   [facility] [place], Angeline ORN, NP   5 months ago Acute stress reaction   [facility] [place], Angeline ORN, [place]

## 2024-07-23 NOTE — Telephone Encounter (Signed)
 Placard app left for pt at front desk/check in. She is unable to access via email.  See prev notes.

## 2024-07-28 DIAGNOSIS — R2689 Other abnormalities of gait and mobility: Secondary | ICD-10-CM | POA: Diagnosis not present

## 2024-07-28 DIAGNOSIS — M6281 Muscle weakness (generalized): Secondary | ICD-10-CM | POA: Diagnosis not present

## 2024-07-28 DIAGNOSIS — M25561 Pain in right knee: Secondary | ICD-10-CM | POA: Diagnosis not present

## 2024-07-28 DIAGNOSIS — M25571 Pain in right ankle and joints of right foot: Secondary | ICD-10-CM | POA: Diagnosis not present

## 2024-07-29 ENCOUNTER — Telehealth: Payer: Self-pay | Admitting: Primary Care

## 2024-07-29 NOTE — Telephone Encounter (Signed)
   Patient had mask desensitization study in July 2025, she is over due for follow-up with out office for OSA/CPAP management- if not following with another provider please schedule.    The patient arrived to the [facility] with overall mask discomfort. The patient was desensitized to the CPAP device at a pressure of 9cm H20 with the use of a Medium size Fisher & Paykel Simplus and [place] Respironic Dreamwear Full Face Mask. The patient stated that both mask felt much better than her current home mask. Both mask type were given to the patient for home use.

## 2024-07-29 NOTE — Telephone Encounter (Signed)
 Called patient.  Gave information.  Patient states she will call back when she has her schedule available to make a return office visit to see Beth.

## 2024-08-03 DIAGNOSIS — M25571 Pain in right ankle and joints of right foot: Secondary | ICD-10-CM | POA: Diagnosis not present

## 2024-08-03 DIAGNOSIS — M25561 Pain in right knee: Secondary | ICD-10-CM | POA: Diagnosis not present

## 2024-08-03 DIAGNOSIS — M6281 Muscle weakness (generalized): Secondary | ICD-10-CM | POA: Diagnosis not present

## 2024-08-03 DIAGNOSIS — R2689 Other abnormalities of gait and mobility: Secondary | ICD-10-CM | POA: Diagnosis not present

## 2024-08-04 ENCOUNTER — Ambulatory Visit: Admitting: Surgery

## 2024-08-04 ENCOUNTER — Encounter: Payer: Self-pay | Admitting: Surgery

## 2024-08-04 VITALS — BP 146/80 | HR 76 | Temp 98.4°F | Ht 64.0 in | Wt 209.2 lb

## 2024-08-04 DIAGNOSIS — K862 Cyst of pancreas: Secondary | ICD-10-CM

## 2024-08-04 DIAGNOSIS — R233 Spontaneous ecchymoses: Secondary | ICD-10-CM

## 2024-08-04 DIAGNOSIS — T148XXA Other injury of unspecified body region, initial encounter: Secondary | ICD-10-CM

## 2024-08-04 NOTE — Patient Instructions (Signed)
 Side Pain (Flank Plain) in Adults: What it Means  Flank pain is pain in your side. The flank is the area on your side between your upper belly (abdomen) and your spine. The pain may be sudden, or it may be long-term. It may be mild or very bad. Treatment will depend on what's causing your flank pain. Possible causes of flank pain Pain in this area can be caused by many things, such as: Muscle soreness or injury. Kidney infection, kidney stones, or kidney disease. Stress. A disease of the spine. Lung problems like: A lung infection (pneumonia). Fluid around the lungs. Shingles. This is a skin rash caused by the chickenpox virus. Problems in the belly like: Trouble pooping (constipation). Appendicitis. Gallbladder disease. Abnormal growth of cells or tissue (tumors). Follow these instructions at home: Drink more fluids as told. Rest as told. Take your medicines only as told. Keep a journal to keep track of: What causes your flank pain. What makes your flank pain feel better. Keep all follow-up visits. Your health care provider will need to check on your condition. Contact a health care provider if: Medicine doesn't help your pain. You have new symptoms. Your pain gets worse. Your symptoms last longer than 2-3 days. You're peeing more often than normal. You have flank pain and a fever. You feel like you may throw up or you throw up. You have blood in your pee. Get help right away if: You have trouble breathing. You're short of breath. Your belly hurts, or it's swollen or red. You feel like you'll faint, or you faint. These symptoms may be an emergency. Call 911 right away. Do not wait to see if the symptoms will go away. Do not drive yourself to the hospital. This information is not intended to replace advice given to you by your health care provider. Make sure you discuss any questions you have with your health care provider. Document Revised: 03/20/2024 Document Reviewed:  03/20/2024 Elsevier Patient Education  2025 [employer].

## 2024-08-04 NOTE — Progress Notes (Signed)
 Patient ID: Anne Mcguire, female   DOB: [birth year], 53 y.o.   MRN: [id number]  Chief Complaint: Bruise left hip  History of Present Illness Anne Mcguire is a 53 y.o. female with a pain in the left side, small bruise that became more of a sizable blackish discolored hematoma over the week.  Had been moving, very stressed lately.  Had gone without eating all day long before having some fried mushrooms and then had a vomiting spell just last night.  She denies any nausea since that time and this was an isolated event.  What she vomited was just the food nothing bilious nothing bloody.  She denies fevers and chills. She stopped Wegovy  due to cost, and may be restarted for her obstructive sleep apnea but has not pursued that yet.  No other episodes of spontaneous bleeding or unexplained bruising noted.  She was concerned that this may reflect some internal bleeding and presented today.  Past Medical History Past Medical History:  Diagnosis Date   Anxiety 06/01/2021   COVID 12/2020   headache, fever, stuffy nose, loss of taste and smell x few weeks all sx resolved except taste is altered   Depression 06/01/2021   Gastric ulcer    yrs ago no current problems per pt on 06-01-2021   GERD (gastroesophageal reflux disease) 06/01/2021   Hyperlipidemia 06/01/2021   Hypertension 06/01/2021   Hypothyroidism 06/01/2021   Menorrhagia 06/01/2021   Migraines 06/01/2021   regulae headaches also @ times   Nocturia 06/01/2021   Plantar fasciitis 06/01/2021   both feet right worse than left   Prediabetes    Sleep apnea 06/01/2021   cpap severe osa does not know cpap settings   SUI (stress urinary incontinence, female) 06/01/2021   Wears glasses 06/01/2021      Past Surgical History:  Procedure Laterality Date   ABDOMINAL HYSTERECTOMY     BLADDER SUSPENSION N/A 06/07/2021   Procedure: TRANSVAGINAL TAPE (TVT) PROCEDURE;  Surgeon: Dannielle Bouchard, DO;  Location: [facility];   Service: Gynecology;  Laterality: N/A;   CATARACT EXTRACTION, BILATERAL  07/11/2017   CHOLECYSTECTOMY     COLONOSCOPY WITH PROPOFOL  N/A 06/18/2022   Procedure: COLONOSCOPY WITH BIOPSY;  Surgeon: Jinny Carmine, MD;  Location: [facility];  Service: Endoscopy;  Laterality: N/A;   DILITATION & CURRETTAGE/HYSTROSCOPY WITH NOVASURE ABLATION N/A 01/10/2017   Procedure: DILATATION & CURETTAGE/HYSTEROSCOPY WITH NOVASURE ABLATION;  Surgeon: Bouchard Dannielle, DO;  Location: [facility];  Service: Gynecology;  Laterality: N/A;   ERCP N/A 03/15/2022   Procedure: ENDOSCOPIC RETROGRADE CHOLANGIOPANCREATOGRAPHY (ERCP);  Surgeon: Jinny Carmine, MD;  Location: [facility];  Service: Endoscopy;  Laterality: N/A;   EYE SURGERY     FOOT SURGERY Right 06/2023   KNEE DISLOCATION SURGERY Right 06/2023   LAPAROSCOPIC TUBAL LIGATION Bilateral 01/10/2017   Procedure: LAPAROSCOPIC TUBAL LIGATION with Filshie Clips;  Surgeon: Bouchard Dannielle, DO;  Location: [facility];  Service: Gynecology;  Laterality: Bilateral;   LAPAROSCOPIC VAGINAL HYSTERECTOMY WITH SALPINGECTOMY Bilateral 06/07/2021   Procedure: LAPAROSCOPIC ASSISTED VAGINAL HYSTERECTOMY WITH BILATERAL SALPINGECTOMY;  Surgeon: Dannielle Bouchard, DO;  Location: [facility];  Service: Gynecology;  Laterality: Bilateral;   POLYPECTOMY N/A 06/18/2022   Procedure: POLYPECTOMY;  Surgeon: Jinny Carmine, MD;  Location: [facility];  Service: Endoscopy;  Laterality: N/A;   TUBAL LIGATION     WISDOM TOOTH EXTRACTION  2013    Allergies  Allergen Reactions   Azithromycin Hives    Current Outpatient  Medications  Medication Sig Dispense Refill   atorvastatin  (LIPITOR) 10 MG tablet TAKE ONE TABLET (10 MG TOTAL) BY MOUTH DAILY. 90 tablet 3   buPROPion  (WELLBUTRIN  XL) 300 MG 24 hr tablet TAKE ONE TABLET (300 MG TOTAL) BY MOUTH DAILY. 90 tablet 1   busPIRone  (BUSPAR ) 10 MG tablet TAKE ONE TABLET BY MOUTH THREE TIMES DAILY  270 tablet 1   FLUoxetine  (PROZAC ) 40 MG capsule TAKE 1 CAPSULE DAILY 90 capsule 1   hydrOXYzine  (ATARAX ) 10 MG tablet Take 1 tablet (10 mg total) by mouth daily as needed. 90 tablet 0   levothyroxine  (SYNTHROID ) 88 MCG tablet TAKE ONE TABLET (88 MCG TOTAL) BY MOUTH DAILY. (Patient taking differently: Take 44 mcg by mouth daily before breakfast.) 90 tablet 2   lisinopril -hydrochlorothiazide  (ZESTORETIC ) 20-12.5 MG tablet TAKE ONE TABLET BY MOUTH ONCE A DAY 90 tablet 1   LORazepam  (ATIVAN ) 0.5 MG tablet Take 1 tablet (0.5 mg total) by mouth 2 (two) times daily as needed for anxiety. 10 tablet 0   meloxicam  (MOBIC ) 15 MG tablet Take 1 tablet (15 mg total) by mouth daily. 60 tablet 1   omeprazole  (PRILOSEC) 20 MG capsule TAKE ONE CAPSULE BY MOUTH ONCE DAILY 90 capsule 0   silver  sulfADIAZINE  (SILVADENE ) 1 % cream Apply 1 Application topically daily. 50 g 0   No current facility-administered medications for this visit.    Family History Family History  Problem Relation Age of Onset   Mental illness Mother    Depression Mother    Arthritis Father    Hyperlipidemia Father    Hypertension Father    Diabetes Father    Arthritis Maternal Grandmother    Diabetes Maternal Grandmother    Arthritis Maternal Grandfather    Diabetes Maternal Grandfather    Arthritis Paternal Grandmother    Breast cancer Paternal Grandmother    Cancer Paternal Grandmother       Social History Social History   Tobacco Use   Smoking status: Never    Passive exposure: Never   Smokeless tobacco: Never  Vaping Use   Vaping status: Never Used  Substance Use Topics   Alcohol use: Yes    Comment: occasional   Drug use: No      Physical Exam Blood pressure (!) 146/80, pulse 76, temperature 98.4 F (36.9 C), temperature source Oral, height 5' 4 (1.626 m), weight 209 lb 3.2 oz (94.9 kg), last menstrual period 05/15/2021, SpO2 97%. Last Weight  Most recent update: 08/04/2024  3:24 PM    Weight  94.9 kg (209  lb 3.2 oz)             CONSTITUTIONAL: Well developed, and nourished, appropriately responsive and aware without distress.   EYES: Sclera non-icteric.   EARS, NOSE, MOUTH AND THROAT:  The oropharynx is clear. Oral mucosa is pink and moist.    Hearing is intact to voice.  NECK: Trachea is midline, and there is no jugular venous distension.  LYMPH NODES:  Lymph nodes in the neck are not appreciated. RESPIRATORY:  Normal respiratory effort without pathologic use of accessory muscles. CARDIOVASCULAR:  Well perfused.  GI: The abdomen is notable for a nonpalpable bruised circle about 4 cm in diameter overlying the left anterior superior iliac spine/crest area otherwise soft, nontender, and nondistended. There were no palpable masses.   MUSCULOSKELETAL:  Symmetrical muscle tone appreciated in all four extremities.    SKIN: Skin turgor is normal. No pathologic skin lesions appreciated.  NEUROLOGIC:  Motor and sensation appear  grossly normal.  Cranial nerves are grossly without defect. PSYCH:  Alert and oriented to person, place and time. Affect is appropriate for situation.  Data Reviewed I have personally reviewed what is currently available of the patient's imaging, recent labs and medical records.   Labs:     Latest Ref Rng & Units 05/22/2024    8:32 AM 12/02/2023    9:07 AM 05/17/2023    2:21 PM  CBC  WBC 3.8 - 10.8 Thousand/uL 7.6  5.1  7.2   Hemoglobin 11.7 - 15.5 g/dL 86.6  86.2  85.5   Hematocrit 35.0 - 45.0 % 40.8  42.1  42.6   Platelets 140 - 400 Thousand/uL 164  181  174       Latest Ref Rng & Units 05/22/2024    8:32 AM 12/02/2023    9:07 AM 05/17/2023    2:21 PM  CMP  Glucose 65 - 99 mg/dL 899  89  80   BUN 7 - 25 mg/dL 16  17  12    Creatinine 0.50 - 1.03 mg/dL 9.28  9.22  9.17   Sodium 135 - 146 mmol/L 140  140  139   Potassium 3.5 - 5.3 mmol/L 4.3  4.1  4.0   Chloride 98 - 110 mmol/L 104  103  103   CO2 20 - 32 mmol/L 31  31  27    Calcium  8.6 - 10.4 mg/dL 8.9  9.2  9.5    Total Protein 6.1 - 8.1 g/dL 6.2  6.7  7.1   Total Bilirubin 0.2 - 1.2 mg/dL 0.3  0.6  0.7   AST 10 - 35 U/L 16  16  15    ALT 6 - 29 U/L 16  12  15        Imaging: Radiological images personally reviewed:  She reminded me that we are annually completing an MRI to follow a pancreatic lesion, that is likely a benign sidebranch IPMN.  She will have a repeat MRI in November.  Within last 24 hrs: No results found.  Assessment    Unexplained bruising left hip area, suspect within expectations of soft tissue.  No evidence of hematoma, unlikely of any internal organ injury. Patient Active Problem List   Diagnosis Date Noted   Chronic right shoulder pain 12/02/2023   Prediabetes 09/27/2021   Class 2 obesity due to excess calories with body mass index (BMI) of 36.0 to 36.9 in adult 09/27/2021   Severe sleep apnea 01/18/2021   HLD (hyperlipidemia) 08/26/2017   Migraines 08/26/2017   Essential hypertension 01/28/2017   Insomnia 04/02/2014   Anxiety and depression 02/19/2014   Hypothyroidism 02/19/2014   GERD (gastroesophageal reflux disease) 02/19/2014    Plan    We discussed we could repeat lab work, advised monitoring for any trend of easy bruisability or unexplained bleeding.  Reassurance is given considering this isolated event.  And I do not feel we need to treat nausea or manage the episode of vomiting from last night. Advise she follow-up with her primary care which she has.  I will be glad to see her again should new or worsening symptoms occur.    I personally spent a total of 30 minutes in the care of the patient today including preparing to see the patient, getting/reviewing separately obtained history, performing a medically appropriate exam/evaluation, counseling and educating, and documenting clinical information in the EHR.   These notes generated with voice recognition software. I apologize for typographical errors.  Honor Leghorn M.D., FACS  08/04/2024, 3:43  PM

## 2024-08-07 DIAGNOSIS — M6281 Muscle weakness (generalized): Secondary | ICD-10-CM | POA: Diagnosis not present

## 2024-08-07 DIAGNOSIS — R2689 Other abnormalities of gait and mobility: Secondary | ICD-10-CM | POA: Diagnosis not present

## 2024-08-07 DIAGNOSIS — M25571 Pain in right ankle and joints of right foot: Secondary | ICD-10-CM | POA: Diagnosis not present

## 2024-08-07 DIAGNOSIS — M25561 Pain in right knee: Secondary | ICD-10-CM | POA: Diagnosis not present

## 2024-08-11 ENCOUNTER — Other Ambulatory Visit: Payer: Self-pay | Admitting: Internal Medicine

## 2024-08-11 NOTE — Telephone Encounter (Signed)
 Requested Prescriptions  Pending Prescriptions Disp Refills   FLUoxetine  (PROZAC ) 40 MG capsule [Pharmacy Med Name: FLUOXETIN(P) CAP 40MG ] 90 capsule 1    Sig: TAKE 1 CAPSULE DAILY     Psychiatry:  Antidepressants - SSRI Passed - 08/11/2024  3:46 PM      Passed - Completed PHQ-2 or PHQ-9 in the last 360 days      Passed - Valid encounter within last 6 months    Recent Outpatient Visits           2 months ago Encounter for general adult medical examination with abnormal findings   [place] [facility] [place], Angeline ORN, NP   5 months ago Acute stress reaction   [place] [facility] [place], Angeline ORN, [place]

## 2024-08-17 DIAGNOSIS — R319 Hematuria, unspecified: Secondary | ICD-10-CM | POA: Diagnosis not present

## 2024-08-17 DIAGNOSIS — Z01419 Encounter for gynecological examination (general) (routine) without abnormal findings: Secondary | ICD-10-CM | POA: Diagnosis not present

## 2024-08-17 DIAGNOSIS — N950 Postmenopausal bleeding: Secondary | ICD-10-CM | POA: Diagnosis not present

## 2024-08-17 DIAGNOSIS — N939 Abnormal uterine and vaginal bleeding, unspecified: Secondary | ICD-10-CM | POA: Diagnosis not present

## 2024-08-18 DIAGNOSIS — M25561 Pain in right knee: Secondary | ICD-10-CM | POA: Diagnosis not present

## 2024-08-18 DIAGNOSIS — R2689 Other abnormalities of gait and mobility: Secondary | ICD-10-CM | POA: Diagnosis not present

## 2024-08-18 DIAGNOSIS — M25571 Pain in right ankle and joints of right foot: Secondary | ICD-10-CM | POA: Diagnosis not present

## 2024-08-18 DIAGNOSIS — M6281 Muscle weakness (generalized): Secondary | ICD-10-CM | POA: Diagnosis not present

## 2024-08-19 DIAGNOSIS — D225 Melanocytic nevi of trunk: Secondary | ICD-10-CM | POA: Diagnosis not present

## 2024-08-19 DIAGNOSIS — D485 Neoplasm of uncertain behavior of skin: Secondary | ICD-10-CM | POA: Diagnosis not present

## 2024-08-19 DIAGNOSIS — Z1283 Encounter for screening for malignant neoplasm of skin: Secondary | ICD-10-CM | POA: Diagnosis not present

## 2024-08-20 DIAGNOSIS — M6281 Muscle weakness (generalized): Secondary | ICD-10-CM | POA: Diagnosis not present

## 2024-08-20 DIAGNOSIS — R2689 Other abnormalities of gait and mobility: Secondary | ICD-10-CM | POA: Diagnosis not present

## 2024-08-20 DIAGNOSIS — M25561 Pain in right knee: Secondary | ICD-10-CM | POA: Diagnosis not present

## 2024-08-20 DIAGNOSIS — M25571 Pain in right ankle and joints of right foot: Secondary | ICD-10-CM | POA: Diagnosis not present

## 2024-08-24 ENCOUNTER — Other Ambulatory Visit: Payer: Self-pay | Admitting: Internal Medicine

## 2024-08-25 DIAGNOSIS — R2689 Other abnormalities of gait and mobility: Secondary | ICD-10-CM | POA: Diagnosis not present

## 2024-08-25 DIAGNOSIS — M25561 Pain in right knee: Secondary | ICD-10-CM | POA: Diagnosis not present

## 2024-08-25 DIAGNOSIS — M6281 Muscle weakness (generalized): Secondary | ICD-10-CM | POA: Diagnosis not present

## 2024-08-25 DIAGNOSIS — M25571 Pain in right ankle and joints of right foot: Secondary | ICD-10-CM | POA: Diagnosis not present

## 2024-08-25 NOTE — Telephone Encounter (Signed)
 Requested Prescriptions  Pending Prescriptions Disp Refills   levothyroxine  (SYNTHROID ) 88 MCG tablet [Pharmacy Med Name: LEVOTHYROXINE  SODIUM TABLET] 90 tablet 2    Sig: TAKE ONE TABLET (88 MCG TOTAL) BY MOUTH DAILY.     Endocrinology:  Hypothyroid Agents Passed - 08/25/2024  4:32 PM      Passed - TSH in normal range and within 360 days    TSH  Date Value Ref Range Status  05/22/2024 1.31 mIU/L Final    Comment:              Reference Range .           > or = 20 Years  0.40-4.50 .                Pregnancy Ranges           First trimester    0.26-2.66           Second trimester   0.55-2.73           Third trimester    0.43-2.91          Passed - Valid encounter within last 12 months    Recent Outpatient Visits           3 months ago Encounter for general adult medical examination with abnormal findings   [place] [facility] [place], Angeline ORN, NP   6 months ago Acute stress reaction    [facility] [place], Angeline ORN, [place]

## 2024-09-01 DIAGNOSIS — M25561 Pain in right knee: Secondary | ICD-10-CM | POA: Diagnosis not present

## 2024-09-01 DIAGNOSIS — R2689 Other abnormalities of gait and mobility: Secondary | ICD-10-CM | POA: Diagnosis not present

## 2024-09-01 DIAGNOSIS — M6281 Muscle weakness (generalized): Secondary | ICD-10-CM | POA: Diagnosis not present

## 2024-09-01 DIAGNOSIS — M25571 Pain in right ankle and joints of right foot: Secondary | ICD-10-CM | POA: Diagnosis not present

## 2024-09-03 DIAGNOSIS — R2689 Other abnormalities of gait and mobility: Secondary | ICD-10-CM | POA: Diagnosis not present

## 2024-09-03 DIAGNOSIS — M6281 Muscle weakness (generalized): Secondary | ICD-10-CM | POA: Diagnosis not present

## 2024-09-03 DIAGNOSIS — M25571 Pain in right ankle and joints of right foot: Secondary | ICD-10-CM | POA: Diagnosis not present

## 2024-09-03 DIAGNOSIS — M25561 Pain in right knee: Secondary | ICD-10-CM | POA: Diagnosis not present

## 2024-09-15 DIAGNOSIS — M6281 Muscle weakness (generalized): Secondary | ICD-10-CM | POA: Diagnosis not present

## 2024-09-15 DIAGNOSIS — M25571 Pain in right ankle and joints of right foot: Secondary | ICD-10-CM | POA: Diagnosis not present

## 2024-09-15 DIAGNOSIS — M25561 Pain in right knee: Secondary | ICD-10-CM | POA: Diagnosis not present

## 2024-09-15 DIAGNOSIS — R2689 Other abnormalities of gait and mobility: Secondary | ICD-10-CM | POA: Diagnosis not present

## 2024-09-17 DIAGNOSIS — M25571 Pain in right ankle and joints of right foot: Secondary | ICD-10-CM | POA: Diagnosis not present

## 2024-09-17 DIAGNOSIS — R2689 Other abnormalities of gait and mobility: Secondary | ICD-10-CM | POA: Diagnosis not present

## 2024-09-17 DIAGNOSIS — M25561 Pain in right knee: Secondary | ICD-10-CM | POA: Diagnosis not present

## 2024-09-17 DIAGNOSIS — M6281 Muscle weakness (generalized): Secondary | ICD-10-CM | POA: Diagnosis not present

## 2024-09-18 ENCOUNTER — Other Ambulatory Visit: Payer: Self-pay | Admitting: Internal Medicine

## 2024-09-18 DIAGNOSIS — K219 Gastro-esophageal reflux disease without esophagitis: Secondary | ICD-10-CM

## 2024-09-21 ENCOUNTER — Encounter: Payer: Self-pay | Admitting: Pulmonary Disease

## 2024-09-21 ENCOUNTER — Ambulatory Visit (INDEPENDENT_AMBULATORY_CARE_PROVIDER_SITE_OTHER): Admitting: Pulmonary Disease

## 2024-09-21 VITALS — BP 152/88 | HR 76 | Ht 64.0 in | Wt 218.2 lb

## 2024-09-21 DIAGNOSIS — G4733 Obstructive sleep apnea (adult) (pediatric): Secondary | ICD-10-CM | POA: Diagnosis not present

## 2024-09-21 NOTE — Telephone Encounter (Signed)
 Requested Prescriptions  Pending Prescriptions Disp Refills   omeprazole  (PRILOSEC) 20 MG capsule [Pharmacy Med Name: OMEPRAZOLE  20MG  CAPSULE DR] 90 capsule 0    Sig: TAKE 1 CAPSULE BY MOUTH ONCE DAILY     Gastroenterology: Proton Pump Inhibitors Passed - 09/21/2024  3:33 PM      Passed - Valid encounter within last 12 months    Recent Outpatient Visits           4 months ago Encounter for general adult medical examination with abnormal findings   [place] [facility] [place], Angeline ORN, NP   7 months ago Acute stress reaction   [place] [facility] [place], Angeline ORN, [place]

## 2024-09-21 NOTE — Patient Instructions (Signed)
 We will schedule you for a sleep study in the sleep lab -A split-night study where the first off overnight we will diagnosed the problem and the second half of the night there will put you on a CPAP to obtain the right pressure settings  Continue weight loss efforts  Regular exercises  Myofunctional exercises for the tongue do help  Call us  with significant concerns  Tentative follow-up in about 3 months

## 2024-09-21 NOTE — Progress Notes (Signed)
 Anne Mcguire    [id number]    11-09-1971  Primary Care Physician:Baity, Angeline ORN, NP  Referring Physician: Antonette Angeline ORN, NP [address] [place],  [place] [address]  Chief complaint:   Patient being seen for obstructive sleep apnea  HPI:  Patient with obstructive sleep apnea diagnosed in 2019 Noted to have very severe obstructive sleep apnea, titrated to CPAP AHI was 132 Titrated to a CPAP of 9 Was prescribed auto CPAP current settings 5-15  She did try CPAP for a while and then got away from using it Was recently evaluated in the emergency department and told about severe oxygen  desaturations when she fell asleep When she came back home, tried to use her CPAP and noted that she is not able to tolerated  At some point was refitted with a mask, has still not been able to use her CPAP  Usually tries to go to bed between 9 and 930 Wakes up between 6 and 630 Frequent awakenings Not rested in the morning Occasional headaches Dryness of her mouth is usual Some night sweats Memory is fair Focus depends on what she is doing  Dad does have sleep apnea and is on CPAP  She does report that she may have significant claustrophobia contributing to her inability to tolerate CPAP  Medical history significant for hypertension, migraines, GERD Anxiety/depression, insomnia, prediabetes  Does not smoke  Outpatient Encounter Medications as of 09/21/2024  Medication Sig   atorvastatin  (LIPITOR) 10 MG tablet TAKE ONE TABLET (10 MG TOTAL) BY MOUTH DAILY.   buPROPion  (WELLBUTRIN  XL) 300 MG 24 hr tablet TAKE ONE TABLET (300 MG TOTAL) BY MOUTH DAILY.   busPIRone  (BUSPAR ) 10 MG tablet TAKE ONE TABLET BY MOUTH THREE TIMES DAILY   FLUoxetine  (PROZAC ) 40 MG capsule TAKE 1 CAPSULE DAILY   hydrOXYzine  (ATARAX ) 10 MG tablet Take 1 tablet (10 mg total) by mouth daily as needed.   levothyroxine  (SYNTHROID ) 88 MCG tablet TAKE ONE TABLET (88 MCG TOTAL) BY MOUTH DAILY.    lisinopril -hydrochlorothiazide  (ZESTORETIC ) 20-12.5 MG tablet TAKE ONE TABLET BY MOUTH ONCE A DAY   LORazepam  (ATIVAN ) 0.5 MG tablet Take 1 tablet (0.5 mg total) by mouth 2 (two) times daily as needed for anxiety.   meloxicam  (MOBIC ) 15 MG tablet Take 1 tablet (15 mg total) by mouth daily.   omeprazole  (PRILOSEC) 20 MG capsule TAKE ONE CAPSULE BY MOUTH ONCE DAILY   silver  sulfADIAZINE  (SILVADENE ) 1 % cream Apply 1 Application topically daily.   No facility-administered encounter medications on file as of 09/21/2024.    Allergies as of 09/21/2024 - Review Complete 09/21/2024  Allergen Reaction Noted   Azithromycin Hives 02/19/2014    Past Medical History:  Diagnosis Date   Anxiety 06/01/2021   COVID 12/2020   headache, fever, stuffy nose, loss of taste and smell x few weeks all sx resolved except taste is altered   Depression 06/01/2021   Gastric ulcer    yrs ago no current problems per pt on 06-01-2021   GERD (gastroesophageal reflux disease) 06/01/2021   Hyperlipidemia 06/01/2021   Hypertension 06/01/2021   Hypothyroidism 06/01/2021   Menorrhagia 06/01/2021   Migraines 06/01/2021   regulae headaches also @ times   Nocturia 06/01/2021   Plantar fasciitis 06/01/2021   both feet right worse than left   Prediabetes    Sleep apnea 06/01/2021   cpap severe osa does not know cpap settings   SUI (stress urinary incontinence, female) 06/01/2021  Wears glasses 06/01/2021    Past Surgical History:  Procedure Laterality Date   ABDOMINAL HYSTERECTOMY     BLADDER SUSPENSION N/A 06/07/2021   Procedure: TRANSVAGINAL TAPE (TVT) PROCEDURE;  Surgeon: Dannielle Bouchard, DO;  Location: [facility];  Service: Gynecology;  Laterality: N/A;   CATARACT EXTRACTION, BILATERAL  07/11/2017   CHOLECYSTECTOMY     COLONOSCOPY WITH PROPOFOL  N/A 06/18/2022   Procedure: COLONOSCOPY WITH BIOPSY;  Surgeon: Jinny Carmine, MD;  Location: [facility];  Service: Endoscopy;  Laterality: N/A;    DILITATION & CURRETTAGE/HYSTROSCOPY WITH NOVASURE ABLATION N/A 01/10/2017   Procedure: DILATATION & CURETTAGE/HYSTEROSCOPY WITH NOVASURE ABLATION;  Surgeon: Bouchard Dannielle, DO;  Location:  [place];  Service: Gynecology;  Laterality: N/A;   ERCP N/A 03/15/2022   Procedure: ENDOSCOPIC RETROGRADE CHOLANGIOPANCREATOGRAPHY (ERCP);  Surgeon: Jinny Carmine, MD;  Location: [facility];  Service: Endoscopy;  Laterality: N/A;   EYE SURGERY     FOOT SURGERY Right 06/2023   KNEE DISLOCATION SURGERY Right 06/2023   LAPAROSCOPIC TUBAL LIGATION Bilateral 01/10/2017   Procedure: LAPAROSCOPIC TUBAL LIGATION with Filshie Clips;  Surgeon: Bouchard Dannielle, DO;  Location: [facility] ;  Service: Gynecology;  Laterality: Bilateral;   LAPAROSCOPIC VAGINAL HYSTERECTOMY WITH SALPINGECTOMY Bilateral 06/07/2021   Procedure: LAPAROSCOPIC ASSISTED VAGINAL HYSTERECTOMY WITH BILATERAL SALPINGECTOMY;  Surgeon: Dannielle Bouchard, DO;  Location: [facility];  Service: Gynecology;  Laterality: Bilateral;   POLYPECTOMY N/A 06/18/2022   Procedure: POLYPECTOMY;  Surgeon: Jinny Carmine, MD;  Location: [facility];  Service: Endoscopy;  Laterality: N/A;   TUBAL LIGATION     WISDOM TOOTH EXTRACTION  2013    Family History  Problem Relation Age of Onset   Mental illness Mother    Depression Mother    Arthritis Father    Hyperlipidemia Father    Hypertension Father    Diabetes Father    Arthritis Maternal Grandmother    Diabetes Maternal Grandmother    Arthritis Maternal Grandfather    Diabetes Maternal Grandfather    Arthritis Paternal Grandmother    Breast cancer Paternal Grandmother    Cancer Paternal Grandmother     Social History   Socioeconomic History   Marital status: Married    Spouse name: Not on file   Number of children: Not on file   Years of education: Not on file   Highest education level: Bachelor's degree (e.g., BA, AB, BS)  Occupational History    Not on file  Tobacco Use   Smoking status: Never    Passive exposure: Never   Smokeless tobacco: Never  Vaping Use   Vaping status: Never Used  Substance and Sexual Activity   Alcohol use: Yes    Comment: occasional   Drug use: No   Sexual activity: Yes  Other Topics Concern   Not on file  Social History Narrative   Not on file   Social Drivers of Health   Financial Resource Strain: Low Risk  (05/21/2024)   Overall Financial Resource Strain (CARDIA)    Difficulty of Paying Living Expenses: Not very hard  Food Insecurity: No Food Insecurity (05/21/2024)   Hunger Vital Sign    Worried About Running Out of Food in the Last Year: Never true    Ran Out of Food in the Last Year: Never true  Transportation Needs: No Transportation Needs (05/21/2024)   PRAPARE - Administrator, Civil Service (Medical): No    Lack of Transportation (Non-Medical): No  Physical Activity: Inactive (05/21/2024)  Exercise Vital Sign    Days of Exercise per Week: 0 days    Minutes of Exercise per Session: Not on file  Stress: Stress Concern Present (05/21/2024)   [employer] of Occupational Health - Occupational Stress Questionnaire    Feeling of Stress: Very much  Social Connections: Moderately Isolated (05/21/2024)   Social Connection and Isolation Panel    Frequency of Communication with Friends and Family: Never    Frequency of Social Gatherings with Friends and Family: Once a week    Attends Religious Services: 1 to 4 times per year    Active Member of [employer] or Organizations: No    Attends Engineer, structural: Not on file    Marital Status: Married  Catering manager Violence: Not on file    Review of Systems  Respiratory:  Positive for apnea.   Psychiatric/Behavioral:  Positive for sleep disturbance.     Vitals:   09/21/24 0956  BP: (!) 152/88  Pulse: 76  SpO2: 97%     Physical Exam Constitutional:      Appearance: She is obese.  HENT:     Head:  Normocephalic.     Mouth/Throat:     Mouth: Mucous membranes are moist.     Comments: Crowded oropharynx, Mallampati 3 Eyes:     General: No scleral icterus.    Pupils: Pupils are equal, round, and reactive to light.  Cardiovascular:     Rate and Rhythm: Normal rate and regular rhythm.     Heart sounds: No murmur heard.    No friction rub.  Pulmonary:     Effort: No respiratory distress.     Breath sounds: No stridor. No wheezing.  Musculoskeletal:     Cervical back: No rigidity or tenderness.  Neurological:     Mental Status: She is alert.  Psychiatric:        Mood and Affect: Mood normal.       09/21/2024    9:00 AM 07/24/2018    3:00 PM  Results of the Epworth flowsheet  Sitting and reading 0 3  Watching TV 3 2  Sitting, inactive in a public place (e.g. a theatre or a meeting) 2 3  As a passenger in a car for an hour without a break 3 3  Lying down to rest in the afternoon when circumstances permit 2 3  Sitting and talking to someone 1 1  Sitting quietly after a lunch without alcohol 0 2  In a car, while stopped for a few minutes in traffic 1 3  Total score 12 20    Data Reviewed: Previous sleep study from 2019 reviewed showing severe obstructive sleep apnea, successfully titrated CPAP therapy  Compliance data shows she is attempted to use the CPAP at least 3 times-10% of days over the last 30 days - Continues to have difficulty with CPAP Number of events 4.7   Assessment/Plan: Severe obstructive sleep apnea - Not well treated at present secondary to intolerance to CPAP  Claustrophobia  Last sleep study was in 2019 with severe obstructive sleep apnea - Pressure settings with auto CPAP may not be optimal at present - Mask issues also continue  We did talk about options regarding trying a new mask possibly a nasal mask in place of the AirFit F30 that she is currently using  Since last study was over 5 years ago Will benefit from having a new sleep study  performed, we will schedule as a split-night study  Other options of  treatment including an inspire device was also discussed with the patient, important to rule out presence of central sleep apnea if she is going to be a candidate for an inspire device  Encourage weight loss measures  Discussed myofunctional exercises  She does currently sleep with her head of bed elevated-an adjustable bed  Tentative follow-up in about 3 months  Encouraged to call with significant concerns  I spent 45 minutes dedicated to the care of this patient on the date of this encounter to include previsit review of records, face-to-face time with the patient discussing conditions above, post visit ordering of testing,ordering medications,independentlyinterpreting results, clinical documentation with electronic health record and communicated necessary findings to members of the patient's care team   Jennet Epley MD [facility] 09/21/2024, 10:22 AM  CC: Antonette Angeline ORN, NP

## 2024-09-22 DIAGNOSIS — M25561 Pain in right knee: Secondary | ICD-10-CM | POA: Diagnosis not present

## 2024-09-22 DIAGNOSIS — R2689 Other abnormalities of gait and mobility: Secondary | ICD-10-CM | POA: Diagnosis not present

## 2024-09-22 DIAGNOSIS — M25571 Pain in right ankle and joints of right foot: Secondary | ICD-10-CM | POA: Diagnosis not present

## 2024-09-22 DIAGNOSIS — M6281 Muscle weakness (generalized): Secondary | ICD-10-CM | POA: Diagnosis not present

## 2024-09-25 DIAGNOSIS — M25561 Pain in right knee: Secondary | ICD-10-CM | POA: Diagnosis not present

## 2024-09-25 DIAGNOSIS — M6281 Muscle weakness (generalized): Secondary | ICD-10-CM | POA: Diagnosis not present

## 2024-09-25 DIAGNOSIS — M25571 Pain in right ankle and joints of right foot: Secondary | ICD-10-CM | POA: Diagnosis not present

## 2024-09-25 DIAGNOSIS — R2689 Other abnormalities of gait and mobility: Secondary | ICD-10-CM | POA: Diagnosis not present

## 2024-09-29 DIAGNOSIS — M25561 Pain in right knee: Secondary | ICD-10-CM | POA: Diagnosis not present

## 2024-09-29 DIAGNOSIS — M25571 Pain in right ankle and joints of right foot: Secondary | ICD-10-CM | POA: Diagnosis not present

## 2024-09-29 DIAGNOSIS — R2689 Other abnormalities of gait and mobility: Secondary | ICD-10-CM | POA: Diagnosis not present

## 2024-09-29 DIAGNOSIS — M6281 Muscle weakness (generalized): Secondary | ICD-10-CM | POA: Diagnosis not present

## 2024-10-02 DIAGNOSIS — R2689 Other abnormalities of gait and mobility: Secondary | ICD-10-CM | POA: Diagnosis not present

## 2024-10-02 DIAGNOSIS — M25561 Pain in right knee: Secondary | ICD-10-CM | POA: Diagnosis not present

## 2024-10-02 DIAGNOSIS — M25571 Pain in right ankle and joints of right foot: Secondary | ICD-10-CM | POA: Diagnosis not present

## 2024-10-02 DIAGNOSIS — M6281 Muscle weakness (generalized): Secondary | ICD-10-CM | POA: Diagnosis not present

## 2024-10-05 DIAGNOSIS — M6281 Muscle weakness (generalized): Secondary | ICD-10-CM | POA: Diagnosis not present

## 2024-10-05 DIAGNOSIS — R2689 Other abnormalities of gait and mobility: Secondary | ICD-10-CM | POA: Diagnosis not present

## 2024-10-05 DIAGNOSIS — M25571 Pain in right ankle and joints of right foot: Secondary | ICD-10-CM | POA: Diagnosis not present

## 2024-10-05 DIAGNOSIS — M25561 Pain in right knee: Secondary | ICD-10-CM | POA: Diagnosis not present

## 2024-10-15 DIAGNOSIS — M6281 Muscle weakness (generalized): Secondary | ICD-10-CM | POA: Diagnosis not present

## 2024-10-15 DIAGNOSIS — M25561 Pain in right knee: Secondary | ICD-10-CM | POA: Diagnosis not present

## 2024-10-15 DIAGNOSIS — R2689 Other abnormalities of gait and mobility: Secondary | ICD-10-CM | POA: Diagnosis not present

## 2024-10-15 DIAGNOSIS — M25571 Pain in right ankle and joints of right foot: Secondary | ICD-10-CM | POA: Diagnosis not present

## 2024-10-20 DIAGNOSIS — M25571 Pain in right ankle and joints of right foot: Secondary | ICD-10-CM | POA: Diagnosis not present

## 2024-10-20 DIAGNOSIS — M6281 Muscle weakness (generalized): Secondary | ICD-10-CM | POA: Diagnosis not present

## 2024-10-20 DIAGNOSIS — M25561 Pain in right knee: Secondary | ICD-10-CM | POA: Diagnosis not present

## 2024-10-20 DIAGNOSIS — R2689 Other abnormalities of gait and mobility: Secondary | ICD-10-CM | POA: Diagnosis not present

## 2024-10-27 DIAGNOSIS — M25561 Pain in right knee: Secondary | ICD-10-CM | POA: Diagnosis not present

## 2024-10-27 DIAGNOSIS — M25571 Pain in right ankle and joints of right foot: Secondary | ICD-10-CM | POA: Diagnosis not present

## 2024-10-27 DIAGNOSIS — R2689 Other abnormalities of gait and mobility: Secondary | ICD-10-CM | POA: Diagnosis not present

## 2024-10-27 DIAGNOSIS — M6281 Muscle weakness (generalized): Secondary | ICD-10-CM | POA: Diagnosis not present

## 2024-11-03 DIAGNOSIS — M25571 Pain in right ankle and joints of right foot: Secondary | ICD-10-CM | POA: Diagnosis not present

## 2024-11-03 DIAGNOSIS — M25561 Pain in right knee: Secondary | ICD-10-CM | POA: Diagnosis not present

## 2024-11-03 DIAGNOSIS — R2689 Other abnormalities of gait and mobility: Secondary | ICD-10-CM | POA: Diagnosis not present

## 2024-11-03 DIAGNOSIS — M6281 Muscle weakness (generalized): Secondary | ICD-10-CM | POA: Diagnosis not present

## 2024-11-09 DIAGNOSIS — M25561 Pain in right knee: Secondary | ICD-10-CM | POA: Diagnosis not present

## 2024-11-09 DIAGNOSIS — M25571 Pain in right ankle and joints of right foot: Secondary | ICD-10-CM | POA: Diagnosis not present

## 2024-11-09 DIAGNOSIS — R2689 Other abnormalities of gait and mobility: Secondary | ICD-10-CM | POA: Diagnosis not present

## 2024-11-09 DIAGNOSIS — M6281 Muscle weakness (generalized): Secondary | ICD-10-CM | POA: Diagnosis not present

## 2024-11-18 ENCOUNTER — Other Ambulatory Visit: Payer: Self-pay | Admitting: Medical Genetics

## 2024-11-18 ENCOUNTER — Ambulatory Visit (HOSPITAL_BASED_OUTPATIENT_CLINIC_OR_DEPARTMENT_OTHER): Admitting: Pulmonary Disease

## 2024-11-18 ENCOUNTER — Encounter (HOSPITAL_BASED_OUTPATIENT_CLINIC_OR_DEPARTMENT_OTHER): Payer: Self-pay

## 2024-11-19 DIAGNOSIS — M25561 Pain in right knee: Secondary | ICD-10-CM | POA: Diagnosis not present

## 2024-11-19 DIAGNOSIS — R2689 Other abnormalities of gait and mobility: Secondary | ICD-10-CM | POA: Diagnosis not present

## 2024-11-19 DIAGNOSIS — M6281 Muscle weakness (generalized): Secondary | ICD-10-CM | POA: Diagnosis not present

## 2024-11-19 DIAGNOSIS — M25571 Pain in right ankle and joints of right foot: Secondary | ICD-10-CM | POA: Diagnosis not present

## 2024-11-23 NOTE — Progress Notes (Unsigned)
 Subjective:    Patient ID: Anne Mcguire, female    DOB: [birth year], 53 y.o.   MRN: [id number]  HPI  Patient presents to clinic today for 20-month follow-up of chronic conditions.  HTN: Her BP today is 144/88.  She is taking lisinopril  hct as prescribed.  ECG from 03/2022 reviewed.  Anxiety and depression (recurrent, moderate): Chronic, managed on fluoxetine , bupropion , buspirone ,  hydroxyzine  and lorazepam .  She does feel like her anxiety and depression have been worse lately secondary to stress.  She is not currently seeing a therapist.  She denies SI/HI.  Insomnia: She has difficulty falling asleep.  She is not taking any medications for this at this time.  Sleep study from 08/2018 reviewed.  OSA: She has a pending sleep study. She is not currently wearing a CPAP machine. Sleep study from 08/2018 reviewed.   GERD: History of PUD.  Triggered by spicy foods, sausage and tomato-based foods.  She takes omeprazole  as needed.  There is no upper GI on file.  HLD: Her last LDL was 62, triglycerides 76, 05/2024.  She denies myalgias on atorvastatin .  She tries to consume a low-fat diet.  Hypothyroidism: She denies any issues on her current dose of levothyroxine  (only taking a half).  She does not follow with endocrinology.  Migraines: These occur rarely.  She takes excedrin migraine as needed with good relief of symptoms.  She does not follow with neurology.  Prediabetes: Her last A1c was 6%, 05/2024.  She is no longer taking semaglutide .  She does not check her sugars.  Chronic right shoulder pain: This seems to have improved. She is no longer taking meloxicam .  Xray from 10/2022 reviewed. She does not follow but with orthopedics.  Review of Systems     Past Medical History:  Diagnosis Date   Anxiety 06/01/2021   COVID 12/2020   headache, fever, stuffy nose, loss of taste and smell x few weeks all sx resolved except taste is altered   Depression 06/01/2021   Gastric ulcer    yrs ago  no current problems per pt on 06-01-2021   GERD (gastroesophageal reflux disease) 06/01/2021   Hyperlipidemia 06/01/2021   Hypertension 06/01/2021   Hypothyroidism 06/01/2021   Menorrhagia 06/01/2021   Migraines 06/01/2021   regulae headaches also @ times   Nocturia 06/01/2021   Plantar fasciitis 06/01/2021   both feet right worse than left   Prediabetes    Sleep apnea 06/01/2021   cpap severe osa does not know cpap settings   SUI (stress urinary incontinence, female) 06/01/2021   Wears glasses 06/01/2021    Current Outpatient Medications  Medication Sig Dispense Refill   atorvastatin  (LIPITOR) 10 MG tablet TAKE ONE TABLET (10 MG TOTAL) BY MOUTH DAILY. 90 tablet 3   buPROPion  (WELLBUTRIN  XL) 300 MG 24 hr tablet TAKE ONE TABLET (300 MG TOTAL) BY MOUTH DAILY. 90 tablet 1   busPIRone  (BUSPAR ) 10 MG tablet TAKE ONE TABLET BY MOUTH THREE TIMES DAILY 270 tablet 1   FLUoxetine  (PROZAC ) 40 MG capsule TAKE 1 CAPSULE DAILY 90 capsule 1   hydrOXYzine  (ATARAX ) 10 MG tablet Take 1 tablet (10 mg total) by mouth daily as needed. 90 tablet 0   levothyroxine  (SYNTHROID ) 88 MCG tablet TAKE ONE TABLET (88 MCG TOTAL) BY MOUTH DAILY. 90 tablet 2   lisinopril -hydrochlorothiazide  (ZESTORETIC ) 20-12.5 MG tablet TAKE ONE TABLET BY MOUTH ONCE A DAY 90 tablet 1   LORazepam  (ATIVAN ) 0.5 MG tablet Take 1 tablet (0.5 mg total) by mouth  2 (two) times daily as needed for anxiety. 10 tablet 0   meloxicam  (MOBIC ) 15 MG tablet Take 1 tablet (15 mg total) by mouth daily. 60 tablet 1   omeprazole  (PRILOSEC) 20 MG capsule TAKE 1 CAPSULE BY MOUTH ONCE DAILY 90 capsule 0   silver  sulfADIAZINE  (SILVADENE ) 1 % cream Apply 1 Application topically daily. 50 g 0   No current facility-administered medications for this visit.    Allergies  Allergen Reactions   Azithromycin Hives    Family History  Problem Relation Age of Onset   Mental illness Mother    Depression Mother    Arthritis Father    Hyperlipidemia Father     Hypertension Father    Diabetes Father    Arthritis Maternal Grandmother    Diabetes Maternal Grandmother    Arthritis Maternal Grandfather    Diabetes Maternal Grandfather    Arthritis Paternal Grandmother    Breast cancer Paternal Grandmother    Cancer Paternal Grandmother     Social History   Socioeconomic History   Marital status: Married    Spouse name: Not on file   Number of children: Not on file   Years of education: Not on file   Highest education level: Bachelor's degree (e.g., BA, AB, BS)  Occupational History   Not on file  Tobacco Use   Smoking status: Never    Passive exposure: Never   Smokeless tobacco: Never  Vaping Use   Vaping status: Never Used  Substance and Sexual Activity   Alcohol use: Yes    Comment: occasional   Drug use: No   Sexual activity: Yes  Other Topics Concern   Not on file  Social History Narrative   Not on file   Social Drivers of Health   Tobacco Use: Low Risk (09/21/2024)   Patient History    Smoking Tobacco Use: Never    Smokeless Tobacco Use: Never    Passive Exposure: Never  Financial Resource Strain: Low Risk (05/21/2024)   Overall Financial Resource Strain (CARDIA)    Difficulty of Paying Living Expenses: Not very hard  Food Insecurity: No Food Insecurity (05/21/2024)   Epic    Worried About Programme Researcher, Broadcasting/film/video in the Last Year: Never true    Ran Out of Food in the Last Year: Never true  Transportation Needs: No Transportation Needs (05/21/2024)   Epic    Lack of Transportation (Medical): No    Lack of Transportation (Non-Medical): No  Physical Activity: Inactive (05/21/2024)   Exercise Vital Sign    Days of Exercise per Week: 0 days    Minutes of Exercise per Session: Not on file  Stress: Stress Concern Present (05/21/2024)   [employer] of Occupational Health - Occupational Stress Questionnaire    Feeling of Stress: Very much  Social Connections: Moderately Isolated (05/21/2024)   Social Connection and  Isolation Panel    Frequency of Communication with Friends and Family: Never    Frequency of Social Gatherings with Friends and Family: Once a week    Attends Religious Services: 1 to 4 times per year    Active Member of [employer] or Organizations: No    Attends Banker Meetings: Not on file    Marital Status: Married  Intimate Partner Violence: Not on file  Depression (PHQ2-9): Medium Risk (05/22/2024)   Depression (PHQ2-9)    PHQ-2 Score: 8  Alcohol Screen: Low Risk (05/21/2024)   Alcohol Screen    Last Alcohol Screening Score (AUDIT): 1  Housing: Unknown (05/21/2024)   Epic    Unable to Pay for Housing in the Last Year: No    Number of Times Moved in the Last Year: Not on file    Homeless in the Last Year: No  Utilities: Not on file  Health Literacy: Not on file     Constitutional: Patient reports intermittent headaches.  Denies fever, malaise, fatigue or abrupt weight changes.  HEENT: Denies eye pain, eye redness, ear pain, ringing in the ears, wax buildup, runny nose, nasal congestion, bloody nose, or sore throat. Respiratory: Denies difficulty breathing, shortness of breath, cough or sputum production.   Cardiovascular: Denies chest pain, chest tightness, palpitations or swelling in the hands or feet.  Gastrointestinal: Denies abdominal pain, bloating, constipation, diarrhea or blood in the stool.  GU: Denies urgency, frequency, pain with urination, burning sensation, blood in urine, odor or discharge. Musculoskeletal: Patient reports intermittent right shoulder pain.  Denies decrease in range of motion, difficulty with gait, muscle pain or joint swelling.  Skin: Denies redness, rashes, lesions or ulcercations.  Neurological: Patient reports insomnia.  Denies dizziness, difficulty with memory, difficulty with speech or problems with balance and coordination.  Psych: Patient has a history of anxiety and depression.  Denies SI/HI.  No other specific complaints in a  complete review of systems (except as listed in HPI above).  Objective:   Physical Exam  BP (!) 140/80   Ht 5' 4 (1.626 m)   Wt 222 lb 12.8 oz (101.1 kg)   LMP 05/15/2021   BMI 38.24 kg/m    Wt Readings from Last 3 Encounters:  09/21/24 218 lb 3.2 oz (99 kg)  08/04/24 209 lb 3.2 oz (94.9 kg)  05/22/24 212 lb 9.6 oz (96.4 kg)    General: Appears her stated age, obese, in NAD. Skin: Warm, dry and intact.   HEENT: Head: normal shape and size; Eyes: sclera white, no icterus, conjunctiva pink, PERRLA and EOMs intact;  Neck:  Neck supple, trachea midline. No masses, lumps or thyromegaly present.  Cardiovascular: Normal rate and rhythm. S1,S2 noted.  No murmur, rubs or gallops noted. No JVD or BLE edema. No carotid bruits noted. Pulmonary/Chest: Normal effort and positive vesicular breath sounds. No respiratory distress. No wheezes, rales or ronchi noted.  Abdomen: Normal bowel sounds.  Musculoskeletal: No difficulty with gait.  Neurological: Alert and oriented.  Psychiatric: Mood and affect mildly flat. Behavior is normal. Judgment and thought content normal.   BMET    Component Value Date/Time   NA 140 05/22/2024 0832   K 4.3 05/22/2024 0832   CL 104 05/22/2024 0832   CO2 31 05/22/2024 0832   GLUCOSE 100 (H) 05/22/2024 0832   BUN 16 05/22/2024 0832   CREATININE 0.71 05/22/2024 0832   CALCIUM  8.9 05/22/2024 0832   GFRNONAA >60 03/14/2022 1320    Lipid Panel     Component Value Date/Time   CHOL 149 05/22/2024 0832   TRIG 76 05/22/2024 0832   HDL 71 05/22/2024 0832   CHOLHDL 2.1 05/22/2024 0832   VLDL 21.4 03/25/2020 0749   LDLCALC 62 05/22/2024 0832    CBC    Component Value Date/Time   WBC 7.6 05/22/2024 0832   RBC 4.21 05/22/2024 0832   HGB 13.3 05/22/2024 0832   HCT 40.8 05/22/2024 0832   PLT 164 05/22/2024 0832   MCV 96.9 05/22/2024 0832   MCH 31.6 05/22/2024 0832   MCHC 32.6 05/22/2024 0832   RDW 13.0 05/22/2024 0832   LYMPHSABS 1.5  03/14/2022 0002    MONOABS 0.7 03/14/2022 0002   EOSABS 0.1 03/14/2022 0002   BASOSABS 0.0 03/14/2022 0002    Hgb A1C Lab Results  Component Value Date   HGBA1C 6.0 (H) 05/22/2024           Assessment & Plan:    RTC in 2 weeks, follow-up HTN, 6 months for your annual exam Angeline Laura, NP

## 2024-11-24 ENCOUNTER — Ambulatory Visit: Admitting: Internal Medicine

## 2024-11-24 ENCOUNTER — Encounter: Payer: Self-pay | Admitting: Internal Medicine

## 2024-11-24 VITALS — BP 140/80 | Ht 64.0 in | Wt 222.8 lb

## 2024-11-24 DIAGNOSIS — R7303 Prediabetes: Secondary | ICD-10-CM | POA: Diagnosis not present

## 2024-11-24 DIAGNOSIS — E7800 Pure hypercholesterolemia, unspecified: Secondary | ICD-10-CM | POA: Diagnosis not present

## 2024-11-24 DIAGNOSIS — K219 Gastro-esophageal reflux disease without esophagitis: Secondary | ICD-10-CM

## 2024-11-24 DIAGNOSIS — E039 Hypothyroidism, unspecified: Secondary | ICD-10-CM | POA: Diagnosis not present

## 2024-11-24 DIAGNOSIS — Z23 Encounter for immunization: Secondary | ICD-10-CM

## 2024-11-24 DIAGNOSIS — F331 Major depressive disorder, recurrent, moderate: Secondary | ICD-10-CM | POA: Insufficient documentation

## 2024-11-24 DIAGNOSIS — G43C1 Periodic headache syndromes in child or adult, intractable: Secondary | ICD-10-CM

## 2024-11-24 DIAGNOSIS — G4730 Sleep apnea, unspecified: Secondary | ICD-10-CM

## 2024-11-24 DIAGNOSIS — G8929 Other chronic pain: Secondary | ICD-10-CM

## 2024-11-24 DIAGNOSIS — F5101 Primary insomnia: Secondary | ICD-10-CM

## 2024-11-24 DIAGNOSIS — I10 Essential (primary) hypertension: Secondary | ICD-10-CM

## 2024-11-24 DIAGNOSIS — F411 Generalized anxiety disorder: Secondary | ICD-10-CM

## 2024-11-24 MED ORDER — FLUOXETINE HCL 20 MG PO CAPS: 20.0000 mg | ORAL_CAPSULE | Freq: Every day | ORAL | 1 refills | Status: AC

## 2024-11-24 MED ORDER — LISINOPRIL-HYDROCHLOROTHIAZIDE 20-12.5 MG PO TABS: 2.0000 | ORAL_TABLET | Freq: Every day | ORAL | Status: AC

## 2024-11-24 MED ORDER — OMEPRAZOLE 20 MG PO CPDR: 20.0000 mg | DELAYED_RELEASE_CAPSULE | Freq: Every day | ORAL | 1 refills | Status: AC

## 2024-11-24 NOTE — Assessment & Plan Note (Signed)
 Complicated by obesity A1c today Encouraged low-carb diet and exercise for weight loss

## 2024-11-24 NOTE — Assessment & Plan Note (Signed)
 Complicated by obesity Avoid foods that trigger reflux Encourage weight loss as this can reduce reflux symptoms Continue omeprazole  20 mg daily as needed

## 2024-11-24 NOTE — Assessment & Plan Note (Signed)
 Deteriorated Continue bupropion  300 mg XL daily, buspirone  10 mg 3 times daily as needed and hydroxyzine  milligrams daily Will increase fluoxetine  to 40 mg daily Support offered

## 2024-11-24 NOTE — Assessment & Plan Note (Signed)
 Complicated by obesity Will increase lisinopril  HCT to 2 tabs of 20-12.5 mg daily (40-25 mg) Reinforced DASH diet and exercise for weight loss C-Met today

## 2024-11-24 NOTE — Assessment & Plan Note (Addendum)
 TSH and free T4 today Continue levothyroxine  44 mcg daily (half of 88 mcg tablet), will adjust if needed based on labs

## 2024-11-24 NOTE — Patient Instructions (Signed)

## 2024-11-24 NOTE — Assessment & Plan Note (Signed)
 Encouraged regular stretching Okay to take ibuprofen  OTC as needed

## 2024-11-24 NOTE — Assessment & Plan Note (Signed)
 Currently not an issue We will monitor

## 2024-11-24 NOTE — Assessment & Plan Note (Signed)
 Complicated by obesity Encouraged weight loss as this can help reduce sleep apnea symptoms She has an upcoming sleep study pending Discussed the use of tirzepatide (zepbound) for management pending sleep study results

## 2024-11-24 NOTE — Assessment & Plan Note (Signed)
 Complicated by obesity C-Met and lipid profile today Encouraged her to consume low-fat diet Continue atorvastatin  10 mg daily

## 2024-11-24 NOTE — Assessment & Plan Note (Signed)
 Try to identify and avoid triggers Continue excedrin migraine as needed

## 2024-11-25 ENCOUNTER — Ambulatory Visit: Payer: Self-pay | Admitting: Internal Medicine

## 2024-11-25 DIAGNOSIS — M6281 Muscle weakness (generalized): Secondary | ICD-10-CM | POA: Diagnosis not present

## 2024-11-25 DIAGNOSIS — M25561 Pain in right knee: Secondary | ICD-10-CM | POA: Diagnosis not present

## 2024-11-25 DIAGNOSIS — R2689 Other abnormalities of gait and mobility: Secondary | ICD-10-CM | POA: Diagnosis not present

## 2024-11-25 DIAGNOSIS — Z13820 Encounter for screening for osteoporosis: Secondary | ICD-10-CM | POA: Diagnosis not present

## 2024-11-25 DIAGNOSIS — M25571 Pain in right ankle and joints of right foot: Secondary | ICD-10-CM | POA: Diagnosis not present

## 2024-11-25 LAB — CBC
HCT: 40.3 % (ref 35.9–46.0)
Hemoglobin: 12.8 g/dL (ref 11.7–15.5)
MCH: 30.1 pg (ref 27.0–33.0)
MCHC: 31.8 g/dL (ref 31.6–35.4)
MCV: 94.8 fL (ref 81.4–101.7)
MPV: 11.6 fL (ref 7.5–12.5)
Platelets: 182 Thousand/uL (ref 140–400)
RBC: 4.25 Million/uL (ref 3.80–5.10)
RDW: 12.5 % (ref 11.0–15.0)
WBC: 6.1 Thousand/uL (ref 3.8–10.8)

## 2024-11-25 LAB — COMPREHENSIVE METABOLIC PANEL WITH GFR
AG Ratio: 1.7 (calc) (ref 1.0–2.5)
ALT: 11 U/L (ref 6–29)
AST: 12 U/L (ref 10–35)
Albumin: 4.2 g/dL (ref 3.6–5.1)
Alkaline phosphatase (APISO): 54 U/L (ref 37–153)
BUN: 17 mg/dL (ref 7–25)
CO2: 32 mmol/L (ref 20–32)
Calcium: 9.4 mg/dL (ref 8.6–10.4)
Chloride: 103 mmol/L (ref 98–110)
Creat: 0.77 mg/dL (ref 0.50–1.03)
Globulin: 2.5 g/dL (ref 1.9–3.7)
Glucose, Bld: 95 mg/dL (ref 65–99)
Potassium: 4.3 mmol/L (ref 3.5–5.3)
Sodium: 141 mmol/L (ref 135–146)
Total Bilirubin: 0.5 mg/dL (ref 0.2–1.2)
Total Protein: 6.7 g/dL (ref 6.1–8.1)
eGFR: 93 mL/min/1.73m2 (ref >=60)

## 2024-11-25 LAB — HEMOGLOBIN A1C
Hgb A1c MFr Bld: 5.6 % (ref <5.7)
Mean Plasma Glucose: 114 mg/dL
eAG (mmol/L): 6.3 mmol/L

## 2024-11-25 LAB — LIPID PANEL
Cholesterol: 148 mg/dL (ref <200)
HDL: 57 mg/dL (ref >=50)
LDL Cholesterol (Calc): 72 mg/dL
Non-HDL Cholesterol (Calc): 91 mg/dL (ref <130)
Total CHOL/HDL Ratio: 2.6 (calc) (ref <5.0)
Triglycerides: 103 mg/dL (ref <150)

## 2024-11-25 LAB — T3: T3, Total: 98 ng/dL (ref 76–181)

## 2024-11-25 LAB — T4, FREE: Free T4: 1.3 ng/dL (ref 0.8–1.8)

## 2024-11-27 DIAGNOSIS — M25571 Pain in right ankle and joints of right foot: Secondary | ICD-10-CM | POA: Diagnosis not present

## 2024-11-27 DIAGNOSIS — M25561 Pain in right knee: Secondary | ICD-10-CM | POA: Diagnosis not present

## 2024-11-27 DIAGNOSIS — M6281 Muscle weakness (generalized): Secondary | ICD-10-CM | POA: Diagnosis not present

## 2024-11-27 DIAGNOSIS — R2689 Other abnormalities of gait and mobility: Secondary | ICD-10-CM | POA: Diagnosis not present

## 2024-12-01 DIAGNOSIS — R2689 Other abnormalities of gait and mobility: Secondary | ICD-10-CM | POA: Diagnosis not present

## 2024-12-01 DIAGNOSIS — M25571 Pain in right ankle and joints of right foot: Secondary | ICD-10-CM | POA: Diagnosis not present

## 2024-12-01 DIAGNOSIS — M6281 Muscle weakness (generalized): Secondary | ICD-10-CM | POA: Diagnosis not present

## 2024-12-01 DIAGNOSIS — M25561 Pain in right knee: Secondary | ICD-10-CM | POA: Diagnosis not present

## 2024-12-08 DIAGNOSIS — M25561 Pain in right knee: Secondary | ICD-10-CM | POA: Diagnosis not present

## 2024-12-08 DIAGNOSIS — R2689 Other abnormalities of gait and mobility: Secondary | ICD-10-CM | POA: Diagnosis not present

## 2024-12-08 DIAGNOSIS — M6281 Muscle weakness (generalized): Secondary | ICD-10-CM | POA: Diagnosis not present

## 2024-12-08 DIAGNOSIS — M25571 Pain in right ankle and joints of right foot: Secondary | ICD-10-CM | POA: Diagnosis not present

## 2024-12-09 ENCOUNTER — Encounter: Payer: Self-pay | Admitting: Podiatry

## 2024-12-15 ENCOUNTER — Encounter: Payer: Self-pay | Admitting: Internal Medicine

## 2024-12-15 ENCOUNTER — Ambulatory Visit: Admitting: Internal Medicine

## 2024-12-15 VITALS — BP 130/82 | Ht 64.0 in | Wt 223.6 lb

## 2024-12-15 DIAGNOSIS — M79671 Pain in right foot: Secondary | ICD-10-CM | POA: Diagnosis not present

## 2024-12-15 DIAGNOSIS — G8929 Other chronic pain: Secondary | ICD-10-CM

## 2024-12-15 DIAGNOSIS — Z6838 Body mass index (BMI) 38.0-38.9, adult: Secondary | ICD-10-CM | POA: Diagnosis not present

## 2024-12-15 DIAGNOSIS — G4730 Sleep apnea, unspecified: Secondary | ICD-10-CM | POA: Diagnosis not present

## 2024-12-15 DIAGNOSIS — E66812 Obesity, class 2: Secondary | ICD-10-CM | POA: Diagnosis not present

## 2024-12-15 DIAGNOSIS — I10 Essential (primary) hypertension: Secondary | ICD-10-CM

## 2024-12-15 DIAGNOSIS — F5101 Primary insomnia: Secondary | ICD-10-CM

## 2024-12-15 MED ORDER — TRAZODONE HCL 50 MG PO TABS: 25.0000 mg | ORAL_TABLET | Freq: Every evening | ORAL | 1 refills | Status: AC | PRN

## 2024-12-15 NOTE — Progress Notes (Signed)
 "  Subjective:    Patient ID: Anne Mcguire, female    DOB: 05/15/1971, 54 y.o.   MRN: [id number]  HPI  Discussed the use of AI scribe software for clinical note transcription with the patient, who gave verbal consent to proceed.  Anne Mcguire is a 54 year old female with hypertension who presents for a blood pressure follow-up.  She is currently taking lisinopril  HCT at a total dose of 40-25 mg. She adheres to the medication regimen and feels less anxious, but still experiences low energy levels, which she attributes to poor sleep quality.  Her BP today is 130/82.  She has difficulty staying asleep, which she suspects may be related to her diagnosed sleep apnea. She was supposed to undergo another sleep study but had to cancel due to illness and has not rescheduled it yet. She previously used a CPAP machine but stopped due to discomfort with the enclosed mask. She has tried Ambien in the past but is not willing to use it again.  She recently had a bone density test done in December 2024 at [facility]. She inquires about taking calcium , vitamin D , and B12 supplements, noting that multivitamins make her sick.  She had surgery for plantar fasciitis on her right foot and has been undergoing physical therapy for 6-7 months.  She reports persistent pain especially with walking long distances.  She initially received a temporary handicap sticker for her foot but is inquiring about the possibility of obtaining a permanent one.        Review of Systems     Past Medical History:  Diagnosis Date   Anxiety 06/01/2021   COVID 12/2020   headache, fever, stuffy nose, loss of taste and smell x few weeks all sx resolved except taste is altered   Depression 06/01/2021   Gastric ulcer    yrs ago no current problems per pt on 06-01-2021   GERD (gastroesophageal reflux disease) 06/01/2021   Hyperlipidemia 06/01/2021   Hypertension 06/01/2021   Hypothyroidism 06/01/2021   Menorrhagia  06/01/2021   Migraines 06/01/2021   regulae headaches also @ times   Nocturia 06/01/2021   Plantar fasciitis 06/01/2021   both feet right worse than left   Prediabetes    Sleep apnea 06/01/2021   cpap severe osa does not know cpap settings   SUI (stress urinary incontinence, female) 06/01/2021   Wears glasses 06/01/2021    Current Outpatient Medications  Medication Sig Dispense Refill   atorvastatin  (LIPITOR) 10 MG tablet TAKE ONE TABLET (10 MG TOTAL) BY MOUTH DAILY. 90 tablet 3   buPROPion  (WELLBUTRIN  XL) 300 MG 24 hr tablet TAKE ONE TABLET (300 MG TOTAL) BY MOUTH DAILY. 90 tablet 1   busPIRone  (BUSPAR ) 10 MG tablet TAKE ONE TABLET BY MOUTH THREE TIMES DAILY 270 tablet 1   FLUoxetine  (PROZAC ) 20 MG capsule Take 1 capsule (20 mg total) by mouth daily. 90 capsule 1   FLUoxetine  (PROZAC ) 40 MG capsule TAKE 1 CAPSULE DAILY 90 capsule 1   hydrOXYzine  (ATARAX ) 10 MG tablet Take 1 tablet (10 mg total) by mouth daily as needed. 90 tablet 0   levothyroxine  (SYNTHROID ) 88 MCG tablet TAKE ONE TABLET (88 MCG TOTAL) BY MOUTH DAILY. (Patient taking differently: 44 mcg.) 90 tablet 2   lisinopril -hydrochlorothiazide  (ZESTORETIC ) 20-12.5 MG tablet Take 2 tablets by mouth daily.     LORazepam  (ATIVAN ) 0.5 MG tablet Take 1 tablet (0.5 mg total) by mouth 2 (two) times daily as needed for anxiety. 10  tablet 0   omeprazole  (PRILOSEC) 20 MG capsule Take 1 capsule (20 mg total) by mouth daily. 90 capsule 1   No current facility-administered medications for this visit.    Allergies  Allergen Reactions   Azithromycin Hives    Family History  Problem Relation Age of Onset   Mental illness Mother    Depression Mother    Arthritis Father    Hyperlipidemia Father    Hypertension Father    Diabetes Father    Arthritis Maternal Grandmother    Diabetes Maternal Grandmother    Arthritis Maternal Grandfather    Diabetes Maternal Grandfather    Arthritis Paternal Grandmother    Breast cancer Paternal  Grandmother    Cancer Paternal Grandmother     Social History   Socioeconomic History   Marital status: Married    Spouse name: Not on file   Number of children: Not on file   Years of education: Not on file   Highest education level: Bachelor's degree (e.g., BA, AB, BS)  Occupational History   Not on file  Tobacco Use   Smoking status: Never    Passive exposure: Never   Smokeless tobacco: Never  Vaping Use   Vaping status: Never Used  Substance and Sexual Activity   Alcohol use: Yes    Comment: occasional   Drug use: No   Sexual activity: Yes  Other Topics Concern   Not on file  Social History Narrative   Not on file   Social Drivers of Health   Tobacco Use: Low Risk (11/24/2024)   Patient History    Smoking Tobacco Use: Never    Smokeless Tobacco Use: Never    Passive Exposure: Never  Financial Resource Strain: Low Risk (05/21/2024)   Overall Financial Resource Strain (CARDIA)    Difficulty of Paying Living Expenses: Not very hard  Food Insecurity: No Food Insecurity (05/21/2024)   Epic    Worried About Programme Researcher, Broadcasting/film/video in the Last Year: Never true    Ran Out of Food in the Last Year: Never true  Transportation Needs: No Transportation Needs (05/21/2024)   Epic    Lack of Transportation (Medical): No    Lack of Transportation (Non-Medical): No  Physical Activity: Inactive (05/21/2024)   Exercise Vital Sign    Days of Exercise per Week: 0 days    Minutes of Exercise per Session: Not on file  Stress: Stress Concern Present (05/21/2024)   [employer] of Occupational Health - Occupational Stress Questionnaire    Feeling of Stress: Very much  Social Connections: Moderately Isolated (05/21/2024)   Social Connection and Isolation Panel    Frequency of Communication with Friends and Family: Never    Frequency of Social Gatherings with Friends and Family: Once a week    Attends Religious Services: 1 to 4 times per year    Active Member of [employer] or  Organizations: No    Attends Engineer, Structural: Not on file    Marital Status: Married  Catering Manager Violence: Not on file  Depression (PHQ2-9): High Risk (11/24/2024)   Depression (PHQ2-9)    PHQ-2 Score: 18  Alcohol Screen: Low Risk (05/21/2024)   Alcohol Screen    Last Alcohol Screening Score (AUDIT): 1  Housing: Unknown (05/21/2024)   Epic    Unable to Pay for Housing in the Last Year: No    Number of Times Moved in the Last Year: Not on file    Homeless in the Last Year: No  Utilities: Not on file  Health Literacy: Not on file     Constitutional: Patient reports intermittent headaches.  Denies fever, malaise, fatigue or abrupt weight changes.  HEENT: Denies eye pain, eye redness, ear pain, ringing in the ears, wax buildup, runny nose, nasal congestion, bloody nose, or sore throat. Respiratory: Denies difficulty breathing, shortness of breath, cough or sputum production.   Cardiovascular: Denies chest pain, chest tightness, palpitations or swelling in the hands or feet.  Gastrointestinal: Denies abdominal pain, bloating, constipation, diarrhea or blood in the stool.  GU: Denies urgency, frequency, pain with urination, burning sensation, blood in urine, odor or discharge. Musculoskeletal: Patient reports intermittent chronic right foot pain.  Denies decrease in range of motion, difficulty with gait, muscle pain or joint swelling.  Skin: Denies redness, rashes, lesions or ulcercations.  Neurological: Patient reports insomnia.  Denies dizziness, difficulty with memory, difficulty with speech or problems with balance and coordination.  Psych: Patient has a history of anxiety and depression.  Denies SI/HI.  No other specific complaints in a complete review of systems (except as listed in HPI above).  Objective:   Physical Exam  BP 130/82 (BP Location: Right Arm, Patient Position: Sitting, Cuff Size: Large)   Ht 5' 4 (1.626 m)   Wt 223 lb 9.6 oz (101.4 kg)   LMP  05/15/2021   BMI 38.38 kg/m     Wt Readings from Last 3 Encounters:  11/24/24 222 lb 12.8 oz (101.1 kg)  09/21/24 218 lb 3.2 oz (99 kg)  08/04/24 209 lb 3.2 oz (94.9 kg)    General: Appears her stated age, obese, in NAD. Skin: Warm, dry and intact.   HEENT: Head: normal shape and size; Eyes: sclera white, no icterus, conjunctiva pink, PERRLA and EOMs intact;  Cardiovascular: Normal rate and rhythm.  Pulmonary/Chest: Normal effort. No respiratory distress. Musculoskeletal: Gait slow and steady without device. Neurological: Alert and oriented.   BMET    Component Value Date/Time   NA 141 11/24/2024 0848   K 4.3 11/24/2024 0848   CL 103 11/24/2024 0848   CO2 32 11/24/2024 0848   GLUCOSE 95 11/24/2024 0848   BUN 17 11/24/2024 0848   CREATININE 0.77 11/24/2024 0848   CALCIUM  9.4 11/24/2024 0848   GFRNONAA >60 03/14/2022 1320    Lipid Panel     Component Value Date/Time   CHOL 148 11/24/2024 0848   TRIG 103 11/24/2024 0848   HDL 57 11/24/2024 0848   CHOLHDL 2.6 11/24/2024 0848   VLDL 21.4 03/25/2020 0749   LDLCALC 72 11/24/2024 0848    CBC    Component Value Date/Time   WBC 6.1 11/24/2024 0848   RBC 4.25 11/24/2024 0848   HGB 12.8 11/24/2024 0848   HCT 40.3 11/24/2024 0848   PLT 182 11/24/2024 0848   MCV 94.8 11/24/2024 0848   MCH 30.1 11/24/2024 0848   MCHC 31.8 11/24/2024 0848   RDW 12.5 11/24/2024 0848   LYMPHSABS 1.5 03/14/2022 0002   MONOABS 0.7 03/14/2022 0002   EOSABS 0.1 03/14/2022 0002   BASOSABS 0.0 03/14/2022 0002    Hgb A1C Lab Results  Component Value Date   HGBA1C 5.6 11/24/2024           Assessment & Plan:   Assessment and Plan    Essential hypertension Hypertension managed with lisinopril  HCT. Current dose is 40-25 mg, taken as two 20-12.5 mg tablets. No need for refill at this time. - Continue lisinopril  HCT 40-25 mg. - Reinforced DASH diet and exercise  for weight loss  Sleep apnea Diagnosed with sleep apnea. Difficulty  using CPAP due to mask issues. Previous sleep study was canceled due to illness and needs rescheduling. - Reschedule sleep study to reassess CPAP mask and settings. - Encourage weight loss as this can help reduce sleep apnea symptoms  Primary insomnia Difficulty staying asleep. Previous use of Ambien was not well-tolerated. Trazodone  considered as an alternative treatment option. - Prescribed trazodone  25-50 mg, start with half a tablet and increase to a whole tablet if needed.  Bone density test completed in December 2024. Recommendations include calcium  and vitamin D  supplementation. No interaction with levothyroxine . B12 supplementation discussed, with recommendation for sublingual form due to absorption issues. - Attach bone density report to MyChart. - Take calcium  and vitamin D  supplements in the morning. - Use sublingual B12 supplements.     Chronic right foot pain status post plantar fasciotomy - Currently undergoing PT for the same - Discussed possibility of trying the good feet store to see if shoes or inserts can improve her symptoms  RTC in 5 months for your annual exam Angeline Laura, NP   "

## 2024-12-15 NOTE — Assessment & Plan Note (Signed)
 Encouraged diet and exercise for weight loss ?

## 2024-12-15 NOTE — Patient Instructions (Signed)
 Hypertension, Adult Hypertension is another name for high blood pressure. High blood pressure forces your heart to work harder to pump blood. This can cause problems over time. There are two numbers in a blood pressure reading. There is a top number (systolic) over a bottom number (diastolic). It is best to have a blood pressure that is below 120/80. What are the causes? The cause of this condition is not known. Some other conditions can lead to high blood pressure. What increases the risk? Some lifestyle factors can make you more likely to develop high blood pressure: Smoking. Not getting enough exercise or physical activity. Being overweight. Having too much fat, sugar, calories, or salt (sodium) in your diet. Drinking too much alcohol. Other risk factors include: Having any of these conditions: Heart disease. Diabetes. High cholesterol. Kidney disease. Obstructive sleep apnea. Having a family history of high blood pressure and high cholesterol. Age. The risk increases with age. Stress. What are the signs or symptoms? High blood pressure may not cause symptoms. Very high blood pressure (hypertensive crisis) may cause: Headache. Fast or uneven heartbeats (palpitations). Shortness of breath. Nosebleed. Vomiting or feeling like you may vomit (nauseous). Changes in how you see. Very bad chest pain. Feeling dizzy. Seizures. How is this treated? This condition is treated by making healthy lifestyle changes, such as: Eating healthy foods. Exercising more. Drinking less alcohol. Your doctor may prescribe medicine if lifestyle changes do not help enough and if: Your top number is above 130. Your bottom number is above 80. Your personal target blood pressure may vary. Follow these instructions at home: Eating and drinking  If told, follow the DASH eating plan. To follow this plan: Fill one half of your plate at each meal with fruits and vegetables. Fill one fourth of your plate  at each meal with whole grains. Whole grains include whole-wheat pasta, brown rice, and whole-grain bread. Eat or drink low-fat dairy products, such as skim milk or low-fat yogurt. Fill one fourth of your plate at each meal with low-fat (lean) proteins. Low-fat proteins include fish, chicken without skin, eggs, beans, and tofu. Avoid fatty meat, cured and processed meat, or chicken with skin. Avoid pre-made or processed food. Limit the amount of salt in your diet to less than 1,500 mg each day. Do not drink alcohol if: Your doctor tells you not to drink. You are pregnant, may be pregnant, or are planning to become pregnant. If you drink alcohol: Limit how much you have to: 0-1 drink a day for women. 0-2 drinks a day for men. Know how much alcohol is in your drink. In the [place] one drink equals one 12 oz bottle of beer (355 mL), one 5 oz glass of wine (148 mL), or one 1 oz glass of hard liquor (44 mL). Lifestyle  Work with your doctor to stay at a healthy weight or to lose weight. Ask your doctor what the best weight is for you. Get at least 30 minutes of exercise that causes your heart to beat faster (aerobic exercise) most days of the week. This may include walking, swimming, or biking. Get at least 30 minutes of exercise that strengthens your muscles (resistance exercise) at least 3 days a week. This may include lifting weights or doing Pilates. Do not smoke or use any products that contain nicotine or tobacco. If you need help quitting, ask your doctor. Check your blood pressure at home as told by your doctor. Keep all follow-up visits. Medicines Take over-the-counter and prescription medicines  only as told by your doctor. Follow directions carefully. Do not skip doses of blood pressure medicine. The medicine does not work as well if you skip doses. Skipping doses also puts you at risk for problems. Ask your doctor about side effects or reactions to medicines that you should watch  for. Contact a doctor if: You think you are having a reaction to the medicine you are taking. You have headaches that keep coming back. You feel dizzy. You have swelling in your ankles. You have trouble with your vision. Get help right away if: You get a very bad headache. You start to feel mixed up (confused). You feel weak or numb. You feel faint. You have very bad pain in your: Chest. Belly (abdomen). You vomit more than once. You have trouble breathing. These symptoms may be an emergency. Get help right away. Call 911. Do not wait to see if the symptoms will go away. Do not drive yourself to the hospital. Summary Hypertension is another name for high blood pressure. High blood pressure forces your heart to work harder to pump blood. For most people, a normal blood pressure is less than 120/80. Making healthy choices can help lower blood pressure. If your blood pressure does not get lower with healthy choices, you may need to take medicine. This information is not intended to replace advice given to you by your health care provider. Make sure you discuss any questions you have with your health care provider. Document Revised: 09/14/2021 Document Reviewed: 09/14/2021 Elsevier Patient Education  2024 [employer].

## 2024-12-23 ENCOUNTER — Other Ambulatory Visit

## 2024-12-23 DIAGNOSIS — Z006 Encounter for examination for normal comparison and control in clinical research program: Secondary | ICD-10-CM

## 2024-12-23 LAB — GENECONNECT MOLECULAR SCREEN

## 2024-12-29 NOTE — Addendum Note (Signed)
 Addended by: DELPHINE BRUNO HERO on: 12/29/2024 01:01 PM   Modules accepted: Orders

## 2025-01-10 LAB — GENECONNECT MOLECULAR SCREEN: Genetic Analysis Overall Interpretation: NEGATIVE

## 2025-05-27 ENCOUNTER — Encounter: Admitting: Internal Medicine
